# Patient Record
Sex: Male | Born: 1940 | Race: White | Hispanic: No | Marital: Single | Smoking: Never smoker
Health system: Southern US, Community
[De-identification: ages and names within clinical notes are randomized; demographics above are authoritative.]

## PROBLEM LIST (undated history)

## (undated) DIAGNOSIS — N183 Chronic kidney disease, stage 3 unspecified: Secondary | ICD-10-CM

## (undated) DIAGNOSIS — I2510 Atherosclerotic heart disease of native coronary artery without angina pectoris: Secondary | ICD-10-CM

## (undated) DIAGNOSIS — E785 Hyperlipidemia, unspecified: Secondary | ICD-10-CM

## (undated) DIAGNOSIS — I472 Ventricular tachycardia, unspecified: Secondary | ICD-10-CM

## (undated) DIAGNOSIS — Z9289 Personal history of other medical treatment: Secondary | ICD-10-CM

## (undated) DIAGNOSIS — E669 Obesity, unspecified: Secondary | ICD-10-CM

## (undated) DIAGNOSIS — D510 Vitamin B12 deficiency anemia due to intrinsic factor deficiency: Secondary | ICD-10-CM

## (undated) DIAGNOSIS — Z9889 Other specified postprocedural states: Secondary | ICD-10-CM

## (undated) DIAGNOSIS — M1990 Unspecified osteoarthritis, unspecified site: Secondary | ICD-10-CM

## (undated) DIAGNOSIS — K219 Gastro-esophageal reflux disease without esophagitis: Secondary | ICD-10-CM

## (undated) DIAGNOSIS — I493 Ventricular premature depolarization: Secondary | ICD-10-CM

## (undated) DIAGNOSIS — I10 Essential (primary) hypertension: Secondary | ICD-10-CM

## (undated) DIAGNOSIS — E7439 Other disorders of intestinal carbohydrate absorption: Secondary | ICD-10-CM

## (undated) HISTORY — DX: Personal history of other medical treatment: Z92.89

## (undated) HISTORY — DX: Vitamin B12 deficiency anemia due to intrinsic factor deficiency: D51.0

## (undated) HISTORY — DX: Ventricular tachycardia: I47.2

## (undated) HISTORY — DX: Other disorders of intestinal carbohydrate absorption: E74.39

## (undated) HISTORY — DX: Hyperlipidemia, unspecified: E78.5

## (undated) HISTORY — DX: Other specified postprocedural states: Z98.890

## (undated) HISTORY — DX: Unspecified osteoarthritis, unspecified site: M19.90

## (undated) HISTORY — DX: Ventricular premature depolarization: I49.3

## (undated) HISTORY — DX: Obesity, unspecified: E66.9

## (undated) HISTORY — DX: Ventricular tachycardia, unspecified: I47.20

## (undated) HISTORY — DX: Atherosclerotic heart disease of native coronary artery without angina pectoris: I25.10

## (undated) HISTORY — DX: Gastro-esophageal reflux disease without esophagitis: K21.9

## (undated) HISTORY — DX: Chronic kidney disease, stage 3 unspecified: N18.30

---

## 1998-07-16 ENCOUNTER — Encounter: Admission: RE | Admit: 1998-07-16 | Discharge: 1998-10-14 | Payer: Self-pay | Admitting: Family Medicine

## 2000-06-02 ENCOUNTER — Ambulatory Visit (HOSPITAL_BASED_OUTPATIENT_CLINIC_OR_DEPARTMENT_OTHER): Admission: RE | Admit: 2000-06-02 | Discharge: 2000-06-02 | Payer: Self-pay | Admitting: Orthopedic Surgery

## 2003-01-04 ENCOUNTER — Encounter: Admission: RE | Admit: 2003-01-04 | Discharge: 2003-01-04 | Payer: Self-pay | Admitting: Family Medicine

## 2003-01-19 ENCOUNTER — Ambulatory Visit (HOSPITAL_BASED_OUTPATIENT_CLINIC_OR_DEPARTMENT_OTHER): Admission: RE | Admit: 2003-01-19 | Discharge: 2003-01-19 | Payer: Self-pay | Admitting: Neurosurgery

## 2003-01-19 ENCOUNTER — Ambulatory Visit (HOSPITAL_COMMUNITY): Admission: RE | Admit: 2003-01-19 | Discharge: 2003-01-19 | Payer: Self-pay | Admitting: Neurosurgery

## 2003-06-18 ENCOUNTER — Ambulatory Visit (HOSPITAL_COMMUNITY): Admission: RE | Admit: 2003-06-18 | Discharge: 2003-06-18 | Payer: Self-pay | Admitting: Gastroenterology

## 2003-06-18 ENCOUNTER — Encounter (INDEPENDENT_AMBULATORY_CARE_PROVIDER_SITE_OTHER): Payer: Self-pay | Admitting: *Deleted

## 2003-06-18 HISTORY — PX: ESOPHAGOGASTRODUODENOSCOPY: SHX1529

## 2003-07-13 ENCOUNTER — Ambulatory Visit (HOSPITAL_COMMUNITY): Admission: RE | Admit: 2003-07-13 | Discharge: 2003-07-13 | Payer: Self-pay | Admitting: Gastroenterology

## 2003-08-21 ENCOUNTER — Ambulatory Visit (HOSPITAL_COMMUNITY): Admission: RE | Admit: 2003-08-21 | Discharge: 2003-08-21 | Payer: Self-pay | Admitting: Gastroenterology

## 2008-03-05 ENCOUNTER — Inpatient Hospital Stay (HOSPITAL_BASED_OUTPATIENT_CLINIC_OR_DEPARTMENT_OTHER): Admission: RE | Admit: 2008-03-05 | Discharge: 2008-03-05 | Payer: Self-pay | Admitting: *Deleted

## 2008-03-30 ENCOUNTER — Ambulatory Visit: Payer: Self-pay | Admitting: Internal Medicine

## 2008-05-24 DIAGNOSIS — F329 Major depressive disorder, single episode, unspecified: Secondary | ICD-10-CM | POA: Insufficient documentation

## 2008-05-24 DIAGNOSIS — R55 Syncope and collapse: Secondary | ICD-10-CM | POA: Insufficient documentation

## 2008-05-24 DIAGNOSIS — F3289 Other specified depressive episodes: Secondary | ICD-10-CM | POA: Insufficient documentation

## 2008-05-24 DIAGNOSIS — I493 Ventricular premature depolarization: Secondary | ICD-10-CM | POA: Insufficient documentation

## 2008-05-24 DIAGNOSIS — I4729 Other ventricular tachycardia: Secondary | ICD-10-CM | POA: Insufficient documentation

## 2008-05-24 DIAGNOSIS — I10 Essential (primary) hypertension: Secondary | ICD-10-CM | POA: Insufficient documentation

## 2008-05-24 DIAGNOSIS — E119 Type 2 diabetes mellitus without complications: Secondary | ICD-10-CM | POA: Insufficient documentation

## 2008-05-24 DIAGNOSIS — I472 Ventricular tachycardia: Secondary | ICD-10-CM | POA: Insufficient documentation

## 2008-05-24 DIAGNOSIS — I4949 Other premature depolarization: Secondary | ICD-10-CM | POA: Insufficient documentation

## 2008-05-28 ENCOUNTER — Encounter: Payer: Self-pay | Admitting: Internal Medicine

## 2008-05-28 ENCOUNTER — Ambulatory Visit: Payer: Self-pay | Admitting: Internal Medicine

## 2008-10-11 DIAGNOSIS — F0630 Mood disorder due to known physiological condition, unspecified: Secondary | ICD-10-CM | POA: Insufficient documentation

## 2008-11-08 DIAGNOSIS — N529 Male erectile dysfunction, unspecified: Secondary | ICD-10-CM | POA: Insufficient documentation

## 2008-11-12 DIAGNOSIS — D510 Vitamin B12 deficiency anemia due to intrinsic factor deficiency: Secondary | ICD-10-CM | POA: Insufficient documentation

## 2008-12-20 DIAGNOSIS — D508 Other iron deficiency anemias: Secondary | ICD-10-CM | POA: Insufficient documentation

## 2008-12-27 ENCOUNTER — Encounter: Admission: RE | Admit: 2008-12-27 | Discharge: 2008-12-27 | Payer: Self-pay | Admitting: Family Medicine

## 2009-02-04 DIAGNOSIS — M5412 Radiculopathy, cervical region: Secondary | ICD-10-CM | POA: Insufficient documentation

## 2009-02-04 DIAGNOSIS — R6882 Decreased libido: Secondary | ICD-10-CM | POA: Insufficient documentation

## 2009-02-04 DIAGNOSIS — M5030 Other cervical disc degeneration, unspecified cervical region: Secondary | ICD-10-CM | POA: Insufficient documentation

## 2009-07-16 DIAGNOSIS — D649 Anemia, unspecified: Secondary | ICD-10-CM | POA: Insufficient documentation

## 2009-10-22 DIAGNOSIS — J309 Allergic rhinitis, unspecified: Secondary | ICD-10-CM | POA: Insufficient documentation

## 2009-10-22 DIAGNOSIS — E119 Type 2 diabetes mellitus without complications: Secondary | ICD-10-CM | POA: Insufficient documentation

## 2010-03-18 NOTE — Assessment & Plan Note (Signed)
Summary: Karl Peters  Medications Added COREG 12.5 MG TABS (CARVEDILOL) Take 1 tablet twice a day       21  22  23  24  25  Allergies Added: NKDA  Visit Type:  Follow-up Visit Referring Provider:  Lady Deutscher, MD  CC:  PVCS.  History of Present Illness: [name] presents today for follow-up.  He has done very well since last being seen in our office.  He denies palpitations, dizziness, chest pain, SOB, nausea, presyncope, syncope, or any other symptoms of PVCs.  He feels that his PVC burden has significantly improved with coreg.  He is unaware of any further VT.  He is tolerating coreg without symptoms.  He is otherwise without complaint today.  Current Medications (verified): 1)  Avandamet 03-998 Mg Tabs (Rosiglitazone-Metformin) .Marland Kitchen.. 1 By Mouth Once Daily 2)  Crestor 20 Mg Tabs (Rosuvastatin Calcium) .... Take One Tablet By Mouth Daily. 3)  Bupropion Hcl 300 Mg Xr24h-Tab (Bupropion Hcl) .Marland Kitchen.. 1 By Mouth Once Daily 4)  Lisinopril 40 Mg Tabs (Lisinopril) .... Take One Tablet By Mouth Daily 5)  Furosemide 20 Mg Tabs (Furosemide) .... Take One Tablet By Mouth Two Times A Day 6)  Aspirin Ec 325 Mg Tbec (Aspirin) .... Take One Tablet By Mouth Daily 7)  Coreg 12.5 Mg Tabs (Carvedilol) .... Take 1 Tablet Twice A Day  Allergies (verified): No Known Drug Allergies  Past History:  Past Medical History:    1. 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 Hypertension.    3. Preserved ejection fraction.    4. Diabetes.    5. Vasovagal syncope.    6. Depression.   Family History:    Notable for coronary artery disease and congestive  heart failure.  He denies any family history of sudden cardiac death or  arrhythmias.   Social History:    The patient lives in [place] and works for [employer].  He denies tobacco, alcohol, or drug use.   Review of Systems       All systems are reviewed and negative except as listed in the HPI.   Vital  Signs:  Patient profile:   70 year old male Height:      68 inches Weight:      286 pounds BMI:     43.64 Pulse rate:   62 / minute Pulse rhythm:   regular BP sitting:   130 / 82  (left arm) Cuff size:   regular  Vitals Entered By: Flonnie Overman (May 28, 2008 9:52 AM)  Physical Exam  General:  Well developed, well nourished, in no acute distress. Head:  normocephalic and atraumatic Eyes:  PERRLA/EOM intact; conjunctiva and lids normal. Nose:  no deformity, discharge, inflammation, or lesions Mouth:  Teeth, gums and palate normal. Oral mucosa normal. Neck:  Neck supple, no JVD. No masses, thyromegaly or abnormal cervical nodes. Chest Wall:  no deformities or breast masses noted Lungs:  Clear bilaterally to auscultation and percussion. Heart:  Non-displaced PMI, chest non-tender; regular rate and rhythm, S1, S2 without murmurs, rubs or gallops. Carotid upstroke normal, no bruit. Normal abdominal aortic size, no bruits. Femorals normal pulses, no bruits. Pedals normal pulses. No edema, no varicosities. Abdomen:  Bowel sounds positive; abdomen soft and non-tender without masses, organomegaly, or hernias noted. No hepatosplenomegaly. Msk:  Back normal, normal gait. Muscle strength and tone normal. Pulses:  pulses normal in all 4 extremities Extremities:  No clubbing or cyanosis. Neurologic:  Alert and oriented x  3. Skin:  Intact without lesions or rashes. Cervical Nodes:  no significant adenopathy Psych:  Normal affect.   EKG  Procedure date:  05/28/2008  Findings:      Sinus rhythm at 62 bpm,  LAD, otherwise normal  Impression & Recommendations:  Problem # 1:  PREMATURE VENTRICULAR CONTRACTIONS (ICD-427.69) Mr Hevener presents today for follow-up of PVCS and nonsustained VT.  He has had no further symptomatic ventricular arrhythmias since last being seen in our office.  He is tolerating Coreg without difficulties.  No medication changes were made today.  No further EP workup or  therapies appear to be necessary at this time.  Problem # 2:  HYPERTENSION, UNSPECIFIED (ICD-401.9) The patients blood pressure is near goal.  No medication changes were made today.  Patient Instructions: 1)  The patient will resume follow-up with Dr Karl Peters.  He will contact my office if further difficulties with ventricular arrhythmias arise.

## 2010-06-02 LAB — POCT I-STAT GLUCOSE
Glucose, Bld: 90 mg/dL (ref 70–99)
Operator id: 141321

## 2010-07-01 NOTE — Letter (Signed)
March 30, 2008    Rosine Abe, M.D.  [address]  [place], [place] [address]   RE:  BRITIAN, SEABERG  MRN:  [id number]  /  DOB:  06/25/1940   Dear Hartley Barefoot:   It was my pleasure to see your patient Kristin Rossiter in electrophysiology  consultation today.  As you recall, he is a very pleasant 70 year old  gentleman with a history of diabetes, hypertension, and sleep apnea.  He  recently has developed symptomatic palpitations and has been diagnosed  with ventricular tachycardia.  He reports that for more than 8 years he  has had symptomatic palpitations, which are suggestive of PVCs.  He  describes skipped heartbeats followed by forceful beat.  These episodes  previously occurred only every 6 months of so.  Over the past few  months, however, he has had increasing frequency and duration of  palpitations.  He has also developed episodes of chest fluttering with  associated chest discomfort and weakness.  He denies presyncope or  syncope with these episodes.  He has had syncope in the past.  However,  these episodes seem clearly vasovagal in origin.  For example, he has  had syncope while giving blood in the past as well as during a bowel  movement in the setting of severe abdominal pain.  He was evaluated by  you and had a event monitor placed, which previously documented  premature ventricular contractions, nonsustained ventricular tachycardia  as well as one episode of sustained ventricular tachycardia with a cycle  length of 180 milliseconds.  He was initiated on carvedilol, with some  improvement in the frequency.  He presents today for further EP  consultation.  A left heart catheterization has been performed on  March 05, 2008, which revealed no coronary artery disease with a  preserved ejection fraction of 60%.   PAST MEDICAL HISTORY:  1. 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  2. Hypertension.  3. Preserved ejection fraction.  4. Diabetes.  5. Vasovagal syncope.  6. Depression.   ALLERGIES:  No known drug allergies.   MEDICATIONS:  1. Avandamet 2 mg daily.  2. Crestor 20 mg daily.  3. Carvedilol 6.25 mg b.i.d.  4. Wellbutrin XL 300 mg daily.  5. Lisinopril 40 mg daily.  6. Lasix 20 mg b.i.d.  7. Aspirin 325 mg daily.   SOCIAL HISTORY:  The patient lives in [place] and works for [employer].  He denies tobacco, alcohol, or drug use.   FAMILY HISTORY:  Notable for coronary artery disease and congestive  heart failure.  He denies any family history of sudden cardiac death or  arrhythmias.   REVIEW OF SYSTEMS:  All systems are reviewed and negative except as  outlined in the HPI above.   PHYSICAL EXAMINATION:  VITAL SIGNS:  Blood pressure 135/86, heart rate  67, respirations 18, and weight 280 pounds.  GENERAL:  The patient is a well-appearing male, in no acute distress.  He is alert and oriented x3.  HEENT:  Normocephalic and atraumatic.  Sclerae clear.  Conjunctivae  pink.  Oropharynx clear.  NECK:  Supple.  No thyromegaly, JVD, or bruits.  LUNGS:  Clear to auscultation bilaterally  HEART:  Regular rate and rhythm.  No murmurs, rubs, or gallops.  GI:  Soft, nontender, and nondistended.  Positive bowel sounds.  EXTREMITIES:  No clubbing, cyanosis, or edema.  NEUROLOGIC:  Strength and sensation are intact.  SKIN:  No ecchymosis or  lacerations.  MUSCULOSKELETAL:  No deformity or atrophy.  PSYCH:  Euthymic mood.  Full affect.   EKG performed on February 24, 2008 reveals sinus rhythm at 75 beats per  minute with left axis deviation.   IMPRESSION:  Mr. Kort is a pleasant 70 year old gentleman with a  preserved ejection fraction and no known coronary artery disease who  presents with symptomatic premature ventricular contractions and  ventricular tachycardia.  He has been initiated on Coreg with  significant improvement in his symptoms.  Holter monitor also documented  a decrease in the frequency  of his ventricular tachycardia.  I think  that the most prudent step therefore at this time would be to increase  his Coreg further to 12.5 mg twice daily.  If he continues to have  further symptomatic episodes of ventricular tachycardia then we could  consider catheter ablation.  Risks, benefits and alternatives to EP  study and radiofrequency ablation, for his ventricular tachycardia were  discussed in detail.  The patient would prefer a medicine strategy at  this time.   PLAN:  1. Carvedilol was increased 12.5 mg twice daily.  2. The patient will follow up with me in clinic in 2 months.    Sincerely,      [name], MD  Electronically Signed    JA/MedQ  DD: 03/30/2008  DT: 03/31/2008  Job #: [id number]

## 2010-07-01 NOTE — H&P (Signed)
NAME:  Karl Peters, Karl Peters NO.:  [id number]   MEDICAL RECORD NO.:  [id number]          PATIENT TYPE:  OIB   LOCATION:  1966                         FACILITY:  [facility]   PHYSICIAN:  Elmore Guise., M.D.DATE OF BIRTH:  1940-12-21   DATE OF ADMISSION:  03/05/2008  DATE OF DISCHARGE:  03/05/2008                              HISTORY & PHYSICAL   INDICATION FOR PROCEDURE:  Ventricular tachycardia.   HISTORY OF PRESENT ILLNESS:  Karl Peters is a very pleasant 70 year old  white male with past medical history of diabetes mellitus, hypertension,  and sleep apnea, who presented for evaluation of chest pain and  palpitations.  He had an echocardiogram performed, which showed normal  LV size and function with an EF of 60%.  He had mild to moderate left  ventricular hypertrophy.  He was then referred for Holter monitoring.  His Holter monitor was performed and showed episodes of ventricular  tachycardia with the longest run being 241 beats with a rate of 189  beats per minute.  The patient was started on Coreg 3.125 mg twice daily  with significant improvement in his symptoms.  Because of his  arrhythmias, he is now referred for cardiac catheterization to evaluate  for any significant ischemic heart disease.   DESCRIPTION OF PROCEDURE:  The patient presented to the Cardiac Cath  Lab.  After appropriate informed consent, he was prepped and draped in  the sterile fashion.  Approximately 5 mL of 1% lidocaine was used for  local anesthesia.  A 4-French sheath was placed in the right femoral  artery without difficulty.  Coronary angiography, LV angiography were  then performed.  The patient tolerated the procedure well and was  transferred from the Cardiac Cath Lab in stable condition.   FINDINGS:  1. Left main:  Normal.  2. LAD:  Normal.  3. LCX:  Codominant large vessel, normal appearing  4. High OM:  Large vessel, normal appearing  5. RCA:  Small to moderate size vessel,  codominant, normal appearing.  6. LV:  EF of 60%.  No wall motion abnormalities.  LVEDP is 25 mmHg.   IMPRESSION:  1. Normal-appearing coronary arteries with codominant system.  2. Preserved left ventricular systolic function with ejection fraction      of 60%.   PLAN:  We will continue his current therapy.  The patient does report  significant improvement in his palpitations and chest discomfort since  the addition of Coreg to his regimen.  I plan to repeat Holter in 1  week.  If he has no further ventricular arrhythmias, we will then  continue his Coreg.  If he does have further ventricular arrhythmias, we  will refer him to Dr. Johney Frame (electrophysiologist) for evaluation of  possible ablation.      Elmore Guise., M.D.  Electronically Signed     TWK/MEDQ  D:  03/05/2008  T:  03/05/2008  Job:  [id number]   cc:   Elizabeth Palau, FNP

## 2010-07-04 NOTE — Op Note (Signed)
NAME:  Karl Peters, Karl Peters                             ACCOUNT NO.:  [id number]   MEDICAL RECORD NO.:  [id number]                   PATIENT TYPE:  AMB   LOCATION:  DSC                                  FACILITY:  [facility]   PHYSICIAN:  Reinaldo Meeker, M.D.              DATE OF BIRTH:  Aug 30, 1940   DATE OF PROCEDURE:  01/19/2003  DATE OF DISCHARGE:                                 OPERATIVE REPORT   PREOPERATIVE DIAGNOSIS:  Right carpal tunnel syndrome.   POSTOPERATIVE DIAGNOSIS:  Right carpal tunnel syndrome.   PROCEDURE:  Right carpal tunnel release.   SURGEON:  Reinaldo Meeker, M.D.   PROCEDURE IN DETAIL:  After induction of regional anesthesia the patient's  hand, wrist, and forearm were prepped and draped in the usual sterile  fashion.  A curvilinear incision was made starting at the list in line with  the ring finger, heading up toward the fingers, then curving slightly in a  radial direction.  Subcutaneous fat was dissected.  Self-retaining  retractors were placed for exposure.  The transverse carpal ligament was  then incised.  Starting in a proximal to distal direction it was incised to  decompress the underlying median nerve, which was well-visualized.  A very  thorough decompression was carried out until there was no evidence of  further compression upon the nerve as it went through the carpal tunnel.  Irrigation was then carried out and the wound closed with interrupted Vicryl  on the subcutaneous tissue and interrupted nylons on the skin.  A sterile  dressing was then applied and the patient was taken to the recovery room in  stable condition.                                               Reinaldo Meeker, M.D.    ROK/MEDQ  D:  01/19/2003  T:  01/19/2003  Job:  [id number]

## 2010-07-04 NOTE — Op Note (Signed)
NAME:  Karl Peters, Karl Peters NO.:  [id number]   MEDICAL RECORD NO.:  [id number]                   PATIENT TYPE:  AMB   LOCATION:  ENDO                                 FACILITY:  [facility]   PHYSICIAN:  Anselmo Rod, M.D.               DATE OF BIRTH:  01/21/1941   DATE OF PROCEDURE:  06/18/2003  DATE OF DISCHARGE:                                 OPERATIVE REPORT   PROCEDURE PERFORMED:  Esophagogastroduodenoscopy with biopsies.   ENDOSCOPIST:  Charna Elizabeth, M.D.   INSTRUMENT USED:  Olympus video panendoscope.   INDICATIONS FOR PROCEDURE:  This is a 70 year old white male with a history  of iron deficiency anemia, hemoglobin down to 9.9 g/dl.  Rule out peptic  ulcer disease, esophagitis, gastritis, etc.   PROCEDURE PREPARATION:  Informed consent was procured from the patient.  The  patient was fasted for eight hours prior to the procedure.   PREPROCEDURE PHYSICAL EXAMINATION:  VITAL SIGNS:  The patient has stable  vital signs.  NECK:  Supple.  CHEST:  Clear to auscultation.  HEART:  S1 and S2 regular.  ABDOMEN:  Soft with normal bowel sounds.   DESCRIPTION OF PROCEDURE:  The patient was placed in the left lateral  decubitus position and sedated with 60 mg of Demerol and 6 mg of Versed  intravenously.  Once the patient was adequately sedated and maintained on  low flow oxygen and continuous cardiac monitoring, the Olympus video  panendoscope was advanced through the mouthpiece, over the tongue, into the  esophagus under direct vision.  The entire esophagus appeared normal with no  evidence of ring, stricture, masses, esophagitis or Barrett's mucosa.  A  small nodule was biopsied from the mid body of the stomach x1.  There was  some bleeding from the site, and therefore, no further biopsies were done.  Small bowel biopsies were done to rule out sprue considering the patient's  history of iron deficiency anemia and no lesions were noted in the small  bowel.   No ulcers, erosions, masses, or polyps were identified.  Retroflexion in the high cardia revealed no further masses.   IMPRESSION:  1. Normal-appearing esophagus and proximal small bowel.  2. Small nodular biopsy from the stomach x1.  3. Small bowel biopsy to rule out sprue.   RECOMMENDATIONS:  1. Await pathology results.  2. Avoid nonsteroidals for now.  3. Proceed with colonoscopy at this time.  4. Further recommendations will be made in followup.                                               Anselmo Rod, M.D.    JNM/MEDQ  D:  06/18/2003  T:  06/18/2003  Job:  [id number]   cc:   Marjory Lies,  M.D.  [address]  [place]  [place] [address]  Fax: [phone]

## 2010-07-04 NOTE — Op Note (Signed)
NAME:  Karl Peters, Karl Peters NO.:  [id number]   MEDICAL RECORD NO.:  [id number]                   PATIENT TYPE:  AMB   LOCATION:  ENDO                                 FACILITY:  [facility]   PHYSICIAN:  Anselmo Rod, M.D.               DATE OF BIRTH:  March 11, 1940   DATE OF PROCEDURE:  06/18/2003  DATE OF DISCHARGE:                                 OPERATIVE REPORT   PROCEDURE:  Colonoscopy.   ENDOSCOPIST:  Charna Elizabeth, M.D.   INSTRUMENT USED:  Olympus video colonoscope.   INDICATIONS FOR PROCEDURE:  Iron deficiency anemia in a 70 year old white  male.  Rule out colonic polyps, masses, etc.   PROCEDURE PERFORMED:  Informed consent was procured from the patient.  The  patient fasted for eight hours prior to the procedure and prepped with a  bottle of magnesium citrate and a gallon of GOLYTELY the night prior to the  procedure.   PREPROCEDURE PHYSICAL EXAMINATION:  VITAL SIGNS:  The patient had stable  vital signs.  NECK:  Supple.  CHEST:  Clear to auscultation.  HEART:  S1 and S2 regular.  ABDOMEN:  Soft with normal bowel sounds.   DESCRIPTION OF PROCEDURE:  The patient was placed in the left lateral  decubitus position.  No additional sedation was used after the EGD.  Once  the patient was adequately sedated and maintained on low flow oxygen and  continuous cardiac monitoring, the Olympus video colonoscope was advanced  from the rectum to the cecum.  The appendiceal orifice and ileocecal valve  were clearly visualized and photographed.  The terminal ileum appeared  healthy without lesions.  No masses, polyps, erosions or diverticula were  seen.  There was no evidence of hemorrhoids on retroflexion.  The patient  tolerated the procedure well without any complications.   IMPRESSION:  Normal colonoscopy to the terminal ileum. No masses, polyps,  diverticula or hemorrhoids noted.   RECOMMENDATIONS:  1. Check CBC today.  2. Enteroclysis to rule out small  bowel lesions.  3. Outpatient followup in the next two weeks; earlier if need be.                                               Anselmo Rod, M.D.    JNM/MEDQ  D:  06/18/2003  T:  06/18/2003  Job:  [id number]   cc:   Marjory Lies, M.D.  [address]  [place]  [place] [address]  Fax: [phone]

## 2010-07-04 NOTE — Op Note (Signed)
[place]  Patient:    ISHMEL, ACEVEDO                          MRN: [id number]. Date: 06/02/00 Adm. 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  PROCEDURE: 1. Release A1 pulley right thumb. 2. Inject CMC joint with Kenalog and Marcaine.  SURGEON:  Artist Pais. Mina Marble, M.D.  ASSISTANT:  Junius Roads. Ireton, P.A.C.  ANESTHESIA:  Bier block.  TOURNIQUET TIME:  20 minutes.  COMPLICATIONS:  None.  DRAINS:  None.  OPERATIVE REPORT:  The patient was taken to the operating room where after the induction of adequate Bier block analgesia the right upper extremity was prepped and draped in the usual sterile fashion.  An Esmarch was used to exsanguinate the limb, tourniquet inflated to 250 mmHg.  At this point in time a 2 cm incision was made in the palmar aspect of the hand in the area of the MP flexion crease of the thumb.  The incision was taken down through the skin and subcutaneous tissues with care to carefully identify and retract the radial and ulnar neurovascular bundles.  Once this was done the A1 pulley was identified, split longitudinally with a 15 blade, thus freeing up the FPL tendon.  The FPL tendon was lysed of all adhesions and the wound was throught irrigated and closed with 4-0 nylon in a combination of simple and horizontal mattress sutures.  Sterile dressing of Xeroform, 4x4s, fluffs was applied. Prior to the wound closure and application of dressing, the [facility] joint was injected with 1 cc of Marcaine and 1 cc of Kenalog 40.  The patient tolerated the procedure well, went to recovery in stable fashion. DD:  06/02/00 TD:  06/02/00 Job: [id number]

## 2010-11-12 DIAGNOSIS — E349 Endocrine disorder, unspecified: Secondary | ICD-10-CM | POA: Insufficient documentation

## 2011-06-30 ENCOUNTER — Encounter: Payer: Self-pay | Admitting: *Deleted

## 2011-08-28 DIAGNOSIS — R110 Nausea: Secondary | ICD-10-CM | POA: Insufficient documentation

## 2011-10-26 ENCOUNTER — Encounter: Payer: Self-pay | Admitting: *Deleted

## 2012-06-09 ENCOUNTER — Encounter (HOSPITAL_COMMUNITY): Payer: Self-pay | Admitting: *Deleted

## 2012-06-09 ENCOUNTER — Emergency Department (HOSPITAL_COMMUNITY): Payer: Medicare Other

## 2012-06-09 ENCOUNTER — Observation Stay (HOSPITAL_COMMUNITY)
Admission: EM | Admit: 2012-06-09 | Discharge: 2012-06-11 | Disposition: A | Discharge status: Home / Self Care | Payer: Medicare Other | Attending: Internal Medicine | Admitting: Internal Medicine

## 2012-06-09 DIAGNOSIS — R079 Chest pain, unspecified: Secondary | ICD-10-CM

## 2012-06-09 DIAGNOSIS — I493 Ventricular premature depolarization: Secondary | ICD-10-CM

## 2012-06-09 DIAGNOSIS — I10 Essential (primary) hypertension: Secondary | ICD-10-CM

## 2012-06-09 DIAGNOSIS — I4729 Other ventricular tachycardia: Secondary | ICD-10-CM

## 2012-06-09 DIAGNOSIS — Z6841 Body Mass Index (BMI) 40.0 and over, adult: Secondary | ICD-10-CM | POA: Insufficient documentation

## 2012-06-09 DIAGNOSIS — E119 Type 2 diabetes mellitus without complications: Secondary | ICD-10-CM

## 2012-06-09 DIAGNOSIS — I472 Ventricular tachycardia, unspecified: Principal | ICD-10-CM | POA: Insufficient documentation

## 2012-06-09 DIAGNOSIS — E785 Hyperlipidemia, unspecified: Secondary | ICD-10-CM | POA: Insufficient documentation

## 2012-06-09 DIAGNOSIS — A088 Other specified intestinal infections: Secondary | ICD-10-CM | POA: Insufficient documentation

## 2012-06-09 DIAGNOSIS — E669 Obesity, unspecified: Secondary | ICD-10-CM | POA: Insufficient documentation

## 2012-06-09 DIAGNOSIS — I519 Heart disease, unspecified: Secondary | ICD-10-CM | POA: Insufficient documentation

## 2012-06-09 DIAGNOSIS — R0789 Other chest pain: Secondary | ICD-10-CM | POA: Diagnosis present

## 2012-06-09 DIAGNOSIS — I959 Hypotension, unspecified: Secondary | ICD-10-CM | POA: Diagnosis present

## 2012-06-09 HISTORY — DX: Essential (primary) hypertension: I10

## 2012-06-09 LAB — CBC WITH DIFFERENTIAL/PLATELET
Basophils Absolute: 0 10*3/uL (ref 0.0–0.1)
Basophils Relative: 0 % (ref 0–1)
Eosinophils Absolute: 0 10*3/uL (ref 0.0–0.7)
Eosinophils Relative: 0 % (ref 0–5)
HCT: 39.9 % (ref 39.0–52.0)
Hemoglobin: 12.8 g/dL — ABNORMAL LOW (ref 13.0–17.0)
Lymphocytes Relative: 10 % — ABNORMAL LOW (ref 12–46)
Lymphs Abs: 0.8 10*3/uL (ref 0.7–4.0)
MCH: 23.3 pg — ABNORMAL LOW (ref 26.0–34.0)
MCHC: 32.1 g/dL (ref 30.0–36.0)
MCV: 72.5 fL — ABNORMAL LOW (ref 78.0–100.0)
Monocytes Absolute: 0.5 10*3/uL (ref 0.1–1.0)
Monocytes Relative: 6 % (ref 3–12)
Neutro Abs: 6.9 10*3/uL (ref 1.7–7.7)
Neutrophils Relative %: 84 % — ABNORMAL HIGH (ref 43–77)
Platelets: 273 10*3/uL (ref 150–400)
RBC: 5.5 MIL/uL (ref 4.22–5.81)
RDW: 18.2 % — ABNORMAL HIGH (ref 11.5–15.5)
WBC: 8.3 10*3/uL (ref 4.0–10.5)

## 2012-06-09 LAB — POCT I-STAT, CHEM 8
BUN: 16 mg/dL (ref 6–23)
Calcium, Ion: 1.12 mmol/L — ABNORMAL LOW (ref 1.13–1.30)
Chloride: 104 mEq/L (ref 96–112)
Creatinine, Ser: 1.1 mg/dL (ref 0.50–1.35)
Glucose, Bld: 143 mg/dL — ABNORMAL HIGH (ref 70–99)
HCT: 43 % (ref 39.0–52.0)
Hemoglobin: 14.6 g/dL (ref 13.0–17.0)
Potassium: 3.9 mEq/L (ref 3.5–5.1)
Sodium: 139 mEq/L (ref 135–145)
TCO2: 23 mmol/L (ref 0–100)

## 2012-06-09 LAB — URINALYSIS, ROUTINE W REFLEX MICROSCOPIC
Bilirubin Urine: NEGATIVE
Glucose, UA: NEGATIVE mg/dL
Hgb urine dipstick: NEGATIVE
Ketones, ur: NEGATIVE mg/dL
Leukocytes, UA: NEGATIVE
Nitrite: NEGATIVE
Protein, ur: NEGATIVE mg/dL
Specific Gravity, Urine: 1.02 (ref 1.005–1.030)
Urobilinogen, UA: 0.2 mg/dL (ref 0.0–1.0)
pH: 5 (ref 5.0–8.0)

## 2012-06-09 LAB — RAPID URINE DRUG SCREEN, HOSP PERFORMED
Amphetamines: NOT DETECTED
Barbiturates: NOT DETECTED
Benzodiazepines: NOT DETECTED
Cocaine: NOT DETECTED
Opiates: NOT DETECTED
Tetrahydrocannabinol: NOT DETECTED

## 2012-06-09 LAB — PROTIME-INR
INR: 1.05 (ref 0.00–1.49)
Prothrombin Time: 13.6 seconds (ref 11.6–15.2)

## 2012-06-09 LAB — TROPONIN I
Troponin I: <0.3 ng/mL (ref <0.30)
Troponin I: <0.3 ng/mL (ref <0.30)

## 2012-06-09 LAB — GLUCOSE, CAPILLARY: Glucose-Capillary: 127 mg/dL — ABNORMAL HIGH (ref 70–99)

## 2012-06-09 LAB — APTT: aPTT: 32 seconds (ref 24–37)

## 2012-06-09 MED ORDER — ONDANSETRON HCL 4 MG PO TABS
4.0000 mg | ORAL_TABLET | Freq: Four times a day (QID) | ORAL | Status: DC | PRN
  Administered 2012-06-10: 4 mg via ORAL
  Filled 2012-06-09: qty 1

## 2012-06-09 MED ORDER — FUROSEMIDE 20 MG PO TABS
20.0000 mg | ORAL_TABLET | Freq: Every day | ORAL | Status: DC
  Administered 2012-06-10 – 2012-06-11 (×2): 20 mg via ORAL
  Filled 2012-06-09 (×3): qty 1

## 2012-06-09 MED ORDER — ENOXAPARIN SODIUM 40 MG/0.4ML ~~LOC~~ SOLN
40.0000 mg | SUBCUTANEOUS | Status: DC
  Administered 2012-06-09 – 2012-06-10 (×2): 40 mg via SUBCUTANEOUS
  Filled 2012-06-09 (×3): qty 0.4

## 2012-06-09 MED ORDER — ACETAMINOPHEN 650 MG RE SUPP: 650.0000 mg | Freq: Four times a day (QID) | RECTAL | Status: DC | PRN

## 2012-06-09 MED ORDER — SODIUM CHLORIDE 0.9 % IJ SOLN
3.0000 mL | Freq: Two times a day (BID) | INTRAMUSCULAR | Status: DC
  Administered 2012-06-09 – 2012-06-11 (×4): 3 mL via INTRAVENOUS

## 2012-06-09 MED ORDER — ASPIRIN 325 MG PO TABS
325.0000 mg | ORAL_TABLET | Freq: Every day | ORAL | Status: DC
  Administered 2012-06-09 – 2012-06-11 (×3): 325 mg via ORAL
  Filled 2012-06-09 (×3): qty 1

## 2012-06-09 MED ORDER — ACETAMINOPHEN 325 MG PO TABS: 650.0000 mg | ORAL_TABLET | Freq: Four times a day (QID) | ORAL | Status: DC | PRN

## 2012-06-09 MED ORDER — PANTOPRAZOLE SODIUM 40 MG PO TBEC
40.0000 mg | DELAYED_RELEASE_TABLET | Freq: Every day | ORAL | Status: DC
Start: 2012-06-09 — End: 2012-06-11
  Administered 2012-06-09 – 2012-06-11 (×3): 40 mg via ORAL
  Filled 2012-06-09 (×3): qty 1

## 2012-06-09 MED ORDER — IOHEXOL 350 MG/ML SOLN
100.0000 mL | Freq: Once | INTRAVENOUS | Status: AC | PRN
  Administered 2012-06-09: 100 mL via INTRAVENOUS

## 2012-06-09 MED ORDER — SODIUM CHLORIDE 0.9 % IV SOLN
Freq: Once | INTRAVENOUS | Status: AC
  Administered 2012-06-09: 12:00:00 via INTRAVENOUS

## 2012-06-09 MED ORDER — SODIUM CHLORIDE 0.9 % IV SOLN: INTRAVENOUS | Status: DC

## 2012-06-09 MED ORDER — INSULIN ASPART 100 UNIT/ML ~~LOC~~ SOLN
0.0000 [IU] | Freq: Three times a day (TID) | SUBCUTANEOUS | Status: DC
  Administered 2012-06-09 – 2012-06-11 (×3): 1 [IU] via SUBCUTANEOUS

## 2012-06-09 MED ORDER — BUPROPION HCL ER (XL) 300 MG PO TB24
300.0000 mg | ORAL_TABLET | Freq: Every day | ORAL | Status: DC
  Administered 2012-06-09 – 2012-06-11 (×3): 300 mg via ORAL
  Filled 2012-06-09 (×3): qty 1

## 2012-06-09 MED ORDER — ONDANSETRON HCL 4 MG/2ML IJ SOLN
4.0000 mg | Freq: Once | INTRAMUSCULAR | Status: AC
  Administered 2012-06-09: 4 mg via INTRAVENOUS
  Filled 2012-06-09: qty 2

## 2012-06-09 MED ORDER — ONDANSETRON HCL 4 MG/2ML IJ SOLN
4.0000 mg | Freq: Four times a day (QID) | INTRAMUSCULAR | Status: DC | PRN
  Administered 2012-06-10 (×2): 4 mg via INTRAVENOUS
  Filled 2012-06-09 (×2): qty 2

## 2012-06-09 MED ORDER — ATORVASTATIN CALCIUM 40 MG PO TABS
40.0000 mg | ORAL_TABLET | Freq: Every day | ORAL | Status: DC
  Administered 2012-06-10 – 2012-06-11 (×2): 40 mg via ORAL
  Filled 2012-06-09 (×3): qty 1

## 2012-06-09 NOTE — ED Notes (Signed)
Pt states, "I feel like I can pass out." BP 72/46, manual BP obtained 72/58, Karl Meeker, MD notified at bed, 6 min EKG strip printed & shown to MD, pt placed in stretcher with head down, pt receiving NS bolus

## 2012-06-09 NOTE — ED Provider Notes (Signed)
History     CSN: [id number]  Arrival date & time 06/09/12  1115   First MD Initiated Contact with Patient 06/09/12 1117      Chief Complaint  Patient presents with  . Chest Pain    (Consider location/radiation/quality/duration/timing/severity/associated sxs/prior treatment) HPI Comments: 72 year old male with history of hypertension, diabetes, hypercholesterolemia who takes 325 mg of aspirin a day and is not a smoker. He presents with a complaint of chest pain which started last night at 9:30 when he was going to bed, associated with nausea feeling like he had to vomit several times but did not get sweaty, has no radiation of the symptoms, no shortness of breath cough abdominal pain back pain or swelling of his legs. He states that he has had palpitations intermittently in the past and was told he would benefit from an ablation, deferred at that time. He did have palpitations last night. He went to his primary doctor's office this morning, had an EKG which she was told was abnormal and changed compared to prior EKG per the nurse practitioner Ms. Anderson at that office, he was transferred to the emergency department by ambulance. The symptoms resolved in the ambulance and at this time he is symptom-free. He feels like his chest pressure was a heaviness in his sternal area, worse with laying down, better with sitting up, not associated with exertion. He also reports a normal heart catheterization within the last 5 years.  Patient is a 72 y.o. male presenting with chest pain. The history is provided by the patient and the EMS personnel.  Chest Pain   Past Medical History  Diagnosis Date  . Ventricular tachycardia   . Normal coronary arteries   . Dyslipidemia   . Diabetes mellitus   . Obesity   . Hypertension     No past surgical history on file.  Family History  Problem Relation Age of Onset  . Heart attack Father     Died at age 45 from heart attack  . Heart failure Mother    Died from heart failure    History  Substance Use Topics  . Smoking status: Never Smoker   . Smokeless tobacco: Not on file  . Alcohol Use: No      Review of Systems  Cardiovascular: Positive for chest pain.  All other systems reviewed and are negative.    Allergies  Review of patient's allergies indicates no known allergies.  Home Medications   Current Outpatient Rx  Name  Route  Sig  Dispense  Refill  . amLODipine (NORVASC) 5 MG tablet   Oral   Take 5 mg by mouth daily.         Marland Kitchen aspirin 325 MG tablet   Oral   Take 325 mg by mouth daily.         Marland Kitchen buPROPion (WELLBUTRIN XL) 300 MG 24 hr tablet   Oral   Take 300 mg by mouth daily.         . furosemide (LASIX) 20 MG tablet   Oral   Take 20 mg by mouth daily.          . metFORMIN (GLUCOPHAGE) 1000 MG tablet   Oral   Take 1,000 mg by mouth daily with breakfast.         . pantoprazole (PROTONIX) 40 MG tablet   Oral   Take 40 mg by mouth daily.         . rosuvastatin (CRESTOR) 20 MG tablet   Oral  Take 20 mg by mouth daily.           BP 127/68  Pulse 83  Temp(Src) 98.9 F (37.2 C) (Oral)  Resp 20  SpO2 98%  Physical Exam  Nursing note and vitals reviewed. Constitutional: He appears well-developed and well-nourished. No distress.  HENT:  Head: Normocephalic and atraumatic.  Mouth/Throat: Oropharynx is clear and moist. No oropharyngeal exudate.  Eyes: Conjunctivae and EOM are normal. Pupils are equal, round, and reactive to light. Right eye exhibits no discharge. Left eye exhibits no discharge. No scleral icterus.  Neck: Normal range of motion. Neck supple. No JVD present. No thyromegaly present.  Cardiovascular: Normal rate, regular rhythm, normal heart sounds and intact distal pulses.  Exam reveals no gallop and no friction rub.   No murmur heard. Pulmonary/Chest: Effort normal and breath sounds normal. No respiratory distress. He has no wheezes. He has no rales.  Abdominal: Soft.  Bowel sounds are normal. He exhibits no distension and no mass. There is no tenderness.  Musculoskeletal: Normal range of motion. He exhibits no edema and no tenderness.  Lymphadenopathy:    He has no cervical adenopathy.  Neurological: He is alert. Coordination normal.  Skin: Skin is warm and dry. No rash noted. No erythema.  Psychiatric: He has a normal mood and affect. His behavior is normal.    ED Course  Procedures (including critical care time)  Labs Reviewed  CBC WITH DIFFERENTIAL - Abnormal; Notable for the following:    Hemoglobin 12.8 (*)    MCV 72.5 (*)    MCH 23.3 (*)    RDW 18.2 (*)    Neutrophils Relative 84 (*)    Lymphocytes Relative 10 (*)    All other components within normal limits  POCT I-STAT, CHEM 8 - Abnormal; Notable for the following:    Glucose, Bld 143 (*)    Calcium, Ion 1.12 (*)    All other components within normal limits  APTT  PROTIME-INR  TROPONIN I   Ct Angio Chest Pe W/cm &/or Wo Cm  06/09/2012  *RADIOLOGY REPORT*  Clinical Data: Chest pain  CT ANGIOGRAPHY CHEST  Technique:  Multidetector CT imaging of the chest using the standard protocol during bolus administration of intravenous contrast. Multiplanar reconstructed images including MIPs were obtained and reviewed to evaluate the vascular anatomy.  Contrast: OMNIPAQUE IOHEXOL 350 MG/ML SOLN  Comparison: None.  Findings: There are no filling defects in the pulmonary arterial tree to suggest acute pulmonary thromboembolism.  Opacification of the aorta is limited.  No obvious aortic dissection or intramural hematoma.  No evidence of aortic aneurysm.  Mild atherosclerotic calcifications.  This involves the aortic arch and the left anterior descending coronary artery.  No evidence of abnormal adenopathy.  No pericardial effusion.  No pleural effusion.  No pneumothorax.  3 mm subpleural nodule in the lingula on image 51.  No acute bony deformity. Left paracentral posterior T7-T8 disc osteophytes do  encroach upon the central canal.  IMPRESSION: No evidence of acute pulmonary thromboembolism.   Original Report Authenticated By: Jolaine Click, M.D.    Dg Chest Port 1 View  06/09/2012  *RADIOLOGY REPORT*  Clinical Data: Chest pain.  PORTABLE CHEST - 1 VIEW  Comparison: None.  Findings: The heart size is normal.  The lung volumes are low. Mild bibasilar atelectasis is evident.  No significant airspace consolidation is present.  The visualized soft tissues and bony thorax are unremarkable.  IMPRESSION:  1.  Low lung volumes and mild bibasilar  atelectasis. 2.  No other acute cardiopulmonary disease.   Original Report Authenticated By: Marin Roberts, M.D.      1. Hypotension   2. Chest pain       MDM  The patient appears well, his EKG shows left axis deviation with left anterior fascicular block, no signs of ischemia ST or T wave changes. Compared to 02/24/2008, no significant changes. We'll proceed with cardiac rule out with cardiac enzymes, vital signs are unremarkable other than a temperature of 100.1. He has no other infectious symptoms.  ED ECG REPORT  I personally interpreted this EKG   Date: 06/09/2012   Rate: 91  Rhythm: normal sinus rhythm  QRS Axis: left  Intervals: normal  ST/T Wave abnormalities: normal  Conduction Disutrbances:none  Narrative Interpretation:   Old EKG Reviewed: Compared with January eighth 2010, no significant changes  At 1:00 PM the patient became acutely diaphoretic and had recurrent chest discomfort. Again he does not feel like his radiates to his back or shoulder, he has no focal neurologic deficits and no dyspnea. On reexamination he is diaphoretic, clear heart and lung sounds with no signs of arrhythmia but is hypotensive to 79/50 systolic over diastolic. His EKG repeated shows no changes, no signs of ischemia and normal rhythm without any missed beats or arrhythmias. I will send him for a CT scan of the chest rule out an aortic dissection or pulmonary  embolism. IV fluids have been opened and a bolus has been given a support his blood pressure, the patient is in Trendelenburg.  ED ECG REPORT  I personally interpreted this EKG   Date: 06/09/2012 1325 PM  Rate: 82  Rhythm: normal sinus rhythm  QRS Axis: left  Intervals: normal  ST/T Wave abnormalities: normal  Conduction Disutrbances:none  Narrative Interpretation:   Old EKG Reviewed: unchanged    D/w IM teaching service resident who will see in ED for admission.  Hypotension improving with fluids.  Vida Roller, MD 06/09/12 1500

## 2012-06-09 NOTE — Consult Note (Signed)
Cardiology Consult Note No primary provider on file. No ref. provider found  Reason for consult:  History of Present Illness (and review of medical records): Karl Peters is a 72 y.o. male who presents for evaluation of chest discomfort.  He is a pleasant white male with past medical history of diabetes mellitus, hypertension, and sleep apnea, who presented for evaluation of chest pain and palpitations.  He has had similar episode before back in 2010.  At that time he had an echocardiogram performed, which showed normal LV size and function with an EF of 60%. He had mild to moderate left ventricular hypertrophy. He was then referred for Holter monitoring. His Holter monitor was performed and showed episodes of ventricular  tachycardia with the longest run being 241 beats with a rate of 189 beats per minute.  He underwent cardiac cath which revealed normal coronary arteries.  He was started on Coreg 3.125 mg twice daily with significant improvement in his symptoms and seen by Dr. Johney Frame in follow up with no plans for further therapy at that time.  He has had no admissions for cardiac related issues since that time.  Yesterday after eating, he had mid-sternal pressure which felt like indigestion and nausea.  He had return of similar symptoms tonight in the hospital after eating which lasted two hours and resolved.  He was unable to sleep due to palpitations last night.  He states symptoms related to palpitations are mild and occur maybe once a week. He saw his PCP today and was sent to ED for further evaluation.  He has not had chest pain, shortness of breath, presyncope or syncope.  He is compliant with medications.  It appears he is currently on Lasix and CCB.  Review of Systems A comprehensive review of systems was negative other than stated in HPI. Patient Active Problem List   Diagnosis Date Noted  . Hypotension 06/09/2012  . Chest pressure 06/09/2012  . DM 05/24/2008  . DEPRESSION  05/24/2008  . HYPERTENSION, UNSPECIFIED 05/24/2008  . VENTRICULAR TACHYCARDIA 05/24/2008  . PREMATURE VENTRICULAR CONTRACTIONS 05/24/2008  . VASOVAGAL SYNCOPE 05/24/2008   Past Medical History  Diagnosis Date  . Ventricular tachycardia   . Normal coronary arteries   . Dyslipidemia   . Diabetes mellitus   . Obesity   . Hypertension     No past surgical history on file.  Prescriptions prior to admission  Medication Sig Dispense Refill  . amLODipine (NORVASC) 5 MG tablet Take 5 mg by mouth daily.      Marland Kitchen aspirin 325 MG tablet Take 325 mg by mouth daily.      Marland Kitchen buPROPion (WELLBUTRIN XL) 300 MG 24 hr tablet Take 300 mg by mouth daily.      . furosemide (LASIX) 20 MG tablet Take 20 mg by mouth daily.       . metFORMIN (GLUCOPHAGE) 1000 MG tablet Take 1,000 mg by mouth daily with breakfast.      . pantoprazole (PROTONIX) 40 MG tablet Take 40 mg by mouth daily.      . rosuvastatin (CRESTOR) 20 MG tablet Take 20 mg by mouth daily.       No Known Allergies  History  Substance Use Topics  . Smoking status: Never Smoker   . Smokeless tobacco: Not on file  . Alcohol Use: No    Family History  Problem Relation Age of Onset  . Heart attack Father     Died at age 25 from heart attack  .  Heart failure Mother     Died from heart failure     Objective: Patient Vitals for the past 8 hrs:  BP Temp Temp src Pulse Resp SpO2 Height  06/09/12 2030 145/80 mmHg 100.1 F (37.8 C) - 90 20 99 % -  06/09/12 1816 158/93 mmHg 99.3 F (37.4 C) Oral 86 18 98 % 5\' 8"  (1.727 m)  06/09/12 1730 - - - 83 23 99 % -  06/09/12 1715 - - - 76 17 98 % -  06/09/12 1600 124/72 mmHg - - 79 14 97 % -  06/09/12 1545 - - - 80 16 99 % -  06/09/12 1500 - - - 83 18 99 % -  06/09/12 1423 127/68 mmHg 98.9 F (37.2 C) Oral 83 20 98 % -  06/09/12 1330 112/58 mmHg - - 78 15 94 % -   General Appearance:    Alert, cooperative, no distress, obese male  Head:    Normocephalic, without obvious abnormality,  Eyes:      Anicteric sclerae  Neck:   Supple, no carotid bruit or JVD  Lungs:     Clear to auscultation bilaterally, respirations unlabored  Heart:    Regular rate and rhythm, S1 and S2 normal, no murmurs  Abdomen:     Soft, non-tender, normoactive bowel sounds  Extremities:   Extremities normal, atraumatic, no cyanosis or edema  Pulses:   2+ and symmetric all extremities  Skin:   no rashes or lesions  Neurologic:   No focal deficits. AAO x3   Results for orders placed during the hospital encounter of 06/09/12 (from the past 48 hour(s))  CBC WITH DIFFERENTIAL     Status: Abnormal   Collection Time    06/09/12 12:05 PM      Result Value Range   WBC 8.3  4.0 - 10.5 K/uL   RBC 5.50  4.22 - 5.81 MIL/uL   Hemoglobin 12.8 (*) 13.0 - 17.0 g/dL   HCT 30.8  65.7 - 84.6 %   MCV 72.5 (*) 78.0 - 100.0 fL   MCH 23.3 (*) 26.0 - 34.0 pg   MCHC 32.1  30.0 - 36.0 g/dL   RDW 96.2 (*) 95.2 - 84.1 %   Platelets 273  150 - 400 K/uL   Neutrophils Relative 84 (*) 43 - 77 %   Neutro Abs 6.9  1.7 - 7.7 K/uL   Lymphocytes Relative 10 (*) 12 - 46 %   Lymphs Abs 0.8  0.7 - 4.0 K/uL   Monocytes Relative 6  3 - 12 %   Monocytes Absolute 0.5  0.1 - 1.0 K/uL   Eosinophils Relative 0  0 - 5 %   Eosinophils Absolute 0.0  0.0 - 0.7 K/uL   Basophils Relative 0  0 - 1 %   Basophils Absolute 0.0  0.0 - 0.1 K/uL  APTT     Status: None   Collection Time    06/09/12 12:05 PM      Result Value Range   aPTT 32  24 - 37 seconds  PROTIME-INR     Status: None   Collection Time    06/09/12 12:05 PM      Result Value Range   Prothrombin Time 13.6  11.6 - 15.2 seconds   INR 1.05  0.00 - 1.49  TROPONIN I     Status: None   Collection Time    06/09/12 12:05 PM      Result Value Range   Troponin I <  0.30  <0.30 ng/mL   Comment:            Due to the release kinetics of cTnI,     a negative result within the first hours     of the onset of symptoms does not rule out     myocardial infarction with certainty.     If myocardial  infarction is still suspected,     repeat the test at appropriate intervals.  POCT I-STAT, CHEM 8     Status: Abnormal   Collection Time    06/09/12 12:26 PM      Result Value Range   Sodium 139  135 - 145 mEq/L   Potassium 3.9  3.5 - 5.1 mEq/L   Chloride 104  96 - 112 mEq/L   BUN 16  6 - 23 mg/dL   Creatinine, Ser 1.61  0.50 - 1.35 mg/dL   Glucose, Bld 096 (*) 70 - 99 mg/dL   Calcium, Ion 0.45 (*) 1.13 - 1.30 mmol/L   TCO2 23  0 - 100 mmol/L   Hemoglobin 14.6  13.0 - 17.0 g/dL   HCT 40.9  81.1 - 91.4 %  URINALYSIS, ROUTINE W REFLEX MICROSCOPIC     Status: None   Collection Time    06/09/12  4:41 PM      Result Value Range   Color, Urine YELLOW  YELLOW   APPearance CLEAR  CLEAR   Specific Gravity, Urine 1.020  1.005 - 1.030   pH 5.0  5.0 - 8.0   Glucose, UA NEGATIVE  NEGATIVE mg/dL   Hgb urine dipstick NEGATIVE  NEGATIVE   Bilirubin Urine NEGATIVE  NEGATIVE   Ketones, ur NEGATIVE  NEGATIVE mg/dL   Protein, ur NEGATIVE  NEGATIVE mg/dL   Urobilinogen, UA 0.2  0.0 - 1.0 mg/dL   Nitrite NEGATIVE  NEGATIVE   Leukocytes, UA NEGATIVE  NEGATIVE   Comment: MICROSCOPIC NOT DONE ON URINES WITH NEGATIVE PROTEIN, BLOOD, LEUKOCYTES, NITRITE, OR GLUCOSE <1000 mg/dL.  URINE RAPID DRUG SCREEN (HOSP PERFORMED)     Status: None   Collection Time    06/09/12  4:41 PM      Result Value Range   Opiates NONE DETECTED  NONE DETECTED   Cocaine NONE DETECTED  NONE DETECTED   Benzodiazepines NONE DETECTED  NONE DETECTED   Amphetamines NONE DETECTED  NONE DETECTED   Tetrahydrocannabinol NONE DETECTED  NONE DETECTED   Barbiturates NONE DETECTED  NONE DETECTED   Comment:            DRUG SCREEN FOR MEDICAL PURPOSES     ONLY.  IF CONFIRMATION IS NEEDED     FOR ANY PURPOSE, NOTIFY LAB     WITHIN 5 DAYS.                LOWEST DETECTABLE LIMITS     FOR URINE DRUG SCREEN     Drug Class       Cutoff (ng/mL)     Amphetamine      1000     Barbiturate      200     Benzodiazepine   200     Tricyclics        300     Opiates          300     Cocaine          300     THC              50  TROPONIN I     Status: None   Collection Time    06/09/12  7:13 PM      Result Value Range   Troponin I <0.30  <0.30 ng/mL   Comment:            Due to the release kinetics of cTnI,     a negative result within the first hours     of the onset of symptoms does not rule out     myocardial infarction with certainty.     If myocardial infarction is still suspected,     repeat the test at appropriate intervals.   Ct Angio Chest Pe W/cm &/or Wo Cm  06/09/2012  *RADIOLOGY REPORT*  Clinical Data: Chest pain  CT ANGIOGRAPHY CHEST  Technique:  Multidetector CT imaging of the chest using the standard protocol during bolus administration of intravenous contrast. Multiplanar reconstructed images including MIPs were obtained and reviewed to evaluate the vascular anatomy.  Contrast: OMNIPAQUE IOHEXOL 350 MG/ML SOLN  Comparison: None.  Findings: There are no filling defects in the pulmonary arterial tree to suggest acute pulmonary thromboembolism.  Opacification of the aorta is limited.  No obvious aortic dissection or intramural hematoma.  No evidence of aortic aneurysm.  Mild atherosclerotic calcifications.  This involves the aortic arch and the left anterior descending coronary artery.  No evidence of abnormal adenopathy.  No pericardial effusion.  No pleural effusion.  No pneumothorax.  3 mm subpleural nodule in the lingula on image 51.  No acute bony deformity. Left paracentral posterior T7-T8 disc osteophytes do encroach upon the central canal.  IMPRESSION: No evidence of acute pulmonary thromboembolism.   Original Report Authenticated By: Jolaine Click, M.D.    Dg Chest Port 1 View  06/09/2012  *RADIOLOGY REPORT*  Clinical Data: Chest pain.  PORTABLE CHEST - 1 VIEW  Comparison: None.  Findings: The heart size is normal.  The lung volumes are low. Mild bibasilar atelectasis is evident.  No significant airspace  consolidation is present.  The visualized soft tissues and bony thorax are unremarkable.  IMPRESSION:  1.  Low lung volumes and mild bibasilar atelectasis. 2.  No other acute cardiopulmonary disease.   Original Report Authenticated By: Marin Roberts, M.D.    Cardiac Cath 2010: FINDINGS:  1. Left main: Normal.  2. LAD: Normal.  3. LCX: Codominant large vessel, normal appearing  4. High OM: Large vessel, normal appearing  5. RCA: Small to moderate size vessel, codominant, normal appearing.  6. LV: EF of 60%. No wall motion abnormalities. LVEDP is 25 mmHg.  ECG:  Sinus rhythm LAD, no acute ischemic changes. Telemetry: short runs of NSVT  Impression: Nonsustained VT Chest pain, atypical  Recommendations: 1. R/o MI with serial enzymes, ekg prn chest pain/arrythmia 2. Agree with TTE in am reassess LV function, wall motion abnormalities 3. Restart Coreg 3.125mg  BID, hold norvasc. 4. Given DM and HTN, consider adding ACEi in future if needed for more BP control 5. Pending clinical course and initial studies, will make plan for further evaluation and possible EP invovement.

## 2012-06-09 NOTE — H&P (Signed)
Hospital Admission Note Date: 06/09/2012  Patient name: Karl Peters Medical record number: [id number] Date of birth: [birth year] Age: 72 y.o. Gender: male PCP:  Dr. Cyndia Bent  Service:  Internal Medicine Teaching Service  Attending Physician:  Dr. Kem Kays   First Contact:  Dr. Earlene Plater   Pager:  [phone] Second Contact:  Dr. Manson Passey  Pager: [phone]     After 5PM, weekends, and holidays: First Contact:              Pager: [phone] Second Contact:         Pager: [phone]    Chief Complaint:  Nausea, chest pain, palpitations    History of Present Illness:  This is a 72 year old man with HTN, HLD, DM, and a history of an arrhythmia presenting to the ED with nausea, chest pain, and palpitations.  Last night around 18:00 he was eating dinner and began feeling nauseated.  This continued until he laid down around 20:30 for bed.  He then began experiencing palpitations and a chest heaviness.  The palpitations is described as racing heart alternating with skipped beats.  The palpitations resolved just before arriving to his PCP's office this morning. The chest pain was a substernal chest heaviness, nonexertional in nature, 2/10 in severity, no radiation, and persistent through the night last night until this morning when he presented to his PCP.  The nausea resolved just before arriving at his PCP's office.  On review of systems, he reports years of an intermittent gastrointestinal complaint of tenesmus and abdominal pain.  He has has this worked up in the past with normal colonoscopy.    Review of Systems:   Constitutional: Negative for fever, chills and diaphoresis.  HENT: Negative for congestion.   Eyes: Negative for blurred vision and double vision.  Respiratory: Negative for cough, sputum production and shortness of breath.   Cardiovascular: Positive for chest pain and palpitations.  Gastrointestinal: Positive for nausea. Negative for heartburn, vomiting, diarrhea, constipation, blood in stool  and melena.  Genitourinary: Negative for dysuria, frequency and hematuria.  Neurological: Positive for weakness. Negative for dizziness, tingling and headaches.     Medical History: Past Medical History  Diagnosis Date  . Ventricular tachycardia   . Normal coronary arteries   . Dyslipidemia   . Diabetes mellitus   . Obesity   . Hypertension     Surgical History: No past surgical history on file.   Home Medications: 1. Amlodipine 5 mg daily 2. Aspirin 325 mg daily 3. Bupropion 300 mg daily 4. Furosemide 20 mg daily 5. Metformin 1000 mg daily 6. Rosuvastatin 20 mg daily Patient denies taking a PPI or an H2 blocker regularly    Allergies: Allergies as of 06/09/2012  . (No Known Allergies)    Family History: Family History  Problem Relation Age of Onset  . Heart attack Father     Died at age 47 from heart attack  . Heart failure Mother     Died from heart failure    Social History: History   Social History  . Marital Status: Single    Spouse Name: N/A    Number of Children: N/A  . Years of Education: N/A   Occupational History  . Not on file.   Social History Main Topics  . Smoking status: Never Smoker   . Smokeless tobacco: Not on file  . Alcohol Use: No  . Drug Use: Not on file  . Sexually Active: Not on file   Other Topics Concern  .  Not on file   Social History Narrative  . No narrative on file     Physical exam: Filed Vitals:   06/09/12 1230 06/09/12 1330 06/09/12 1423 06/09/12 1500  BP: 120/63 112/58 127/68   Pulse: 89 78 83 83  Temp:   98.9 F (37.2 C)   TempSrc:   Oral   Resp: 14 15 20 18   SpO2: 97% 94% 98% 99%   VITALS:  BP 127/68, HR 83, RR 20, SpO2 98% on room air, temperature 98.7F GENERAL: overweight; no acute distress HEAD: atraumatic, normocephalic EYES: pupils equal, round and reactive; sclera anicteric; normal conjunctiva EARS: canals patent and TMs normal bilaterally NOSE/THROAT: oropharynx clear, moist mucous  membranes, pink gums, normal dentition NECK: supple, no carotid bruits, thyroid normal in size and without palpable nodules LYMPH: no cervical or supraclavicular lymphadenopathy LUNGS: clear to auscultation bilaterally, normal work of breathing HEART: normal rate and regular rhythm; normal S1 and S2 without S3 or S4; no murmurs, rubs, or clicks PULSES: radial 2+ and symmetric ABDOMEN: soft, non-tender, normal bowel sounds, no masses or organomegaly SKIN: warm, dry, intact, normal turgor, no rashes EXTREMITIES: no peripheral edema, clubbing, or cyanosis PSYCH: patient is alert and oriented, mood and affect are normal and congruent, thought content is normal without delusions, thought process is linear, speech is normal and non-pressured, behavior is normal, judgement and insight are normal     Lab results: Basic Metabolic Panel:  24/40/10 1226  NA 139  K 3.9  CL 104  GLUCOSE 143*  BUN 16  CREATININE 1.10    CBC:  06/09/12 1205 06/09/12 1226  WBC 8.3  --   NEUTROABS 6.9  --   HGB 12.8* 14.6  HCT 39.9 43.0  MCV 72.5*  --   PLT 273  --     Cardiac Enzymes:  06/09/12 1205  TROPONINI <0.30    Coagulation:  06/09/12 1205  LABPROT 13.6  INR 1.05  PTT  32     Imaging results: Ct Angio Chest Pe W/cm &/or Wo Cm 06/09/2012   FINDINGS: There are no filling defects in the pulmonary arterial tree to suggest acute pulmonary thromboembolism.  Opacification of the aorta is limited.  No obvious aortic dissection or intramural hematoma.  No evidence of aortic aneurysm.  Mild atherosclerotic calcifications.  This involves the aortic arch and the left anterior descending coronary artery.  No evidence of abnormal adenopathy.  No pericardial effusion.  No pleural effusion.  No pneumothorax.  3 mm subpleural nodule in the lingula on image 51.  No acute bony deformity. Left paracentral posterior T7-T8 disc osteophytes do encroach upon the central canal.   IMPRESSION: No evidence of acute  pulmonary thromboembolism.  Dg Chest Port 1 View 06/09/2012  FINDINGS: The heart size is normal.  The lung volumes are low. Mild bibasilar atelectasis is evident.  No significant airspace consolidation is present.  The visualized soft tissues and bony thorax are unremarkable.   IMPRESSION:  1.  Low lung volumes and mild bibasilar atelectasis. 2.  No other acute cardiopulmonary disease.     Other results:  EKG Results:  06/09/2012  11:23 Rate:  91 PR:  164 QRS:  100 QTc:  463 Axis:  LAD EKG: normal sinus rhythm, left axis deviation, left anterior fascicular block automated read.  EKG Results:  06/09/2012  13:25 Rate:  82 PR:  176 QRS:  96 QTc:  462 Axis:  LAD EKG: normal sinus rhythm, left axis deviation, left anterior fascicular block automated read.  Assessment and Plan:  1.   Chest pain: Most likely gastrointestinal clinical history, time course, and negative troponins so far. Arrhythmia is another possibility, such as a supraventricular tachyarrhythmia, may also be to blame but he has been normal sinus rhythm on telemetry since arriving at the ED. The episode of hypotension in the ED is concerning, but the patient remained in normal sinus rhythm throughout this episode. CTA negative for PE. - Serial troponins - Morning EKG  - Telemetry - 2-D echocardiogram - TSH  2.   Hypotension:  Patient had one episode of hypotension with blood pressures into the 70s over 50s. This was responsive to fluid bolus and he quickly recovered. He remained on telemetry throughout this episode and was normal sinus rhythm. A repeat EKG was also obtained that demonstrated no change from previous tracings and normal sinus rhythm. Normal telemetry rules out arrhythmia. Orthostatic hypotension is unlikely given the scenario. Autonomic insufficiency is possible. We will continue to evaluate for an rule out ischemia as above. We will obtain an echocardiogram to rule out outflow obstruction. - 2-D  echocardiogram  - Telemetry - Cardiology consultation  - Hold amlodipine  3.   Diabetes mellitus, type 2:  At home, he was taking metformin 1000 mg daily. We have held this and switched him to sensitive scale correctional insulin aspart.   4.   Hypertension, essential:  At home, he was taking amlodipine 5 mg daily and furosemide 20 mg daily. We have stopped the amlodipine but we will continue his furosemide 20 mg. He appears clinically euvolemic.  - Continue furosemide 20 mg daily  5.   Cardiovascular disease risk:  We do not have a lipid panel to stratify his individuals risk. At home, he was taking aspirin 325 mg daily and rosuvastatin 20 mg daily. No evidence of coronary artery disease. Not on a beta blocker or lisinopril.  - Continue aspirin 325 mg daily - While hospitalized, replaced rosuvastatin with atorvastatin 40 mg daily   6.   Chronic diastolic dysfunction:  Echocardiogram from 02/29/2008 demonstrates normal systolic function but impaired relaxation. We are repeating an echocardiogram. We are continuing his daily furosemide.   7.   Prophylaxis:  Enoxaparin  8.   Disposition:  She will followup with PCP Dr. Cyndia Bent. Expected length of stay > 2 nights.     Signed by:  Dorthula Rue. Earlene Plater, MD PGY-I, Internal Medicine  06/09/2012, 7:01 PM

## 2012-06-09 NOTE — Progress Notes (Signed)
Cardiologist made aware of pt's hr jumping from 90s, low 100s, to 130s. Pt is asymptomatic & VS are stable. Will continue to monitor the pt. Sanda Linger

## 2012-06-09 NOTE — ED Notes (Signed)
Pt reports to the ED from his PCPs office for eval of substernal non-radiating CP. Pt reports it started last night and continued into this morning. After 324 of ASA and 1 nitro his CP has resolved. Pt denies any active CP at this time. Associated symptoms include nausea. Denies any SOB, dizziness, or diaphoresis. Pt denies any aggravating or relieving factors. Pt also reports generalized weakness. Pt is A&O x4.

## 2012-06-09 NOTE — Progress Notes (Signed)
Pt called me into the room saying he was feeling palpitations. Pt was holding his wrist. He stated there it is again there were two sets of two ventricular beats in a row then NSR. When looking back at the monitor pt did have little runs 3-5 beats of v-tach when he brought this sensation to my attention. Dr. Earlene Plater paged and notified. Will continue to monitor.

## 2012-06-09 NOTE — Progress Notes (Signed)
Called [facility] to discuss patient. They will come see.  In speaking with the patient he wasn't taking Coreg 12.5 bid and been taken off by Elizabeth Palau NP or PAC at his PCP's office. He was taking Norvasc 5 mg , Asa 325 mg, Crestor 20 mg.    Shirlee Latch MD [phone]

## 2012-06-10 DIAGNOSIS — I959 Hypotension, unspecified: Secondary | ICD-10-CM

## 2012-06-10 DIAGNOSIS — I4949 Other premature depolarization: Secondary | ICD-10-CM

## 2012-06-10 DIAGNOSIS — I517 Cardiomegaly: Secondary | ICD-10-CM

## 2012-06-10 DIAGNOSIS — R0789 Other chest pain: Secondary | ICD-10-CM

## 2012-06-10 LAB — TROPONIN I: Troponin I: <0.3 ng/mL (ref <0.30)

## 2012-06-10 LAB — COMPREHENSIVE METABOLIC PANEL
ALT: 10 U/L (ref 0–53)
AST: 15 U/L (ref 0–37)
Albumin: 3.1 g/dL — ABNORMAL LOW (ref 3.5–5.2)
Alkaline Phosphatase: 99 U/L (ref 39–117)
BUN: 16 mg/dL (ref 6–23)
CO2: 25 mEq/L (ref 19–32)
Calcium: 8.8 mg/dL (ref 8.4–10.5)
Chloride: 102 mEq/L (ref 96–112)
Creatinine, Ser: 1.04 mg/dL (ref 0.50–1.35)
GFR calc Af Amer: 82 mL/min — ABNORMAL LOW (ref >90)
GFR calc non Af Amer: 71 mL/min — ABNORMAL LOW (ref >90)
Glucose, Bld: 123 mg/dL — ABNORMAL HIGH (ref 70–99)
Potassium: 4.1 mEq/L (ref 3.5–5.1)
Sodium: 137 mEq/L (ref 135–145)
Total Bilirubin: 0.3 mg/dL (ref 0.3–1.2)
Total Protein: 6.9 g/dL (ref 6.0–8.3)

## 2012-06-10 LAB — GLUCOSE, CAPILLARY
Glucose-Capillary: 142 mg/dL — ABNORMAL HIGH (ref 70–99)
Glucose-Capillary: 112 mg/dL — ABNORMAL HIGH (ref 70–99)
Glucose-Capillary: 104 mg/dL — ABNORMAL HIGH (ref 70–99)
Glucose-Capillary: 122 mg/dL — ABNORMAL HIGH (ref 70–99)
Glucose-Capillary: 122 mg/dL — ABNORMAL HIGH (ref 70–99)

## 2012-06-10 LAB — TSH: TSH: 1.07 u[IU]/mL (ref 0.350–4.500)

## 2012-06-10 LAB — CLOSTRIDIUM DIFFICILE BY PCR: Toxigenic C. Difficile by PCR: NEGATIVE

## 2012-06-10 MED ORDER — LOPERAMIDE HCL 2 MG PO CAPS
2.0000 mg | ORAL_CAPSULE | Freq: Four times a day (QID) | ORAL | Status: DC | PRN
  Administered 2012-06-10 – 2012-06-11 (×2): 2 mg via ORAL
  Filled 2012-06-10 (×2): qty 1

## 2012-06-10 MED ORDER — LOPERAMIDE HCL 2 MG PO CAPS: 4.0000 mg | ORAL_CAPSULE | Freq: Once | ORAL | Status: DC

## 2012-06-10 MED ORDER — PERFLUTREN LIPID MICROSPHERE
1.0000 mL | INTRAVENOUS | Status: AC | PRN
  Administered 2012-06-10: 1.3 mL via INTRAVENOUS
  Filled 2012-06-10: qty 10

## 2012-06-10 MED ORDER — CARVEDILOL 3.125 MG PO TABS
3.1250 mg | ORAL_TABLET | Freq: Two times a day (BID) | ORAL | Status: DC
  Administered 2012-06-10 – 2012-06-11 (×4): 3.125 mg via ORAL
  Filled 2012-06-10 (×6): qty 1

## 2012-06-10 NOTE — Progress Notes (Signed)
PT Discharge Note  Patient Details Name: Karl Peters MRN: [id number] DOB: February 07, 1941   Cancelled Treatment:    Reason Eval Not Completed: Pt reports he has no issues with his balance, mobility, or strength. Wife present and agrees he's been getting up and going to the bathroom on his own and has no deficits.   SASSER,LYNN 06/10/2012, 3:26 PM  06/10/2012 Veda Canning, PT Pager: [phone]

## 2012-06-10 NOTE — Progress Notes (Signed)
Cardiologist aware of pt having 3-5 beats of v-tach. Pt asymptomatic. Awaiting orders. Will continue to monitor the pt. Sanda Linger

## 2012-06-10 NOTE — Progress Notes (Signed)
  Echocardiogram 2D Echocardiogram has been performed.  Georgian Co 06/10/2012, 12:19 PM

## 2012-06-10 NOTE — H&P (Signed)
INTERNAL MEDICINE TEACHING SERVICE Attending Admission Note  Date: 06/10/2012  Patient name: Karl Peters  Medical record number: [id number]  Date of birth: Jul 31, 1940    I have seen and evaluated Jeannetta Ellis and discussed their care with the Residency Team.  71 yr. Old WM with history of "arrythmia", Type 2 DM, HTN, no reported CAD, HL, obesity Body mass index is 42.33 kg/(m^2)., presented with CP and palpitations. The patient states this has happened before, specifically when he would get urinary urgency and lower abdominal pain and have diaphoresis and syncope and palpiations.  He has had a TTE performed with showed a normal EF, but some LVH.  He has been evaluated by Holter and found to have episodes of Vtach.  He underwent LHC with normal coronary arteries and was started on Coreg.   He states yesterday he felt like he had indigestion, but describes a mid-chest, substernal pressure associated with nausea.  He presented to his PCP and was sent to the ED.   He was noted to have an episode of hypotension that responded to an IVF bolus.  There were no changes in rhythm on telemetry.   He states overnight he has felt and slept well. Denies further CP or SOB.    Physical Exam: Blood pressure 127/74, pulse 83, temperature 99.6 F (37.6 C), temperature source Oral, resp. rate 20, height 5\' 8"  (1.727 m), weight 278 lb 4.8 oz (126.236 kg), SpO2 96.00%.  General: Vital signs reviewed and noted. Well-developed, well-nourished, in no acute distress; alert, appropriate and cooperative throughout examination.  Head: Normocephalic, atraumatic.  Eyes: PERRL, EOMI, No signs of anemia or jaundince.  Nose: Mucous membranes moist, not inflammed, nonerythematous.  Throat: Oropharynx nonerythematous, no exudate appreciated.   Neck: No deformities, masses, or tenderness noted.Supple, No carotid Bruits, no JVD.  Lungs:  Normal respiratory effort. Clear to auscultation BL without crackles or wheezes.  Heart:  RRR. S1 and S2 normal without gallop, murmur, or rubs.  Abdomen:  BS normoactive. Soft, Nondistended, non-tender.  No masses or organomegaly.  Extremities: No pretibial edema.  Neurologic: A&O X3, CN II - XII are grossly intact.  Skin: No visible rashes, scars.    Lab results: Results for orders placed during the hospital encounter of 06/09/12 (from the past 24 hour(s))  CBC WITH DIFFERENTIAL     Status: Abnormal   Collection Time    06/09/12 12:05 PM      Result Value Range   WBC 8.3  4.0 - 10.5 K/uL   RBC 5.50  4.22 - 5.81 MIL/uL   Hemoglobin 12.8 (*) 13.0 - 17.0 g/dL   HCT 40.9  81.1 - 91.4 %   MCV 72.5 (*) 78.0 - 100.0 fL   MCH 23.3 (*) 26.0 - 34.0 pg   MCHC 32.1  30.0 - 36.0 g/dL   RDW 78.2 (*) 95.6 - 21.3 %   Platelets 273  150 - 400 K/uL   Neutrophils Relative 84 (*) 43 - 77 %   Neutro Abs 6.9  1.7 - 7.7 K/uL   Lymphocytes Relative 10 (*) 12 - 46 %   Lymphs Abs 0.8  0.7 - 4.0 K/uL   Monocytes Relative 6  3 - 12 %   Monocytes Absolute 0.5  0.1 - 1.0 K/uL   Eosinophils Relative 0  0 - 5 %   Eosinophils Absolute 0.0  0.0 - 0.7 K/uL   Basophils Relative 0  0 - 1 %   Basophils Absolute 0.0  0.0 -  0.1 K/uL  APTT     Status: None   Collection Time    06/09/12 12:05 PM      Result Value Range   aPTT 32  24 - 37 seconds  PROTIME-INR     Status: None   Collection Time    06/09/12 12:05 PM      Result Value Range   Prothrombin Time 13.6  11.6 - 15.2 seconds   INR 1.05  0.00 - 1.49  TROPONIN I     Status: None   Collection Time    06/09/12 12:05 PM      Result Value Range   Troponin I <0.30  <0.30 ng/mL  POCT I-STAT, CHEM 8     Status: Abnormal   Collection Time    06/09/12 12:26 PM      Result Value Range   Sodium 139  135 - 145 mEq/L   Potassium 3.9  3.5 - 5.1 mEq/L   Chloride 104  96 - 112 mEq/L   BUN 16  6 - 23 mg/dL   Creatinine, Ser 4.09  0.50 - 1.35 mg/dL   Glucose, Bld 811 (*) 70 - 99 mg/dL   Calcium, Ion 9.14 (*) 1.13 - 1.30 mmol/L   TCO2 23  0 - 100  mmol/L   Hemoglobin 14.6  13.0 - 17.0 g/dL   HCT 78.2  95.6 - 21.3 %  URINALYSIS, ROUTINE W REFLEX MICROSCOPIC     Status: None   Collection Time    06/09/12  4:41 PM      Result Value Range   Color, Urine YELLOW  YELLOW   APPearance CLEAR  CLEAR   Specific Gravity, Urine 1.020  1.005 - 1.030   pH 5.0  5.0 - 8.0   Glucose, UA NEGATIVE  NEGATIVE mg/dL   Hgb urine dipstick NEGATIVE  NEGATIVE   Bilirubin Urine NEGATIVE  NEGATIVE   Ketones, ur NEGATIVE  NEGATIVE mg/dL   Protein, ur NEGATIVE  NEGATIVE mg/dL   Urobilinogen, UA 0.2  0.0 - 1.0 mg/dL   Nitrite NEGATIVE  NEGATIVE   Leukocytes, UA NEGATIVE  NEGATIVE  URINE RAPID DRUG SCREEN (HOSP PERFORMED)     Status: None   Collection Time    06/09/12  4:41 PM      Result Value Range   Opiates NONE DETECTED  NONE DETECTED   Cocaine NONE DETECTED  NONE DETECTED   Benzodiazepines NONE DETECTED  NONE DETECTED   Amphetamines NONE DETECTED  NONE DETECTED   Tetrahydrocannabinol NONE DETECTED  NONE DETECTED   Barbiturates NONE DETECTED  NONE DETECTED  TROPONIN I     Status: None   Collection Time    06/09/12  7:13 PM      Result Value Range   Troponin I <0.30  <0.30 ng/mL  GLUCOSE, CAPILLARY     Status: Abnormal   Collection Time    06/09/12  9:06 PM      Result Value Range   Glucose-Capillary 127 (*) 70 - 99 mg/dL  TROPONIN I     Status: None   Collection Time    06/10/12 12:02 AM      Result Value Range   Troponin I <0.30  <0.30 ng/mL  TSH     Status: None   Collection Time    06/10/12  5:30 AM      Result Value Range   TSH 1.070  0.350 - 4.500 uIU/mL  COMPREHENSIVE METABOLIC PANEL     Status: Abnormal   Collection  Time    06/10/12  5:30 AM      Result Value Range   Sodium 137  135 - 145 mEq/L   Potassium 4.1  3.5 - 5.1 mEq/L   Chloride 102  96 - 112 mEq/L   CO2 25  19 - 32 mEq/L   Glucose, Bld 123 (*) 70 - 99 mg/dL   BUN 16  6 - 23 mg/dL   Creatinine, Ser 4.09  0.50 - 1.35 mg/dL   Calcium 8.8  8.4 - 81.1 mg/dL   Total  Protein 6.9  6.0 - 8.3 g/dL   Albumin 3.1 (*) 3.5 - 5.2 g/dL   AST 15  0 - 37 U/L   ALT 10  0 - 53 U/L   Alkaline Phosphatase 99  39 - 117 U/L   Total Bilirubin 0.3  0.3 - 1.2 mg/dL   GFR calc non Af Amer 71 (*) >90 mL/min   GFR calc Af Amer 82 (*) >90 mL/min  GLUCOSE, CAPILLARY     Status: Abnormal   Collection Time    06/10/12  7:48 AM      Result Value Range   Glucose-Capillary 142 (*) 70 - 99 mg/dL   Comment 1 Notify RN      Imaging results:  Ct Angio Chest Pe W/cm &/or Wo Cm  06/09/2012  *RADIOLOGY REPORT*  Clinical Data: Chest pain  CT ANGIOGRAPHY CHEST  Technique:  Multidetector CT imaging of the chest using the standard protocol during bolus administration of intravenous contrast. Multiplanar reconstructed images including MIPs were obtained and reviewed to evaluate the vascular anatomy.  Contrast: OMNIPAQUE IOHEXOL 350 MG/ML SOLN  Comparison: None.  Findings: There are no filling defects in the pulmonary arterial tree to suggest acute pulmonary thromboembolism.  Opacification of the aorta is limited.  No obvious aortic dissection or intramural hematoma.  No evidence of aortic aneurysm.  Mild atherosclerotic calcifications.  This involves the aortic arch and the left anterior descending coronary artery.  No evidence of abnormal adenopathy.  No pericardial effusion.  No pleural effusion.  No pneumothorax.  3 mm subpleural nodule in the lingula on image 51.  No acute bony deformity. Left paracentral posterior T7-T8 disc osteophytes do encroach upon the central canal.  IMPRESSION: No evidence of acute pulmonary thromboembolism.   Original Report Authenticated By: Jolaine Click, M.D.    Dg Chest Port 1 View  06/09/2012  *RADIOLOGY REPORT*  Clinical Data: Chest pain.  PORTABLE CHEST - 1 VIEW  Comparison: None.  Findings: The heart size is normal.  The lung volumes are low. Mild bibasilar atelectasis is evident.  No significant airspace consolidation is present.  The visualized soft  tissues and bony thorax are unremarkable.  IMPRESSION:  1.  Low lung volumes and mild bibasilar atelectasis. 2.  No other acute cardiopulmonary disease.   Original Report Authenticated By: Marin Roberts, M.D.      Assessment and Plan: I agree with the formulated Assessment and Plan with the following changes: 71 yr. Old WM with history of "arrythmia", Type 2 DM, HTN, no reported CAD, HL, obesity Body mass index is 42.33 kg/(m^2)., presented with CP and palpitations. -Nonsustained Vtach: CE's negative x 3.  Cardiology has been consulted. Restarted Coreg.  TTE. May need further evaluation by EP. Agree with stopping amlodipine. Continue to monitor on telemetry. -rest per resident note.    The treatment plan was discussed in detail with the patient.  Alternatives to treatment, side effects, risks and benefits, and complications  were discussed with the patient. Informed consent was obtained. The patient agrees to proceed with the current treatment plan.  Jonah Blue, DO 4/25/201411:27 AM

## 2012-06-10 NOTE — Progress Notes (Signed)
Subjective: No CP  No dizziness No SOB  Continues to have diarrhea. Objective: Filed Vitals:   06/10/12 1610 06/10/12 0622 06/10/12 0627 06/10/12 0824  BP: 134/84 123/82 108/65 127/74  Pulse: 89 94 100 83  Temp:      TempSrc:      Resp:      Height:      Weight:   278 lb 4.8 oz (126.236 kg)   SpO2: 94% 95% 96%    Weight change:   Intake/Output Summary (Last 24 hours) at 06/10/12 1231 Last data filed at 06/09/12 2254  Gross per 24 hour  Intake    366 ml  Output      0 ml  Net    366 ml    General: Alert, awake, oriented x3, in no acute distress Neck:  JVP is normal Heart: Regular rate and rhythm, without murmurs, rubs, gallops.  Lungs: Clear to auscultation.  No rales or wheezes. Exemities:  No edema.   Neuro: Grossly intact, nonfocal.  Tele:  SR with PVCs, couplets. Lab Results: Results for orders placed during the hospital encounter of 06/09/12 (from the past 24 hour(s))  URINALYSIS, ROUTINE W REFLEX MICROSCOPIC     Status: None   Collection Time    06/09/12  4:41 PM      Result Value Range   Color, Urine YELLOW  YELLOW   APPearance CLEAR  CLEAR   Specific Gravity, Urine 1.020  1.005 - 1.030   pH 5.0  5.0 - 8.0   Glucose, UA NEGATIVE  NEGATIVE mg/dL   Hgb urine dipstick NEGATIVE  NEGATIVE   Bilirubin Urine NEGATIVE  NEGATIVE   Ketones, ur NEGATIVE  NEGATIVE mg/dL   Protein, ur NEGATIVE  NEGATIVE mg/dL   Urobilinogen, UA 0.2  0.0 - 1.0 mg/dL   Nitrite NEGATIVE  NEGATIVE   Leukocytes, UA NEGATIVE  NEGATIVE  URINE RAPID DRUG SCREEN (HOSP PERFORMED)     Status: None   Collection Time    06/09/12  4:41 PM      Result Value Range   Opiates NONE DETECTED  NONE DETECTED   Cocaine NONE DETECTED  NONE DETECTED   Benzodiazepines NONE DETECTED  NONE DETECTED   Amphetamines NONE DETECTED  NONE DETECTED   Tetrahydrocannabinol NONE DETECTED  NONE DETECTED   Barbiturates NONE DETECTED  NONE DETECTED  TROPONIN I     Status: None   Collection Time    06/09/12  7:13 PM     Result Value Range   Troponin I <0.30  <0.30 ng/mL  GLUCOSE, CAPILLARY     Status: Abnormal   Collection Time    06/09/12  9:06 PM      Result Value Range   Glucose-Capillary 127 (*) 70 - 99 mg/dL  TROPONIN I     Status: None   Collection Time    06/10/12 12:02 AM      Result Value Range   Troponin I <0.30  <0.30 ng/mL  TSH     Status: None   Collection Time    06/10/12  5:30 AM      Result Value Range   TSH 1.070  0.350 - 4.500 uIU/mL  COMPREHENSIVE METABOLIC PANEL     Status: Abnormal   Collection Time    06/10/12  5:30 AM      Result Value Range   Sodium 137  135 - 145 mEq/L   Potassium 4.1  3.5 - 5.1 mEq/L   Chloride 102  96 - 112 mEq/L  CO2 25  19 - 32 mEq/L   Glucose, Bld 123 (*) 70 - 99 mg/dL   BUN 16  6 - 23 mg/dL   Creatinine, Ser 1.61  0.50 - 1.35 mg/dL   Calcium 8.8  8.4 - 09.6 mg/dL   Total Protein 6.9  6.0 - 8.3 g/dL   Albumin 3.1 (*) 3.5 - 5.2 g/dL   AST 15  0 - 37 U/L   ALT 10  0 - 53 U/L   Alkaline Phosphatase 99  39 - 117 U/L   Total Bilirubin 0.3  0.3 - 1.2 mg/dL   GFR calc non Af Amer 71 (*) >90 mL/min   GFR calc Af Amer 82 (*) >90 mL/min  GLUCOSE, CAPILLARY     Status: Abnormal   Collection Time    06/10/12  7:48 AM      Result Value Range   Glucose-Capillary 142 (*) 70 - 99 mg/dL   Comment 1 Notify RN    GLUCOSE, CAPILLARY     Status: Abnormal   Collection Time    06/10/12 11:28 AM      Result Value Range   Glucose-Capillary 112 (*) 70 - 99 mg/dL   Comment 1 Notify RN      Studies/Results: Echo:  - Left ventricle: The cavity size was normal. Wall thickness was normal. Systolic function was normal. The estimated ejection fraction was in the range of 50% to 55%. Regional wall motion abnormalities cannot be excluded. Doppler parameters are consistent with abnormal left ventricular relaxation (grade 1 diastolic dysfunction). - Left atrium: The atrium was mildly dilated. Impressions:  - Technically difficult; definity used; LV function  appears to be preserved; focal wall motion abnormality cannot be excluded; suggest MUGA or cardiac MRI if clinically indicated.    Medications:  Reviewed  Impression:   1.  CP  I am not convinced cardiac in origin  Prolonged with normal enzymes.   Wonder if GI given diarrhea I would not plan further cardiac work up for now  2.  PVCs  I would keep on b blocker.  3.  Diarrhea.  Per primary team.  LOS: 1 day   Dietrich Pates 06/10/2012, 12:31 PM

## 2012-06-10 NOTE — Progress Notes (Signed)
Subjective:    Interval Events:  No more chest discomfort or palpitations.  No dyspnea.  Has had diarrhea with green, watery stools.  Also has abdominal pain and nausea.     Objective:    Vital Signs:   Filed Vitals:   06/10/12 0620 06/10/12 0621 06/10/12 0622 06/10/12 0627  BP: 134/81 134/84 123/82 108/65  Pulse: 82 89 94 100  Temp: 99.6 F (37.6 C)     TempSrc: Oral     Resp:      Height:      Weight:    278 lb 4.8 oz (126.236 kg)  SpO2: 95% 94% 95% 96%    Weights: [employer]   06/10/12 0627  Weight: 278 lb 4.8 oz (126.236 kg)    Intake/Output:   Gross per 24 hour  Intake    366 ml  Output      0 ml  Net    366 ml     Last BM Date: 06/09/12   Physical Exam: GENERAL: alert and oriented; resting comfortably in bed and in no distress LUNGS: clear to auscultation bilaterally, normal work of breathing HEART: normal rate; regular rhythm; normal S1 and S2, no S3 or S4 appreciated; no murmurs, rubs, or clicks ABDOMEN: soft, generalized tenderness with palpation in all quadrants, no masses or organomegaly, hyperactive bowel sounds    Labs: Basic Metabolic Panel: Lab 06/09/12 1226 06/10/12 0530  NA 139 137  K 3.9 4.1  CL 104 102  CO2  --  25  GLUCOSE 143* 123*  BUN 16 16  CREATININE 1.10 1.04  CALCIUM  --  8.8    Liver Function Tests: Lab 06/10/12 0530  AST 15  ALT 10  ALKPHOS 99  BILITOT 0.3  PROT 6.9  ALBUMIN 3.1*    CBC Component Value Date/Time   WBC 8.3 06/09/2012 1205   RBC 5.50 06/09/2012 1205   HGB 14.6 06/09/2012 1226   HCT 43.0 06/09/2012 1226   PLT 273 06/09/2012 1205   MCV 72.5* 06/09/2012 1205   MCH 23.3* 06/09/2012 1205   MCHC 32.1 06/09/2012 1205   RDW 18.2* 06/09/2012 1205   LYMPHSABS 0.8 06/09/2012 1205   MONOABS 0.5 06/09/2012 1205   EOSABS 0.0 06/09/2012 1205   BASOSABS 0.0 06/09/2012 1205     Cardiac Enzymes: Lab 06/09/12 1205 06/09/12 1913 06/10/12 0002  TROPONINI <0.30 <0.30 <0.30    CBG: Lab 06/09/12 2106  06/10/12 0748  GLUCAP 127* 142*    Other results: EKG Results:  06/10/2012 Rate:  88 PR:  164 QRS:  92 QTc:  440 Axis: Left EKG: unchanged from previous tracings, normal sinus rhythm, PVC noted, left anterior fascicular block.    Medications:    Infusions:     Scheduled Medications: . aspirin  325 mg Oral Daily  . atorvastatin  40 mg Oral q1800  . buPROPion  300 mg Oral Daily  . carvedilol  3.125 mg Oral BID WC  . enoxaparin (LOVENOX) injection  40 mg Subcutaneous Q24H  . furosemide  20 mg Oral Daily  . insulin aspart  0-9 Units Subcutaneous TID WC  . pantoprazole  40 mg Oral Daily  . sodium chloride  3 mL Intravenous Q12H     PRN Medications: acetaminophen, acetaminophen, ondansetron (ZOFRAN) IV, ondansetron    Assessment/ Plan:    1.   Chest pain & ventricular tachyarrhythmia:  Most likely secondary to ventricular tachyarrhythmia since the symptoms reoccurred last night with documented runs of ventricular tachycardia on the  monitor.  Coronary ischemia and pulmonary embolism have been effectively ruled out.  Cardiology has recommended restarting carvedilol and stopping amlodipine, which we have done.  Cardiology will continue to follow - Telemetry  - 2-D echocardiogram  - TSH pending - Cardiology to follow  2.   Hypotension:  Patient had one episode of hypotension with blood pressures into the 70s over 50s. This was responsive to fluid bolus and he quickly recovered. He remained on telemetry throughout this episode and was normal sinus rhythm. A repeat EKG was also obtained that demonstrated no change from previous tracings and normal sinus rhythm. Normal telemetry rules out arrhythmia. Orthostatic hypotension is unlikely given the scenario. Autonomic insufficiency is possible. We will obtain an echocardiogram to rule out outflow obstruction.  - 2-D echocardiogram  - Telemetry  - Cardiology consultation   3.   Diarrhea and abdominal pain:  Most likely a bout of  gastroenteritis.  Last abx dose was 3 weeks ago; he took a 10 day course of oral abx for bronchitis. - C diff PCR  4.   Diabetes mellitus, type 2:  At home, he was taking metformin 1000 mg daily. We have held this and switched him to sensitive scale correctional insulin aspart.  5.   Hypertension, essential:  At home, he was taking amlodipine 5 mg daily and furosemide 20 mg daily. We have stopped the amlodipine but we will continue his furosemide 20 mg. He appears clinically euvolemic.  Carvedilol has been added.  Cardiology suggests an ACEi should additional blood pressure control be required. - Continue furosemide 20 mg daily  - Stop amlodipine - Start carvedilol 3.125 mg BID  6.   Cardiovascular disease risk:  We do not have a lipid panel to stratify his individuals risk. At home, he was taking aspirin 325 mg daily and rosuvastatin 20 mg daily. No evidence of coronary artery disease. - Continue aspirin 325 mg daily  - While hospitalized, replaced rosuvastatin with atorvastatin 40 mg daily   7  . Chronic diastolic dysfunction:  Echocardiogram from 02/29/2008 demonstrates normal systolic function but impaired relaxation. We are repeating an echocardiogram. We are continuing his daily furosemide.   8.   Prophylaxis:  Enoxaparin   9.   Disposition:  She will followup with PCP Dr. Cyndia Bent. Expected length of stay > 2 nights.    Length of Stay: 1 days   Signed by:  Dorthula Rue. Earlene Plater, MD PGY-I, Internal Medicine Pager [phone]  06/10/2012, 8:15 AM

## 2012-06-11 DIAGNOSIS — R079 Chest pain, unspecified: Secondary | ICD-10-CM

## 2012-06-11 DIAGNOSIS — I4729 Other ventricular tachycardia: Secondary | ICD-10-CM

## 2012-06-11 DIAGNOSIS — I472 Ventricular tachycardia: Secondary | ICD-10-CM

## 2012-06-11 LAB — GLUCOSE, CAPILLARY
Glucose-Capillary: 132 mg/dL — ABNORMAL HIGH (ref 70–99)
Glucose-Capillary: 112 mg/dL — ABNORMAL HIGH (ref 70–99)
Glucose-Capillary: 103 mg/dL — ABNORMAL HIGH (ref 70–99)

## 2012-06-11 LAB — BASIC METABOLIC PANEL
BUN: 20 mg/dL (ref 6–23)
CO2: 23 mEq/L (ref 19–32)
Calcium: 9.1 mg/dL (ref 8.4–10.5)
Chloride: 102 mEq/L (ref 96–112)
Creatinine, Ser: 1.16 mg/dL (ref 0.50–1.35)
GFR calc Af Amer: 72 mL/min — ABNORMAL LOW (ref >90)
GFR calc non Af Amer: 62 mL/min — ABNORMAL LOW (ref >90)
Glucose, Bld: 130 mg/dL — ABNORMAL HIGH (ref 70–99)
Potassium: 4 mEq/L (ref 3.5–5.1)
Sodium: 135 mEq/L (ref 135–145)

## 2012-06-11 LAB — CBC WITH DIFFERENTIAL/PLATELET
Basophils Absolute: 0 10*3/uL (ref 0.0–0.1)
Basophils Relative: 0 % (ref 0–1)
Eosinophils Absolute: 0 10*3/uL (ref 0.0–0.7)
Eosinophils Relative: 1 % (ref 0–5)
HCT: 38.2 % — ABNORMAL LOW (ref 39.0–52.0)
Hemoglobin: 12.4 g/dL — ABNORMAL LOW (ref 13.0–17.0)
Lymphocytes Relative: 18 % (ref 12–46)
Lymphs Abs: 1.1 10*3/uL (ref 0.7–4.0)
MCH: 23.6 pg — ABNORMAL LOW (ref 26.0–34.0)
MCHC: 32.5 g/dL (ref 30.0–36.0)
MCV: 72.6 fL — ABNORMAL LOW (ref 78.0–100.0)
Monocytes Absolute: 0.9 10*3/uL (ref 0.1–1.0)
Monocytes Relative: 14 % — ABNORMAL HIGH (ref 3–12)
Neutro Abs: 4.4 10*3/uL (ref 1.7–7.7)
Neutrophils Relative %: 68 % (ref 43–77)
Platelets: 242 10*3/uL (ref 150–400)
RBC: 5.26 MIL/uL (ref 4.22–5.81)
RDW: 18.3 % — ABNORMAL HIGH (ref 11.5–15.5)
WBC: 6.5 10*3/uL (ref 4.0–10.5)

## 2012-06-11 MED ORDER — SODIUM CHLORIDE 0.9 % IV SOLN
INTRAVENOUS | Status: DC
  Administered 2012-06-11: 200 mL via INTRAVENOUS

## 2012-06-11 MED ORDER — CARVEDILOL 3.125 MG PO TABS
3.1250 mg | ORAL_TABLET | Freq: Once | ORAL | Status: AC
  Administered 2012-06-11: 3.125 mg via ORAL
  Filled 2012-06-11: qty 1

## 2012-06-11 MED ORDER — ONDANSETRON HCL 4 MG PO TABS: 4.0000 mg | ORAL_TABLET | Freq: Three times a day (TID) | ORAL | Status: DC | PRN

## 2012-06-11 MED ORDER — LOPERAMIDE HCL 2 MG PO CAPS: 2.0000 mg | ORAL_CAPSULE | Freq: Four times a day (QID) | ORAL | Status: DC | PRN

## 2012-06-11 MED ORDER — CARVEDILOL 6.25 MG PO TABS: 6.2500 mg | ORAL_TABLET | Freq: Two times a day (BID) | ORAL | Status: DC

## 2012-06-11 MED ORDER — CARVEDILOL 6.25 MG PO TABS
6.2500 mg | ORAL_TABLET | Freq: Two times a day (BID) | ORAL | Status: DC
  Administered 2012-06-11: 6.25 mg via ORAL
  Filled 2012-06-11 (×2): qty 1

## 2012-06-11 NOTE — Progress Notes (Signed)
OT Cancellation Note  Patient Details Name: Karl Peters MRN: [id number] DOB: 1940-12-25   Cancelled Treatment:    Reason Eval/Treat Not Completed:  Pt is independent in mobility and ADL. No OT needs.  Evern Bio 06/11/2012, 2:37 PM

## 2012-06-11 NOTE — Progress Notes (Signed)
Subjective:    Interval Events:  The patient notes >10 episodes of watery bowel movements last night, though C diff was negative, and patient has only had 1 BM this morning.  The patient notes poor PO intake yesterday, but feels more like eating today.  Tele review notes continued occasional runs of NSVT (9 runs at 2:20 am), for which we have restarted his BB, and cards plans no further intervention per yesterday's note.    Objective:    Vital Signs:   Filed Vitals:   06/10/12 2100 06/11/12 0638 06/11/12 0801 06/11/12 1027  BP: 113/62 105/64 123/81 128/77  Pulse: 83 79 76 70  Temp: 98.8 F (37.1 C) 98.3 F (36.8 C)    TempSrc: Oral Oral    Resp: 20 20    Height:      Weight:      SpO2: 96% 94%      Weights: [employer]   06/10/12 0627  Weight: 278 lb 4.8 oz (126.236 kg)    Intake/Output:   Gross per 24 hour  Intake    366 ml  Output      0 ml  Net    366 ml     Last BM Date: 06/11/12   Physical Exam: General: alert, cooperative, and in no apparent distress HEENT: pupils equal round and reactive to light, vision grossly intact, oropharynx clear and non-erythematous  Neck: supple, no lymphadenopathy Lungs: clear to ascultation bilaterally, normal work of respiration, no wheezes, rales, ronchi Heart: regular rate and rhythm, no murmurs, gallops, or rubs Abdomen: soft, only moderate discomfort to epigastric palpation Extremities: no cyanosis, clubbing, or edema Neurologic: alert & oriented X3, cranial nerves II-XII intact, strength grossly intact, sensation intact to light touch   Labs: Basic Metabolic Panel: Lab 06/09/12 1226 06/10/12 0530  NA 139 137  K 3.9 4.1  CL 104 102  CO2  --  25  GLUCOSE 143* 123*  BUN 16 16  CREATININE 1.10 1.04  CALCIUM  --  8.8    Liver Function Tests: Lab 06/10/12 0530  AST 15  ALT 10  ALKPHOS 99  BILITOT 0.3  PROT 6.9  ALBUMIN 3.1*    CBC Component Value Date/Time   WBC 8.3 06/09/2012 1205   RBC 5.50  06/09/2012 1205   HGB 14.6 06/09/2012 1226   HCT 43.0 06/09/2012 1226   PLT 273 06/09/2012 1205   MCV 72.5* 06/09/2012 1205   MCH 23.3* 06/09/2012 1205   MCHC 32.1 06/09/2012 1205   RDW 18.2* 06/09/2012 1205   LYMPHSABS 0.8 06/09/2012 1205   MONOABS 0.5 06/09/2012 1205   EOSABS 0.0 06/09/2012 1205   BASOSABS 0.0 06/09/2012 1205     Cardiac Enzymes: Lab 06/09/12 1205 06/09/12 1913 06/10/12 0002  TROPONINI <0.30 <0.30 <0.30    CBG: Lab 06/09/12 2106 06/10/12 0748  GLUCAP 127* 142*    Other results: EKG Results:  06/11/2012 Rate:  88 PR:  164 QRS:  92 QTc:  440 Axis: Left EKG: unchanged from previous tracings, normal sinus rhythm, PVC noted, left anterior fascicular block.    Medications:    Infusions: . sodium chloride       Scheduled Medications: . aspirin  325 mg Oral Daily  . atorvastatin  40 mg Oral q1800  . buPROPion  300 mg Oral Daily  . carvedilol  6.25 mg Oral BID WC  . enoxaparin (LOVENOX) injection  40 mg Subcutaneous Q24H  . furosemide  20 mg Oral Daily  . insulin aspart  0-9 Units Subcutaneous TID WC  . loperamide  4 mg Oral Once  . pantoprazole  40 mg Oral Daily  . sodium chloride  3 mL Intravenous Q12H     PRN Medications: acetaminophen, acetaminophen, loperamide, ondansetron (ZOFRAN) IV, ondansetron    Assessment/ Plan:    1.   Chest pain & ventricular tachyarrhythmia:  Telemetry monitoring confirms continued runs of NSVT overnight.  Coronary ischemia and pulmonary embolism have been effectively ruled out.  Cardiology has recommended restarting carvedilol and stopping amlodipine, which we have done.  Cardiology will continue to follow.  Echo shows EF 50-55% with g1 diastolic dysfunction, but no evidence of outflow obstruction.  TSH normal. - Cardiology following.  Would patient be a candidate for ICD? - increase coreg to 6.25 mg BID for better AV nodal regulation, in the setting of continued NSVT  2.   Hypotension:  Patient had one episode of  hypotension with blood pressures into the 70s over 50s. This was responsive to fluid bolus and he quickly recovered. He remained on telemetry throughout this episode and was normal sinus rhythm. A repeat EKG was also obtained that demonstrated no change from previous tracings and normal sinus rhythm. Normal telemetry rules out arrhythmia. This episode was a 1-time event, and has not recurred.  The etiology is unclear, but may have represented mild volume depletion vs autonomic dysfunction -symptoms have resolved  3.   Diarrhea and abdominal pain:  Most likely represents gastroenteritis.  Last abx dose was 3 weeks ago; he took a 10 day course of oral abx for bronchitis.  C diff negative.  Appears to be resolving. - imodium prn - encouraged patient to eat a solid diet  4.   Diabetes mellitus, type 2:  At home, he was taking metformin 1000 mg daily. We have held this and switched him to sensitive scale correctional insulin aspart.  5.   Hypertension, essential:  At home, he was taking amlodipine 5 mg daily and furosemide 20 mg daily. We have stopped the amlodipine but we will continue his furosemide 20 mg. He appears clinically euvolemic.  Carvedilol has been added.  Cardiology suggests an ACEi should additional blood pressure control be required. - Continue furosemide 20 mg daily  - Stop amlodipine - continue coreg, increased dose to 6.25 mg  6.   Cardiovascular disease risk:  We do not have a lipid panel to stratify his individuals risk. At home, he was taking aspirin 325 mg daily and rosuvastatin 20 mg daily. No evidence of coronary artery disease. - Continue aspirin 325 mg daily  - While hospitalized, replaced rosuvastatin with atorvastatin 40 mg daily   7  . Chronic diastolic dysfunction:  Echo shows grade 1 diastolic dysfunction We are continuing his daily furosemide.   8.   Prophylaxis:  Enoxaparin   9.   Disposition:  She will followup with PCP Dr. Cyndia Bent. If patient tolerates a regular  diet and no further interventions are planned by cardiology, patient may be able to be discharged home later today.    Length of Stay: 2 days   Signed by: Janalyn Harder, MD 06/11/2012, 10:35 AM

## 2012-06-11 NOTE — Discharge Summary (Signed)
Internal Medicine Teaching [facility] Discharge Note  Name: Karl Peters MRN: [id number] DOB: 24-Mar-1940 72 y.o.  Date of Admission: 06/09/2012 11:15 AM Date of Discharge: 06/11/2012 Attending Physician: Karl Blue, DO  Discharge Diagnosis: 1. Non-sustained Ventricular Tachycardia - started coreg 2. Viral Gastroenteritis 3. Hypotension 4. Diabetes Mellitus, type 2 5. Chronic Diastolic Dysfunction  Discharge Medications:   Medication List    STOP taking these medications       amLODipine 5 MG tablet  Commonly known as:  NORVASC      TAKE these medications       aspirin 325 MG tablet  Take 325 mg by mouth daily.     buPROPion 300 MG 24 hr tablet  Commonly known as:  WELLBUTRIN XL  Take 300 mg by mouth daily.     carvedilol 6.25 MG tablet  Commonly known as:  COREG  Take 1 tablet (6.25 mg total) by mouth 2 (two) times daily with a meal.     furosemide 20 MG tablet  Commonly known as:  LASIX  Take 20 mg by mouth daily.     loperamide 2 MG capsule  Commonly known as:  IMODIUM  Take 1 capsule (2 mg total) by mouth every 6 (six) hours as needed for diarrhea or loose stools.     metFORMIN 1000 MG tablet  Commonly known as:  GLUCOPHAGE  Take 1,000 mg by mouth daily with breakfast.     ondansetron 4 MG tablet  Commonly known as:  ZOFRAN  Take 1 tablet (4 mg total) by mouth every 8 (eight) hours as needed for nausea.     pantoprazole 40 MG tablet  Commonly known as:  PROTONIX  Take 40 mg by mouth daily.     rosuvastatin 20 MG tablet  Commonly known as:  CRESTOR  Take 20 mg by mouth daily.        Disposition and follow-up:   [name] was discharged from [facility] in stable and improved condition, with improvement in chest pain.  At hospital follow-up, please address the following: 1. Consider addition of ACE-inhibitor if needed for BP control  Follow-up Appointments: Follow-up Information   Follow up with Peters,Karl  C, MD. Schedule an appointment as soon as possible for a visit in 2 weeks.   Contact information:   [address] [place] [address] [phone]       Follow up with Karl Pates, MD. Schedule an appointment as soon as possible for a visit in 2 weeks.   Contact information:   [address] [place] [address] [phone]           Discharge Orders   Future Orders Complete By Expires     Call MD for:  difficulty breathing, headache or visual disturbances  As directed     Call MD for:  persistant nausea and vomiting  As directed     Call MD for:  temperature >100.4  As directed     Diet - low sodium heart healthy  As directed     Discharge instructions  As directed     Comments:      You were hospitalized for chest discomfort, caused by Ventricular Tachycardia.  To treat this, we have started you on Carvedilol (aka Coreg), 6.25 mg twice per day, which is a medication that can control your heart rate.  Carvedilol is also a blood pressure medication, so we are discontinuing Amlodipine, your prior blood  pressure medication, so that we can better adjust Carvedilol without decreasing your blood pressure too much.  Follow-up with your Primary Physician to further adjust these medications.  You also experienced an episode of viral gastroenteritis, which can be spread person-to-person.  Be sure to continue to eat and drink plenty of food and water, and wash your hands thoroughly before eating.    Increase activity slowly  As directed        Consultations: Cardiology [facility])  Procedures Performed:  Ct Angio Chest Pe W/cm &/or Wo Cm  06/09/2012  *RADIOLOGY REPORT*  Clinical Data: Chest pain  CT ANGIOGRAPHY CHEST  Technique:  Multidetector CT imaging of the chest using the standard protocol during bolus administration of intravenous contrast. Multiplanar reconstructed images including MIPs were obtained and reviewed to evaluate the vascular anatomy.  Contrast:  OMNIPAQUE IOHEXOL 350 MG/ML SOLN  Comparison: None.  Findings: There are no filling defects in the pulmonary arterial tree to suggest acute pulmonary thromboembolism.  Opacification of the aorta is limited.  No obvious aortic dissection or intramural hematoma.  No evidence of aortic aneurysm.  Mild atherosclerotic calcifications.  This involves the aortic arch and the left anterior descending coronary artery.  No evidence of abnormal adenopathy.  No pericardial effusion.  No pleural effusion.  No pneumothorax.  3 mm subpleural nodule in the lingula on image 51.  No acute bony deformity. Left paracentral posterior T7-T8 disc osteophytes do encroach upon the central canal.  IMPRESSION: No evidence of acute pulmonary thromboembolism.   Original Report Authenticated By: Jolaine Click, M.D.    Dg Chest Port 1 View  06/09/2012  *RADIOLOGY REPORT*  Clinical Data: Chest pain.  PORTABLE CHEST - 1 VIEW  Comparison: None.  Findings: The heart size is normal.  The lung volumes are low. Mild bibasilar atelectasis is evident.  No significant airspace consolidation is present.  The visualized soft tissues and bony thorax are unremarkable.  IMPRESSION:  1.  Low lung volumes and mild bibasilar atelectasis. 2.  No other acute cardiopulmonary disease.   Original Report Authenticated By: Marin Roberts, M.D.     2D Echo:  Study Conclusions - Left ventricle: The cavity size was normal. Wall thickness was normal. Systolic function was normal. The estimated ejection fraction was in the range of 50% to 55%. Regional wall motion abnormalities cannot be excluded. Doppler parameters are consistent with abnormal left ventricular relaxation (grade 1 diastolic dysfunction). - Left atrium: The atrium was mildly dilated. Impressions: - Technically difficult; definity used; LV function appears to be preserved; focal wall motion abnormality cannot be excluded; suggest MUGA or cardiac MRI if  clinically indicated.  Admission HPI:  This is a 72 year old man with HTN, HLD, DM, and a history of an arrhythmia presenting to the ED with nausea, chest pain, and palpitations. Last night around 18:00 he was eating dinner and began feeling nauseated. This continued until he laid down around 20:30 for bed. He then began experiencing palpitations and a chest heaviness. The palpitations is described as racing heart alternating with skipped beats. The palpitations resolved just before arriving to his PCP's office this morning. The chest pain was a substernal chest heaviness, nonexertional in nature, 2/10 in severity, no radiation, and persistent through the night last night until this morning when he presented to his PCP. The nausea resolved just before arriving at his PCP's office.  On review of systems, he reports years of an intermittent gastrointestinal complaint of tenesmus and abdominal pain. He has has  this worked up in the past with normal colonoscopy.   Admission Physical Exam VITALS: BP 127/68, HR 83, RR 20, SpO2 98% on room air, temperature 98.38F  GENERAL: overweight; no acute distress  HEAD: atraumatic, normocephalic  EYES: pupils equal, round and reactive; sclera anicteric; normal conjunctiva  EARS: canals patent and TMs normal bilaterally  NOSE/THROAT: oropharynx clear, moist mucous membranes, pink gums, normal dentition  NECK: supple, no carotid bruits, thyroid normal in size and without palpable nodules  LYMPH: no cervical or supraclavicular lymphadenopathy  LUNGS: clear to auscultation bilaterally, normal work of breathing  HEART: normal rate and regular rhythm; normal S1 and S2 without S3 or S4; no murmurs, rubs, or clicks  PULSES: radial 2+ and symmetric  ABDOMEN: soft, non-tender, normal bowel sounds, no masses or organomegaly  SKIN: warm, dry, intact, normal turgor, no rashes  EXTREMITIES: no peripheral edema, clubbing, or cyanosis  PSYCH: patient is alert and oriented, mood  and affect are normal and congruent, thought content is normal without delusions, thought process is linear, speech is normal and non-pressured, behavior is normal, judgement and insight are normal   Admission Labs Basic Metabolic Panel:   06/09/12 1226   NA  139   K  3.9   CL  104   GLUCOSE  143*   BUN  16   CREATININE  1.10   CBC:   06/09/12 1205  06/09/12 1226   WBC  8.3  --   NEUTROABS  6.9  --   HGB  12.8*  14.6   HCT  39.9  43.0   MCV  72.5*  --   PLT  273  --   Cardiac Enzymes:   06/09/12 1205   TROPONINI  <0.30    Hospital Course by problem list: 1. Non-sustained Ventricular Tachycardia - The patient presented with chest discomfort, and was found to have episodes of non-sustained VT.  Of note, the patient has a history of similar symptoms several years ago, which improved after starting Coreg.  The patient was evaluated by [facility], and was restarted on Coreg, with improvement of symptoms.  The patient was deemed not a candidate for ICD placement.  2. Viral Gastroenteritis - The patient developed symptoms of nausea and diarrhea on day 1 of his hospitalization.  C diff was checked, and found to be negative.  The patient's symptoms spontaneously improved by day 3 of hospitalization, suggesting viral gastroenteritis as the cause of his symptoms.  He was treated with IV fluids, zofran, and later imodium, with improvement in symptoms, able to tolerate a regular diet by the day of discharge.  3. Hypotension - On admission, the patient experienced a brief (approx 2-5 minute) episode of hypotension to a BP of 72/58 while lying in bed, with symptoms of lightheadedness.  This episode resolved after a fluid bolus, and did not recur during the course of the patient's hospitalization.  The patient's orthostatic vital signs were checked, and did not show evidence of orthostatic hypotension.  The etiology of this short, self-limited event is still unclear, but may have represented  mild volume depletion on admission.  4. Diabetes Mellitus, type 2 - The patient has a history of Diabetes.  He was maintained on sliding scale insulin during his hospitalization, and was discharged on his home metformin.  Time spent on discharge: 45 minutes  Discharge Vitals:  BP 118/72  Pulse 64  Temp(Src) 98.2 F (36.8 C) (Oral)  Resp 18  Ht 5\' 8"  (1.727 m)  Wt 278  lb 4.8 oz (126.236 kg)  BMI 42.33 kg/m2  SpO2 96%  Discharge Labs:  Results for orders placed during the hospital encounter of 06/09/12 (from the past 24 hour(s))  CLOSTRIDIUM DIFFICILE BY PCR     Status: None   Collection Time    06/10/12  3:20 PM      Result Value Range   C difficile by pcr NEGATIVE  NEGATIVE  GLUCOSE, CAPILLARY     Status: Abnormal   Collection Time    06/10/12  4:25 PM      Result Value Range   Glucose-Capillary 104 (*) 70 - 99 mg/dL   Comment 1 Notify RN    GLUCOSE, CAPILLARY     Status: Abnormal   Collection Time    06/10/12  9:02 PM      Result Value Range   Glucose-Capillary 122 (*) 70 - 99 mg/dL   Comment 1 Notify RN    GLUCOSE, CAPILLARY     Status: Abnormal   Collection Time    06/10/12  9:02 PM      Result Value Range   Glucose-Capillary 122 (*) 70 - 99 mg/dL   Comment 1 Notify RN    BASIC METABOLIC PANEL     Status: Abnormal   Collection Time    06/11/12  6:20 AM      Result Value Range   Sodium 135  135 - 145 mEq/L   Potassium 4.0  3.5 - 5.1 mEq/L   Chloride 102  96 - 112 mEq/L   CO2 23  19 - 32 mEq/L   Glucose, Bld 130 (*) 70 - 99 mg/dL   BUN 20  6 - 23 mg/dL   Creatinine, Ser 7.84  0.50 - 1.35 mg/dL   Calcium 9.1  8.4 - 69.6 mg/dL   GFR calc non Af Amer 62 (*) >90 mL/min   GFR calc Af Amer 72 (*) >90 mL/min  CBC WITH DIFFERENTIAL     Status: Abnormal   Collection Time    06/11/12  6:20 AM      Result Value Range   WBC 6.5  4.0 - 10.5 K/uL   RBC 5.26  4.22 - 5.81 MIL/uL   Hemoglobin 12.4 (*) 13.0 - 17.0 g/dL   HCT 29.5 (*) 28.4 - 13.2 %   MCV 72.6 (*) 78.0 -  100.0 fL   MCH 23.6 (*) 26.0 - 34.0 pg   MCHC 32.5  30.0 - 36.0 g/dL   RDW 44.0 (*) 10.2 - 72.5 %   Platelets 242  150 - 400 K/uL   Neutrophils Relative 68  43 - 77 %   Neutro Abs 4.4  1.7 - 7.7 K/uL   Lymphocytes Relative 18  12 - 46 %   Lymphs Abs 1.1  0.7 - 4.0 K/uL   Monocytes Relative 14 (*) 3 - 12 %   Monocytes Absolute 0.9  0.1 - 1.0 K/uL   Eosinophils Relative 1  0 - 5 %   Eosinophils Absolute 0.0  0.0 - 0.7 K/uL   Basophils Relative 0  0 - 1 %   Basophils Absolute 0.0  0.0 - 0.1 K/uL  GLUCOSE, CAPILLARY     Status: Abnormal   Collection Time    06/11/12  7:29 AM      Result Value Range   Glucose-Capillary 132 (*) 70 - 99 mg/dL  GLUCOSE, CAPILLARY     Status: Abnormal   Collection Time    06/11/12 12:19 PM  Result Value Range   Glucose-Capillary 112 (*) 70 - 99 mg/dL    Signed: Janalyn Harder 06/11/2012, 2:48 PM

## 2012-06-11 NOTE — Progress Notes (Signed)
SUBJECTIVE: The patient is doing well today.  At this time, he denies chest pain, shortness of breath, or any new concerns.  Marland Kitchen aspirin  325 mg Oral Daily  . atorvastatin  40 mg Oral q1800  . buPROPion  300 mg Oral Daily  . carvedilol  6.25 mg Oral BID WC  . enoxaparin (LOVENOX) injection  40 mg Subcutaneous Q24H  . furosemide  20 mg Oral Daily  . insulin aspart  0-9 Units Subcutaneous TID WC  . loperamide  4 mg Oral Once  . pantoprazole  40 mg Oral Daily  . sodium chloride  3 mL Intravenous Q12H   . sodium chloride      OBJECTIVE: Physical Exam: Filed Vitals:   06/10/12 2100 06/11/12 0638 06/11/12 0801 06/11/12 1027  BP: 113/62 105/64 123/81 128/77  Pulse: 83 79 76 70  Temp: 98.8 F (37.1 C) 98.3 F (36.8 C)    TempSrc: Oral Oral    Resp: 20 20    Height:      Weight:      SpO2: 96% 94%      Intake/Output Summary (Last 24 hours) at 06/11/12 1144 Last data filed at 06/11/12 1029  Gross per 24 hour  Intake    363 ml  Output      0 ml  Net    363 ml    Telemetry reveals sinus rhythm with NSVT  GEN- The patient is well appearing, alert and oriented x 3 today.   Head- normocephalic, atraumatic Eyes-  Sclera clear, conjunctiva pink Ears- hearing intact Oropharynx- clear Neck- supple, no JVP Lymph- no cervical lymphadenopathy Lungs- Clear to ausculation bilaterally, normal work of breathing Heart- Regular rate and rhythm, no murmurs, rubs or gallops, PMI not laterally displaced GI- soft, NT, ND, + BS Extremities- no clubbing, cyanosis, or edema Skin- no rash or lesion Psych- euthymic mood, full affect Neuro- strength and sensation are intact  LABS: Basic Metabolic Panel:  Recent Labs  16/10/96 0530 06/11/12 0620  NA 137 135  K 4.1 4.0  CL 102 102  CO2 25 23  GLUCOSE 123* 130*  BUN 16 20  CREATININE 1.04 1.16  CALCIUM 8.8 9.1   Liver Function Tests:  Recent Labs  06/10/12 0530  AST 15  ALT 10  ALKPHOS 99  BILITOT 0.3  PROT 6.9  ALBUMIN  3.1*   No results found for this basename: LIPASE, AMYLASE,  in the last 72 hours CBC:  Recent Labs  06/09/12 1205 06/09/12 1226 06/11/12 0620  WBC 8.3  --  6.5  NEUTROABS 6.9  --  4.4  HGB 12.8* 14.6 12.4*  HCT 39.9 43.0 38.2*  MCV 72.5*  --  72.6*  PLT 273  --  242   Cardiac Enzymes:  Recent Labs  06/09/12 1205 06/09/12 1913 06/10/12 0002  TROPONINI <0.30 <0.30 <0.30   BNP: No components found with this basename: POCBNP,  D-Dimer: No results found for this basename: DDIMER,  in the last 72 hours Hemoglobin A1C: No results found for this basename: HGBA1C,  in the last 72 hours Fasting Lipid Panel: No results found for this basename: CHOL, HDL, LDLCALC, TRIG, CHOLHDL, LDLDIRECT,  in the last 72 hours Thyroid Function Tests:  Recent Labs  06/10/12 0530  TSH 1.070   Anemia Panel: No results found for this basename: VITAMINB12, FOLATE, FERRITIN, TIBC, IRON, RETICCTPCT,  in the last 72 hours  RADIOLOGY: Ct Angio Chest Pe W/cm &/or Wo Cm  06/09/2012  *RADIOLOGY REPORT*  Clinical Data: Chest pain  CT ANGIOGRAPHY CHEST  Technique:  Multidetector CT imaging of the chest using the standard protocol during bolus administration of intravenous contrast. Multiplanar reconstructed images including MIPs were obtained and reviewed to evaluate the vascular anatomy.  Contrast: OMNIPAQUE IOHEXOL 350 MG/ML SOLN  Comparison: None.  Findings: There are no filling defects in the pulmonary arterial tree to suggest acute pulmonary thromboembolism.  Opacification of the aorta is limited.  No obvious aortic dissection or intramural hematoma.  No evidence of aortic aneurysm.  Mild atherosclerotic calcifications.  This involves the aortic arch and the left anterior descending coronary artery.  No evidence of abnormal adenopathy.  No pericardial effusion.  No pleural effusion.  No pneumothorax.  3 mm subpleural nodule in the lingula on image 51.  No acute bony deformity. Left paracentral  posterior T7-T8 disc osteophytes do encroach upon the central canal.  IMPRESSION: No evidence of acute pulmonary thromboembolism.   Original Report Authenticated By: Jolaine Click, M.D.    Dg Chest Port 1 View  06/09/2012  *RADIOLOGY REPORT*  Clinical Data: Chest pain.  PORTABLE CHEST - 1 VIEW  Comparison: None.  Findings: The heart size is normal.  The lung volumes are low. Mild bibasilar atelectasis is evident.  No significant airspace consolidation is present.  The visualized soft tissues and bony thorax are unremarkable.  IMPRESSION:  1.  Low lung volumes and mild bibasilar atelectasis. 2.  No other acute cardiopulmonary disease.   Original Report Authenticated By: Marin Roberts, M.D.     ASSESSMENT AND PLAN:  Principal Problem:   Hypotension Active Problems:   DM   Chest pressure  1. Atypical chest pain No further workup planned  2.  NSVT Asymptomatic Resume coreg.  Could increase to 6.25mg  BID at discharge. No indication for ICD  OK to discharge from an cardiology standpoint.  Will see as needed while here.    [name], MD 06/11/2012 11:44 AM

## 2012-06-11 NOTE — Progress Notes (Signed)
Utilization review completed.  P.J. Minnich,RN,BSN Case Manager [phone]  

## 2012-06-11 NOTE — Progress Notes (Signed)
Pt was resting in bed and had a 9 beat run of V tach. V/S stable. Pt denied any SOB or CP. Md on call made aware. Will cont to monitor pt.

## 2012-06-13 NOTE — Discharge Summary (Signed)
INTERNAL MEDICINE TEACHING SERVICE Attending Note  Date: 06/13/2012  Patient name: Karl Peters  Medical record number: [id number]  Date of birth: [birth year]   This patient has been discussed with the house staff. Please see their note for complete details. I concur with their findings and plan.   The treatment plan was discussed in detail with the patient.  Alternatives to treatment, side effects, risks and benefits, and complications were discussed with the patient. Informed consent was obtained. The patient agrees to proceed with the current treatment plan.  Jonah Blue, DO  06/13/2012, 9:58 AM

## 2012-06-14 DIAGNOSIS — E11319 Type 2 diabetes mellitus with unspecified diabetic retinopathy without macular edema: Secondary | ICD-10-CM | POA: Insufficient documentation

## 2012-07-08 ENCOUNTER — Encounter: Payer: Self-pay | Admitting: Physician Assistant

## 2012-07-08 ENCOUNTER — Ambulatory Visit (INDEPENDENT_AMBULATORY_CARE_PROVIDER_SITE_OTHER): Payer: Medicare Other | Admitting: Physician Assistant

## 2012-07-08 VITALS — BP 160/98 | HR 70 | Ht 68.0 in | Wt 275.0 lb

## 2012-07-08 DIAGNOSIS — I4729 Other ventricular tachycardia: Secondary | ICD-10-CM

## 2012-07-08 DIAGNOSIS — E785 Hyperlipidemia, unspecified: Secondary | ICD-10-CM

## 2012-07-08 DIAGNOSIS — I10 Essential (primary) hypertension: Secondary | ICD-10-CM

## 2012-07-08 DIAGNOSIS — R079 Chest pain, unspecified: Secondary | ICD-10-CM

## 2012-07-08 DIAGNOSIS — I472 Ventricular tachycardia: Secondary | ICD-10-CM

## 2012-07-08 MED ORDER — CARVEDILOL 12.5 MG PO TABS: 12.5000 mg | ORAL_TABLET | Freq: Two times a day (BID) | ORAL | Status: DC

## 2012-07-08 NOTE — Patient Instructions (Addendum)
Increase Coreg to 12.5 mg twice a day (you can take 2 of the 6.25 mg tabs to = 12.5 mg). A new prescription was sent to your pharmacy. Follow up with Dr. [name] in 3 months.

## 2012-07-08 NOTE — Progress Notes (Signed)
[address] [place], [place]  [address] Phone: [phone] Fax:  [phone]  Date:  07/08/2012   ID:  Karl Peters, DOB October 02, 1940, MRN [id number]  PCP:  Eartha Inch, MD  Cardiologist:  Dr. [name]     History of Present Illness: Karl Peters is a 72 y.o. male who returns for follow up after a recent admission to the hospital for chest pain and NSVT.  He has a hx of symptomatic PVCs and VTach by holter monitor.  He has a history of normal coronary arteries by cardiac catheterization in 1998 and 02/2008. He has a history of DM2, HTN, sleep apnea.  Echo in 02/2008 demonstrated normal LV function.  Last seen by Dr. Johney Frame 05/2008.   Patient was admitted 4/24-4/26 after developing mid sternal chest pressure suspicious for indigestion with associated nausea. Nonsustained ventricular tachycardia was noted on telemetry. He had previously been on carvedilol. This was restarted. Patient was also treated for viral gastroenteritis. Chest CT demonstrated no evidence of pulmonary embolism. Myocardial infarction was ruled out. Echocardiogram 06/10/12: Normal LV wall thickness, EF 50-55%, grade 1 diastolic dysfunction, mild LAE.  Chest pain was felt to be atypical and no further workup was planned. Dr. Johney Frame saw the patient and felt there was no indication for ICD and recommended continued beta blocker therapy.  He is doing well.  Has been weak from gastroenteritis but feels better.  The patient denies chest pain, shortness of breath, syncope, orthopnea, PND or significant pedal edema.  He is NYHA class II.  Labs (4/14):  K 4. Cr 1.16, ALT 10, Hgb 12.4, TSH 1.070  Wt Readings from Last 3 Encounters:  07/08/12 275 lb (124.739 kg)  06/10/12 278 lb 4.8 oz (126.236 kg)  05/28/08 286 lb (129.729 kg)     Past Medical History  Diagnosis Date  . Ventricular tachycardia     and symptomatic PVCs - Rx with beta blocker  . Hx of cardiac catheterization     a. normal cors by cath  in 1998 and 2010  . Dyslipidemia   . Diabetes mellitus   . Obesity   . Hypertension   . Hx of echocardiogram     Echocardiogram 06/10/12: Normal LV wall thickness, EF 50-55%, grade 1 diastolic dysfunction, mild LAE    Current Outpatient Prescriptions  Medication Sig Dispense Refill  . aspirin 325 MG tablet Take 325 mg by mouth daily.      Marland Kitchen buPROPion (WELLBUTRIN XL) 300 MG 24 hr tablet Take 300 mg by mouth daily.      . carvedilol (COREG) 6.25 MG tablet Take 1 tablet (6.25 mg total) by mouth 2 (two) times daily with a meal.  60 tablet  1  . furosemide (LASIX) 20 MG tablet Take 20 mg by mouth daily.       Marland Kitchen loperamide (IMODIUM) 2 MG capsule Take 1 capsule (2 mg total) by mouth every 6 (six) hours as needed for diarrhea or loose stools.  30 capsule  0  . metFORMIN (GLUCOPHAGE) 1000 MG tablet Take 1,000 mg by mouth daily with breakfast.      . ondansetron (ZOFRAN) 4 MG tablet Take 1 tablet (4 mg total) by mouth every 8 (eight) hours as needed for nausea.  30 tablet  0  . pantoprazole (PROTONIX) 40 MG tablet Take 40 mg by mouth daily.      . rosuvastatin (CRESTOR) 20 MG tablet Take 20 mg by mouth daily.  No current facility-administered medications for this visit.    Allergies:   No Known Allergies  Social History:  The patient  reports that he has never smoked. He does not have any smokeless tobacco history on file. He reports that he does not drink alcohol.   ROS:  Please see the history of present illness.     All other systems reviewed and negative.   PHYSICAL EXAM: VS:  BP 160/98  Pulse 70  Ht 5\' 8"  (1.727 m)  Wt 275 lb (124.739 kg)  BMI 41.82 kg/m2 Well nourished, well developed, in no acute distress HEENT: normal Neck: no JVD Vascular: no carotid bruits Cardiac:  normal S1, S2; RRR; no murmur Lungs:  clear to auscultation bilaterally, no wheezing, rhonchi or rales Abd: soft, nontender, no hepatomegaly Ext: no edema Skin: warm and dry Neuro:  CNs 2-12 intact, no  focal abnormalities noted  EKG:  NSR, HR 72, LAD, no acute changes     ASSESSMENT AND PLAN:  1. Chest Pain:  Resolved.  Atypical for ischemia.  No further work up. 2. NSVT:  He has a hx of symptomatic PVCs.  No further symptoms since restarting beta blocker.  LVF normal by recent echo.  Continue with Coreg. 3. Hypertension:  Uncontrolled.  Increase Coreg to 12.5 mg bid.   4. Hyperlipidemia:  Continue statin. 5. Diabetes Mellitus:  Managed by PCP.  6. Viral Gastroenteritis:  Resolved. 7. Disposition:  F/u with Dr. [name] in 3 mos.   Signed, Tereso Newcomer, PA-C  07/08/2012 10:58 AM

## 2012-09-26 ENCOUNTER — Ambulatory Visit: Payer: Medicare Other | Admitting: Internal Medicine

## 2012-11-07 ENCOUNTER — Ambulatory Visit (INDEPENDENT_AMBULATORY_CARE_PROVIDER_SITE_OTHER): Payer: Medicare Other | Admitting: Internal Medicine

## 2012-11-07 ENCOUNTER — Encounter: Payer: Self-pay | Admitting: Internal Medicine

## 2012-11-07 VITALS — BP 144/86 | HR 67 | Ht 68.0 in | Wt 278.0 lb

## 2012-11-07 DIAGNOSIS — I10 Essential (primary) hypertension: Secondary | ICD-10-CM

## 2012-11-07 DIAGNOSIS — I4729 Other ventricular tachycardia: Secondary | ICD-10-CM

## 2012-11-07 DIAGNOSIS — I472 Ventricular tachycardia: Secondary | ICD-10-CM

## 2012-11-07 DIAGNOSIS — I4949 Other premature depolarization: Secondary | ICD-10-CM

## 2012-11-07 NOTE — Patient Instructions (Signed)
Your physician wants you to follow-up in: 6 months with Scott Weaver, PA and 12 months with Dr Allred You will receive a reminder letter in the mail two months in advance. If you don't receive a letter, please call our office to schedule the follow-up appointment.  

## 2012-11-07 NOTE — Progress Notes (Signed)
PCP: Eartha Inch, MD  Karl Peters is a 71 y.o. male who presents today for routine electrophysiology followup.  Since last being seen in our clinic, the patient reports doing very well.  He has mild fatigue with coreg.  His NSVT/PVCs have resolved.  Today, he denies symptoms of palpitations, chest pain, shortness of breath,  lower extremity edema, dizziness, presyncope, or syncope.  The patient is otherwise without complaint today.   Past Medical History  Diagnosis Date  . Ventricular tachycardia     and symptomatic PVCs - Rx with beta blocker  . Hx of cardiac catheterization     a. normal cors by cath in 1998 and 2010  . Dyslipidemia   . Diabetes mellitus   . Obesity   . Hypertension   . Hx of echocardiogram     Echocardiogram 06/10/12: Normal LV wall thickness, EF 50-55%, grade 1 diastolic dysfunction, mild LAE   No past surgical history on file.  Current Outpatient Prescriptions  Medication Sig Dispense Refill  . aspirin 325 MG tablet Take 325 mg by mouth daily.      Marland Kitchen buPROPion (WELLBUTRIN XL) 300 MG 24 hr tablet Take 300 mg by mouth daily.      . carvedilol (COREG) 12.5 MG tablet Take 1 tablet (12.5 mg total) by mouth 2 (two) times daily with a meal.  60 tablet  5  . furosemide (LASIX) 20 MG tablet Take 20 mg by mouth daily.       . metFORMIN (GLUCOPHAGE) 1000 MG tablet Take 1,000 mg by mouth daily with breakfast.      . pantoprazole (PROTONIX) 40 MG tablet Take 40 mg by mouth daily.      . rosuvastatin (CRESTOR) 20 MG tablet Take 20 mg by mouth daily.       No current facility-administered medications for this visit.    Physical Exam: Filed Vitals:   11/07/12 1204  BP: 144/86  Pulse: 67  Height: 5\' 8"  (1.727 m)  Weight: 278 lb (126.1 kg)    GEN- The patient is well appearing, alert and oriented x 3 today.   Head- normocephalic, atraumatic Eyes-  Sclera clear, conjunctiva pink Ears- hearing intact Oropharynx- clear Lungs- Clear to ausculation bilaterally,  normal work of breathing Heart- Regular rate and rhythm, no murmurs, rubs or gallops, PMI not laterally displaced GI- soft, NT, ND, + BS Extremities- no clubbing, cyanosis, or edema  ekg today reveals sinus rhythmPR 198, LAHB  Assessment and Plan:  1. PVCs/NSVT Well controllefd  2. HTN Stable No change required today  Return to see Karl Peters in 6 months I will see in a yer

## 2013-01-03 DIAGNOSIS — R351 Nocturia: Secondary | ICD-10-CM | POA: Insufficient documentation

## 2013-06-21 ENCOUNTER — Other Ambulatory Visit: Payer: Self-pay | Admitting: *Deleted

## 2013-06-21 DIAGNOSIS — I10 Essential (primary) hypertension: Secondary | ICD-10-CM

## 2013-06-21 DIAGNOSIS — I4729 Other ventricular tachycardia: Secondary | ICD-10-CM

## 2013-06-21 DIAGNOSIS — I472 Ventricular tachycardia: Secondary | ICD-10-CM

## 2013-06-21 MED ORDER — CARVEDILOL 12.5 MG PO TABS: 12.5000 mg | ORAL_TABLET | Freq: Two times a day (BID) | ORAL | Status: DC

## 2014-09-06 ENCOUNTER — Encounter: Payer: Self-pay | Admitting: Internal Medicine

## 2014-09-07 ENCOUNTER — Encounter: Payer: Self-pay | Admitting: Internal Medicine

## 2014-10-01 ENCOUNTER — Encounter (HOSPITAL_COMMUNITY): Admission: RE | Payer: Self-pay | Source: Ambulatory Visit

## 2014-10-01 ENCOUNTER — Ambulatory Visit (HOSPITAL_COMMUNITY): Admission: RE | Admit: 2014-10-01 | Payer: Medicare Other | Source: Ambulatory Visit | Admitting: Gastroenterology

## 2014-10-01 SURGERY — IMAGING PROCEDURE, GI TRACT, INTRALUMINAL, VIA CAPSULE
Anesthesia: LOCAL

## 2015-01-24 DIAGNOSIS — D519 Vitamin B12 deficiency anemia, unspecified: Secondary | ICD-10-CM | POA: Insufficient documentation

## 2015-01-24 DIAGNOSIS — E538 Deficiency of other specified B group vitamins: Secondary | ICD-10-CM | POA: Insufficient documentation

## 2015-11-04 ENCOUNTER — Ambulatory Visit
Admission: RE | Admit: 2015-11-04 | Discharge: 2015-11-04 | Disposition: A | Discharge status: Home / Self Care | Payer: Medicare Other | Source: Ambulatory Visit | Attending: Nurse Practitioner | Admitting: Nurse Practitioner

## 2015-11-04 ENCOUNTER — Other Ambulatory Visit: Payer: Self-pay | Admitting: Nurse Practitioner

## 2015-11-04 DIAGNOSIS — R0600 Dyspnea, unspecified: Secondary | ICD-10-CM

## 2015-11-04 DIAGNOSIS — R0609 Other forms of dyspnea: Secondary | ICD-10-CM

## 2015-12-09 DIAGNOSIS — E088 Diabetes mellitus due to underlying condition with unspecified complications: Secondary | ICD-10-CM | POA: Insufficient documentation

## 2015-12-27 HISTORY — PX: LEFT HEART CATH: CATH118248

## 2017-10-06 ENCOUNTER — Encounter: Payer: Self-pay | Admitting: *Deleted

## 2018-02-04 ENCOUNTER — Ambulatory Visit (INDEPENDENT_AMBULATORY_CARE_PROVIDER_SITE_OTHER): Payer: Medicare Other

## 2018-02-04 ENCOUNTER — Encounter (INDEPENDENT_AMBULATORY_CARE_PROVIDER_SITE_OTHER): Payer: Self-pay | Admitting: Family Medicine

## 2018-02-04 ENCOUNTER — Ambulatory Visit (INDEPENDENT_AMBULATORY_CARE_PROVIDER_SITE_OTHER): Payer: Medicare Other | Admitting: Family Medicine

## 2018-02-04 VITALS — Ht 68.0 in | Wt 287.0 lb

## 2018-02-04 DIAGNOSIS — M25552 Pain in left hip: Secondary | ICD-10-CM

## 2018-02-04 DIAGNOSIS — M25562 Pain in left knee: Secondary | ICD-10-CM | POA: Diagnosis not present

## 2018-02-04 MED ORDER — ETODOLAC 400 MG PO TABS: 400.0000 mg | ORAL_TABLET | Freq: Two times a day (BID) | ORAL | 3 refills | Status: DC | PRN

## 2018-02-04 NOTE — Patient Instructions (Signed)
   Glucosamine Sulfate:  1,000 mg twice daily    
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
## 2018-05-16 ENCOUNTER — Other Ambulatory Visit: Payer: Self-pay

## 2018-05-16 ENCOUNTER — Ambulatory Visit (INDEPENDENT_AMBULATORY_CARE_PROVIDER_SITE_OTHER): Payer: Medicare Other | Admitting: Family Medicine

## 2018-05-16 ENCOUNTER — Encounter (INDEPENDENT_AMBULATORY_CARE_PROVIDER_SITE_OTHER): Payer: Self-pay | Admitting: Family Medicine

## 2018-05-16 DIAGNOSIS — G8929 Other chronic pain: Secondary | ICD-10-CM

## 2018-05-16 DIAGNOSIS — M25562 Pain in left knee: Secondary | ICD-10-CM | POA: Diagnosis not present

## 2018-05-16 MED ORDER — METHYLPREDNISOLONE ACETATE 40 MG/ML IJ SUSP: 40.0000 mg | Freq: Once | INTRAMUSCULAR | Status: DC

## 2018-05-16 NOTE — Progress Notes (Signed)
Office Visit Note   Patient: Karl Peters           Date of Birth: [birth year]           MRN: [id number] Visit Date: 05/16/2018 Requested by: Eartha Inch, MD [address] [place], [place] [address] PCP: Eartha Inch, MD  Subjective: No chief complaint on file.   HPI: He is here with persistent left knee pain.  Pain when walking long distance, sometimes it wakes him up at night.  He has to sleep with his knee partially bent.  No locking or giving way.  He wonders whether a cortisone injection might help.              ROS: Denies fevers or chills.  All other systems were reviewed and are negative.  Objective: Vital Signs: There were no vitals taken for this visit.  Physical Exam:  General:  Alert and oriented, in no acute distress. Pulm:  Breathing unlabored. Psy:  Normal mood, congruent affect. Skin: No rash on his skin. Left knee: 1+ effusion, no warmth or erythema.  Full active extension and flexion of 120 degrees.  Tender still on the medial joint line, no palpable click with McMurray's.  Imaging: None today.  Assessment & Plan: 1.  Persistent left knee pain with DJD and possible medial meniscus tear -Discussed with patient and elected to inject with cortisone.  Follow-up as needed.     Procedures: Left knee steroid injection: After sterile prep with Betadine, injected 3 cc 1% lidocaine without epinephrine and 40 mg methylprednisolone from superolateral approach, a flash of clear yellow synovial fluid was obtained prior to injection.    PMFS History: Patient Active Problem List   Diagnosis Date Noted  . Hypotension 06/09/2012  . Chest pressure 06/09/2012  . DM 05/24/2008  . DEPRESSION 05/24/2008  . HYPERTENSION, UNSPECIFIED 05/24/2008  . VENTRICULAR TACHYCARDIA 05/24/2008  . PREMATURE VENTRICULAR CONTRACTIONS 05/24/2008  . VASOVAGAL SYNCOPE 05/24/2008   Past Medical History:  Diagnosis Date  . Diabetes mellitus   . Dyslipidemia   . Hx of  cardiac catheterization    a. normal cors by cath in 1998 and 2010  . Hx of echocardiogram    Echocardiogram 06/10/12: Normal LV wall thickness, EF 50-55%, grade 1 diastolic dysfunction, mild LAE  . Hypertension   . Obesity   . Ventricular tachycardia (HCC)    and symptomatic PVCs - Rx with beta blocker    Family History  Problem Relation Age of Onset  . Heart attack Father 74       Died at age 39 from heart attack  . Heart failure Mother 33       Died from heart failure  . Lymphoma Mother   . Diabetes Mother   . Diabetes Mellitus II Maternal Grandmother   . Other Maternal Grandfather        head injury  . Prostate cancer Paternal Grandfather   . Depression Daughter   . Rheum arthritis Daughter   . Anxiety disorder Daughter     Past Surgical History:  Procedure Laterality Date  . ESOPHAGOGASTRODUODENOSCOPY  06/18/2003   negative  . LEFT HEART CATH  12/27/2015   Procedure: Left Heart Catheterization W Intervention; Surgeon: Arizona Constable, MD; Location: [facility] CATH; Service: Cardiology     Social History   Occupational History  . Not on file  Tobacco Use  . Smoking status: Never Smoker  . Smokeless tobacco: Never Used  Substance and Sexual Activity  .  Alcohol use: No  . Drug use: Not on file  . Sexual activity: Not on file

## 2019-11-20 ENCOUNTER — Other Ambulatory Visit: Payer: Self-pay

## 2019-11-20 ENCOUNTER — Ambulatory Visit: Payer: Self-pay

## 2019-11-20 ENCOUNTER — Ambulatory Visit (INDEPENDENT_AMBULATORY_CARE_PROVIDER_SITE_OTHER): Payer: Medicare Other | Admitting: Family Medicine

## 2019-11-20 ENCOUNTER — Encounter: Payer: Self-pay | Admitting: Family Medicine

## 2019-11-20 DIAGNOSIS — M5442 Lumbago with sciatica, left side: Secondary | ICD-10-CM | POA: Diagnosis not present

## 2019-11-20 DIAGNOSIS — M5441 Lumbago with sciatica, right side: Secondary | ICD-10-CM

## 2019-11-20 MED ORDER — METHYLPREDNISOLONE 4 MG PO TBPK: ORAL_TABLET | ORAL | 0 refills | Status: DC

## 2019-11-20 MED ORDER — BACLOFEN 10 MG PO TABS: 5.0000 mg | ORAL_TABLET | Freq: Three times a day (TID) | ORAL | 3 refills | Status: DC | PRN
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
## 2020-05-28 ENCOUNTER — Encounter: Payer: Self-pay | Admitting: Family Medicine

## 2020-05-28 ENCOUNTER — Ambulatory Visit (INDEPENDENT_AMBULATORY_CARE_PROVIDER_SITE_OTHER): Payer: Medicare Other | Admitting: Family Medicine

## 2020-05-28 ENCOUNTER — Other Ambulatory Visit: Payer: Self-pay

## 2020-05-28 ENCOUNTER — Ambulatory Visit: Payer: Self-pay

## 2020-05-28 DIAGNOSIS — M79642 Pain in left hand: Secondary | ICD-10-CM | POA: Diagnosis not present

## 2020-05-28 DIAGNOSIS — M25561 Pain in right knee: Secondary | ICD-10-CM

## 2020-05-28 NOTE — Progress Notes (Signed)
Office Visit Note   Patient: Karl Peters           Date of Birth: [birth year]           MRN: [id number] Visit Date: 05/28/2020 Requested by: Eartha Inch, MD [address] [place],  [place] [address] PCP: Eartha Inch, MD  Subjective: Chief Complaint  Patient presents with  . Right Knee - Pain    HPI: He is here with right knee pain.  About 3 or 4 weeks ago he slipped and fell, did not have a lot of pain right then but did land on his knee.  Then about a week ago he stepped over a dog bowl and felt sudden severe pain in his right leg from the posterior hip to the knee and down toward the foot.  Since then the pain seems to have settled in the knee, with some swelling and intermittent popping on the medial aspect.  It is difficult to fully extend his knee, and the pain keeps him awake at night.  He also asked me to look at his left thumb.  He has intermittent pain with certain motions.  History of carpal tunnel syndrome which apparently resolved without treatment.               ROS:   All other systems were reviewed and are negative.  Objective: Vital Signs: There were no vitals taken for this visit.  Physical Exam:  General:  Alert and oriented, in no acute distress. Pulm:  Breathing unlabored. Psy:  Normal mood, congruent affect. Skin: No erythema Right knee: 1-2+ effusion with no warmth.  Lachman's feels solid.  No laxity of varus or valgus stress.  There is tenderness on the medial joint line, no palpable click with McMurray's.  Full active extension and flexion of 120 degrees.  No pain with internal hip rotation. Left thumb: He has wasting of the thenar muscle of his thumb but negative Tinel's at the carpal tunnel, negative Phalen's test.  He has some tenderness at the thumb [facility] joint with a positive grind test.    Imaging: XR Knee 1-2 Views Right  Result Date: 05/28/2020 X-rays of the right knee reveal no sign of fracture or loose body.  He has mild to  moderate degenerative change.   Assessment & Plan: 1.  Right knee effusion, question medial meniscus injury -Discussed with him and elected to inject with cortisone today.  Follow-up as needed for this.  2.  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  Injection if pain worsens.     Procedures: Right knee injection: After sterile prep with Betadine, injected 3 cc 0.25% bupivacaine and 40 mg Depo-Medrol from superolateral approach, a flash of clear yellow synovial fluid was obtained prior to injection.       PMFS History: Patient Active Problem List   Diagnosis Date Noted  . Hypotension 06/09/2012  . Chest pressure 06/09/2012  . DM 05/24/2008  . DEPRESSION 05/24/2008  . HYPERTENSION, UNSPECIFIED 05/24/2008  . VENTRICULAR TACHYCARDIA 05/24/2008  . PREMATURE VENTRICULAR CONTRACTIONS 05/24/2008  . VASOVAGAL SYNCOPE 05/24/2008   Past Medical History:  Diagnosis Date  . Diabetes mellitus   . Dyslipidemia   . Hx of cardiac catheterization    a. normal cors by cath in 1998 and 2010  . Hx of echocardiogram    Echocardiogram 06/10/12: Normal LV wall thickness, EF 50-55%, grade 1 diastolic dysfunction, mild LAE  . Hypertension   . Obesity   .  Ventricular tachycardia (HCC)    and symptomatic PVCs - Rx with beta blocker    Family History  Problem Relation Age of Onset  . Heart attack Father 57       Died at age 81 from heart attack  . Heart failure Mother 53       Died from heart failure  . Lymphoma Mother   . Diabetes Mother   . Diabetes Mellitus II Maternal Grandmother   . Other Maternal Grandfather        head injury  . Prostate cancer Paternal Grandfather   . Depression Daughter   . Rheum arthritis Daughter   . Anxiety disorder Daughter     Past Surgical History:  Procedure Laterality Date  . ESOPHAGOGASTRODUODENOSCOPY  06/18/2003   negative  . LEFT HEART CATH  12/27/2015   Procedure: Left Heart Catheterization W Intervention; Surgeon: Arizona Constable, MD; Location: [facility] CATH; Service: Cardiology     Social History   Occupational History  . Not on file  Tobacco Use  . Smoking status: Never Smoker  . Smokeless tobacco: Never Used  Substance and Sexual Activity  . Alcohol use: No  . Drug use: Not on file  . Sexual activity: Not on file

## 2020-05-28 NOTE — Progress Notes (Signed)
Fell 4 weeks ago  Noticed pain two weeks ago after stepping over dog bowl  Laying down flat on bed he has pain below the knee   Pain not as bad today  Tinder on the medial side of right knee  Feels like lateral side of knee is numb

## 2020-06-03 ENCOUNTER — Other Ambulatory Visit: Payer: Self-pay | Admitting: Family Medicine

## 2020-06-03 MED ORDER — TRAMADOL HCL 50 MG PO TABS: 50.0000 mg | ORAL_TABLET | Freq: Four times a day (QID) | ORAL | 0 refills | Status: DC | PRN

## 2020-10-07 ENCOUNTER — Emergency Department (HOSPITAL_COMMUNITY): Payer: Medicare Other

## 2020-10-07 ENCOUNTER — Observation Stay (HOSPITAL_COMMUNITY): Payer: Medicare Other

## 2020-10-07 ENCOUNTER — Encounter (HOSPITAL_COMMUNITY): Payer: Self-pay

## 2020-10-07 ENCOUNTER — Inpatient Hospital Stay (HOSPITAL_COMMUNITY)
Admission: EM | Admit: 2020-10-07 | Discharge: 2020-10-14 | DRG: 683 | Disposition: A | Discharge status: Home / Self Care | Payer: Medicare Other | Attending: Family Medicine | Admitting: Family Medicine

## 2020-10-07 DIAGNOSIS — I493 Ventricular premature depolarization: Secondary | ICD-10-CM | POA: Diagnosis present

## 2020-10-07 DIAGNOSIS — I499 Cardiac arrhythmia, unspecified: Secondary | ICD-10-CM | POA: Diagnosis present

## 2020-10-07 DIAGNOSIS — I959 Hypotension, unspecified: Secondary | ICD-10-CM | POA: Diagnosis present

## 2020-10-07 DIAGNOSIS — Z20822 Contact with and (suspected) exposure to covid-19: Secondary | ICD-10-CM | POA: Diagnosis present

## 2020-10-07 DIAGNOSIS — E669 Obesity, unspecified: Secondary | ICD-10-CM | POA: Diagnosis present

## 2020-10-07 DIAGNOSIS — I472 Ventricular tachycardia: Secondary | ICD-10-CM | POA: Diagnosis not present

## 2020-10-07 DIAGNOSIS — R0602 Shortness of breath: Secondary | ICD-10-CM

## 2020-10-07 DIAGNOSIS — I498 Other specified cardiac arrhythmias: Secondary | ICD-10-CM | POA: Diagnosis not present

## 2020-10-07 DIAGNOSIS — Z79899 Other long term (current) drug therapy: Secondary | ICD-10-CM

## 2020-10-07 DIAGNOSIS — I252 Old myocardial infarction: Secondary | ICD-10-CM

## 2020-10-07 DIAGNOSIS — N179 Acute kidney failure, unspecified: Secondary | ICD-10-CM | POA: Diagnosis not present

## 2020-10-07 DIAGNOSIS — E785 Hyperlipidemia, unspecified: Secondary | ICD-10-CM | POA: Diagnosis present

## 2020-10-07 DIAGNOSIS — R1011 Right upper quadrant pain: Secondary | ICD-10-CM

## 2020-10-07 DIAGNOSIS — Z7984 Long term (current) use of oral hypoglycemic drugs: Secondary | ICD-10-CM

## 2020-10-07 DIAGNOSIS — R7881 Bacteremia: Secondary | ICD-10-CM | POA: Diagnosis not present

## 2020-10-07 DIAGNOSIS — Y848 Other medical procedures as the cause of abnormal reaction of the patient, or of later complication, without mention of misadventure at the time of the procedure: Secondary | ICD-10-CM | POA: Diagnosis not present

## 2020-10-07 DIAGNOSIS — A419 Sepsis, unspecified organism: Secondary | ICD-10-CM

## 2020-10-07 DIAGNOSIS — I82612 Acute embolism and thrombosis of superficial veins of left upper extremity: Secondary | ICD-10-CM | POA: Diagnosis not present

## 2020-10-07 DIAGNOSIS — E119 Type 2 diabetes mellitus without complications: Secondary | ICD-10-CM

## 2020-10-07 DIAGNOSIS — R509 Fever, unspecified: Secondary | ICD-10-CM

## 2020-10-07 DIAGNOSIS — I808 Phlebitis and thrombophlebitis of other sites: Secondary | ICD-10-CM | POA: Diagnosis not present

## 2020-10-07 DIAGNOSIS — I4729 Other ventricular tachycardia: Secondary | ICD-10-CM

## 2020-10-07 DIAGNOSIS — I10 Essential (primary) hypertension: Secondary | ICD-10-CM | POA: Diagnosis present

## 2020-10-07 DIAGNOSIS — I110 Hypertensive heart disease with heart failure: Secondary | ICD-10-CM | POA: Diagnosis present

## 2020-10-07 DIAGNOSIS — R55 Syncope and collapse: Secondary | ICD-10-CM

## 2020-10-07 DIAGNOSIS — Z8249 Family history of ischemic heart disease and other diseases of the circulatory system: Secondary | ICD-10-CM

## 2020-10-07 DIAGNOSIS — B950 Streptococcus, group A, as the cause of diseases classified elsewhere: Secondary | ICD-10-CM | POA: Diagnosis not present

## 2020-10-07 DIAGNOSIS — I809 Phlebitis and thrombophlebitis of unspecified site: Secondary | ICD-10-CM

## 2020-10-07 DIAGNOSIS — I2510 Atherosclerotic heart disease of native coronary artery without angina pectoris: Secondary | ICD-10-CM | POA: Diagnosis present

## 2020-10-07 DIAGNOSIS — R0789 Other chest pain: Secondary | ICD-10-CM | POA: Diagnosis present

## 2020-10-07 DIAGNOSIS — Z6839 Body mass index (BMI) 39.0-39.9, adult: Secondary | ICD-10-CM

## 2020-10-07 DIAGNOSIS — T8172XA Complication of vein following a procedure, not elsewhere classified, initial encounter: Secondary | ICD-10-CM | POA: Diagnosis not present

## 2020-10-07 DIAGNOSIS — I5032 Chronic diastolic (congestive) heart failure: Secondary | ICD-10-CM | POA: Diagnosis present

## 2020-10-07 DIAGNOSIS — Z888 Allergy status to other drugs, medicaments and biological substances status: Secondary | ICD-10-CM

## 2020-10-07 DIAGNOSIS — I428 Other cardiomyopathies: Secondary | ICD-10-CM | POA: Diagnosis present

## 2020-10-07 LAB — CBC WITH DIFFERENTIAL/PLATELET
Abs Immature Granulocytes: 0.06 10*3/uL (ref 0.00–0.07)
Basophils Absolute: 0.1 10*3/uL (ref 0.0–0.1)
Basophils Relative: 1 %
Eosinophils Absolute: 0.1 10*3/uL (ref 0.0–0.5)
Eosinophils Relative: 1 %
HCT: 42.5 % (ref 39.0–52.0)
Hemoglobin: 13.8 g/dL (ref 13.0–17.0)
Immature Granulocytes: 1 %
Lymphocytes Relative: 22 %
Lymphs Abs: 2 10*3/uL (ref 0.7–4.0)
MCH: 29.7 pg (ref 26.0–34.0)
MCHC: 32.5 g/dL (ref 30.0–36.0)
MCV: 91.4 fL (ref 80.0–100.0)
Monocytes Absolute: 0.7 10*3/uL (ref 0.1–1.0)
Monocytes Relative: 7 %
Neutro Abs: 6.3 10*3/uL (ref 1.7–7.7)
Neutrophils Relative %: 68 %
Platelets: 211 10*3/uL (ref 150–400)
RBC: 4.65 MIL/uL (ref 4.22–5.81)
RDW: 13.5 % (ref 11.5–15.5)
WBC: 9.1 10*3/uL (ref 4.0–10.5)
nRBC: 0 % (ref 0.0–0.2)

## 2020-10-07 LAB — COMPREHENSIVE METABOLIC PANEL
ALT: 10 U/L (ref 0–44)
AST: 16 U/L (ref 15–41)
Albumin: 3.6 g/dL (ref 3.5–5.0)
Alkaline Phosphatase: 87 U/L (ref 38–126)
Anion gap: 7 (ref 5–15)
BUN: 30 mg/dL — ABNORMAL HIGH (ref 8–23)
CO2: 22 mmol/L (ref 22–32)
Calcium: 8.9 mg/dL (ref 8.9–10.3)
Chloride: 108 mmol/L (ref 98–111)
Creatinine, Ser: 1.73 mg/dL — ABNORMAL HIGH (ref 0.61–1.24)
GFR, Estimated: 40 mL/min — ABNORMAL LOW (ref >60)
Glucose, Bld: 115 mg/dL — ABNORMAL HIGH (ref 70–99)
Potassium: 4.6 mmol/L (ref 3.5–5.1)
Sodium: 137 mmol/L (ref 135–145)
Total Bilirubin: 0.7 mg/dL (ref 0.3–1.2)
Total Protein: 6.9 g/dL (ref 6.5–8.1)

## 2020-10-07 LAB — LIPASE, BLOOD: Lipase: 33 U/L (ref 11–51)

## 2020-10-07 LAB — RESP PANEL BY RT-PCR (FLU A&B, COVID) ARPGX2
Influenza A by PCR: NEGATIVE
Influenza B by PCR: NEGATIVE
SARS Coronavirus 2 by RT PCR: NEGATIVE

## 2020-10-07 LAB — TROPONIN I (HIGH SENSITIVITY)
Troponin I (High Sensitivity): 9 ng/L (ref <18)
Troponin I (High Sensitivity): 12 ng/L (ref <18)

## 2020-10-07 LAB — CBG MONITORING, ED
Glucose-Capillary: 106 mg/dL — ABNORMAL HIGH (ref 70–99)
Glucose-Capillary: 68 mg/dL — ABNORMAL LOW (ref 70–99)

## 2020-10-07 LAB — MAGNESIUM: Magnesium: 2 mg/dL (ref 1.7–2.4)

## 2020-10-07 LAB — BRAIN NATRIURETIC PEPTIDE: B Natriuretic Peptide: 184.5 pg/mL — ABNORMAL HIGH (ref 0.0–100.0)

## 2020-10-07 MED ORDER — ONDANSETRON HCL 4 MG/2ML IJ SOLN
4.0000 mg | Freq: Four times a day (QID) | INTRAMUSCULAR | Status: DC | PRN
  Administered 2020-10-08: 4 mg via INTRAVENOUS
  Filled 2020-10-07: qty 2

## 2020-10-07 MED ORDER — INSULIN ASPART 100 UNIT/ML IJ SOLN: 0.0000 [IU] | Freq: Every day | INTRAMUSCULAR | Status: DC

## 2020-10-07 MED ORDER — INSULIN ASPART 100 UNIT/ML IJ SOLN
0.0000 [IU] | Freq: Three times a day (TID) | INTRAMUSCULAR | Status: DC
  Administered 2020-10-08 (×2): 2 [IU] via SUBCUTANEOUS
  Administered 2020-10-09: 3 [IU] via SUBCUTANEOUS
  Administered 2020-10-10 – 2020-10-11 (×2): 2 [IU] via SUBCUTANEOUS
  Administered 2020-10-12 – 2020-10-13 (×2): 3 [IU] via SUBCUTANEOUS
  Administered 2020-10-13 (×2): 2 [IU] via SUBCUTANEOUS
  Administered 2020-10-14 (×2): 3 [IU] via SUBCUTANEOUS

## 2020-10-07 MED ORDER — PANTOPRAZOLE SODIUM 40 MG PO TBEC
40.0000 mg | DELAYED_RELEASE_TABLET | Freq: Every morning | ORAL | Status: DC
  Administered 2020-10-08 – 2020-10-14 (×7): 40 mg via ORAL
  Filled 2020-10-07 (×7): qty 1

## 2020-10-07 MED ORDER — ROSUVASTATIN CALCIUM 20 MG PO TABS: 20.0000 mg | ORAL_TABLET | Freq: Every morning | ORAL | Status: DC

## 2020-10-07 MED ORDER — VITAMIN B-12 1000 MCG PO TABS
1000.0000 ug | ORAL_TABLET | Freq: Every morning | ORAL | Status: DC
  Administered 2020-10-08 – 2020-10-14 (×7): 1000 ug via ORAL
  Filled 2020-10-07 (×6): qty 1

## 2020-10-07 MED ORDER — ACETAMINOPHEN 325 MG PO TABS: 650.0000 mg | ORAL_TABLET | Freq: Four times a day (QID) | ORAL | Status: DC | PRN

## 2020-10-07 MED ORDER — MONTELUKAST SODIUM 10 MG PO TABS
10.0000 mg | ORAL_TABLET | Freq: Every morning | ORAL | Status: DC
  Administered 2020-10-08 – 2020-10-14 (×7): 10 mg via ORAL
  Filled 2020-10-07 (×7): qty 1

## 2020-10-07 MED ORDER — ROSUVASTATIN CALCIUM 20 MG PO TABS
40.0000 mg | ORAL_TABLET | Freq: Every morning | ORAL | Status: DC
  Administered 2020-10-08 – 2020-10-14 (×7): 40 mg via ORAL
  Filled 2020-10-07 (×7): qty 2

## 2020-10-07 MED ORDER — HEPARIN SODIUM (PORCINE) 5000 UNIT/ML IJ SOLN
5000.0000 [IU] | Freq: Three times a day (TID) | INTRAMUSCULAR | Status: DC
  Administered 2020-10-07 – 2020-10-09 (×7): 5000 [IU] via SUBCUTANEOUS
  Filled 2020-10-07 (×7): qty 1

## 2020-10-07 MED ORDER — ONDANSETRON HCL 4 MG PO TABS: 4.0000 mg | ORAL_TABLET | Freq: Four times a day (QID) | ORAL | Status: DC | PRN

## 2020-10-07 MED ORDER — ACETAMINOPHEN 650 MG RE SUPP: 650.0000 mg | Freq: Four times a day (QID) | RECTAL | Status: DC | PRN

## 2020-10-07 MED ORDER — CARVEDILOL 12.5 MG PO TABS
12.5000 mg | ORAL_TABLET | Freq: Two times a day (BID) | ORAL | Status: DC
  Administered 2020-10-08 (×2): 12.5 mg via ORAL
  Filled 2020-10-07 (×2): qty 1

## 2020-10-07 MED ORDER — LACTATED RINGERS IV BOLUS
1000.0000 mL | Freq: Once | INTRAVENOUS | Status: AC
  Administered 2020-10-07: 1000 mL via INTRAVENOUS

## 2020-10-07 MED ORDER — BUPROPION HCL ER (XL) 150 MG PO TB24
300.0000 mg | ORAL_TABLET | Freq: Every morning | ORAL | Status: DC
  Administered 2020-10-08 – 2020-10-14 (×7): 300 mg via ORAL
  Filled 2020-10-07: qty 2
  Filled 2020-10-07: qty 1
  Filled 2020-10-07 (×5): qty 2

## 2020-10-07 NOTE — ED Notes (Signed)
Patient transported to X-ray 

## 2020-10-07 NOTE — H&P (Addendum)
History and Physical    Karl Peters ZOX:[id number] DOB: 1940-04-10 DOA: 10/07/2020  PCP: Elizabeth Palau, FNP  Patient coming from: Home  I have personally briefly reviewed patient's old medical records in [facility]  Chief Complaint: Weakness, nausea  HPI: Karl Peters is a 80 y.o. male with medical history significant of DM2, HTN, HLD, prior NSVT and symptomatic PVCs treated with BB.  Pt presents to ED for generalized weakness.  Pt started on Jardiance ~2 weeks ago, made him feel bad so only took it for one day and hasnt taken it since.  2 weeks of nausea.  1 week of generalized weakness.  Symptoms constant, worsening, now severe:  This morning he was working in his garden when he became very tired and had to sit down and he became pale.  He became short of breath but denies any chest pain.   He states that he feels at his baseline when he is lying down but not when he ambulates he feels weak.  He denies any numbness or paresthesia.  No fevers, chills, headache, CP.  Does have ~1h history of RUQ abd pain that he hadnt noticed before today.  Gallbladder still in place, daughter has had gallbladder out.   ED Course: AKI with creat 1.7 up from 1.1 earlier this month.  Trops 12 and 9  BP running on soft side initially: 99/53.  HR: HR by EKG is 110, HR by O2 pleth is 32.  Pt has what looks like a very abnormal bigemy(ish) rhythm: with One Sinus beat, one pvc, one sinus beat, two PVC pair (rhythm repeats).  1L LR bolus, and cards consulted.   Review of Systems: As per HPI, otherwise all review of systems negative.  Past Medical History:  Diagnosis Date   Diabetes mellitus    Dyslipidemia    Hx of cardiac catheterization    a. normal cors by cath in 1998 and 2010   Hx of echocardiogram    Echocardiogram 06/10/12: Normal LV wall thickness, EF 50-55%, grade 1 diastolic dysfunction, mild LAE   Hypertension    Obesity    Ventricular tachycardia (HCC)    and  symptomatic PVCs - Rx with beta blocker    Past Surgical History:  Procedure Laterality Date   ESOPHAGOGASTRODUODENOSCOPY  06/18/2003   negative   LEFT HEART CATH  12/27/2015   Procedure: Left Heart Catheterization W Intervention; Surgeon: Arizona Constable, MD; Location: [facility] C[facility]; Service: Cardiology       reports that he has never smoked. He has never used smokeless tobacco. He reports that he does not drink alcohol. No history on file for drug use.  Allergies  Allergen Reactions   Jardiance [Empagliflozin] Nausea Only    Family History  Problem Relation Age of Onset   Heart attack Father 26       Died at age 42 from heart attack   Heart failure Mother 39       Died from heart failure   Lymphoma Mother    Diabetes Mother    Diabetes Mellitus II Maternal Grandmother    Other Maternal Grandfather        head injury   Prostate cancer Paternal Grandfather    Depression Daughter    Rheum arthritis Daughter    Anxiety disorder Daughter      Prior to Admission medications   Medication Sig Start Date End Date Taking? Authorizing Provider  amLODipine (NORVASC) 10 MG tablet Take 10 mg by mouth every  morning. 07/20/17  Yes [provider]  buPROPion (WELLBUTRIN XL) 300 MG 24 hr tablet Take 300 mg by mouth every morning.   Yes [provider]  carvedilol (COREG) 12.5 MG tablet Take 1 tablet (12.5 mg total) by mouth 2 (two) times daily with a meal. Patient taking differently: Take 12.5 mg by mouth every morning. 06/21/13  Yes Allred, Fayrene Fearing, MD  glimepiride (AMARYL) 4 MG tablet Take 4 mg by mouth every morning. 11/22/17  Yes [provider]  metFORMIN (GLUCOPHAGE) 500 MG tablet Take 1,000 mg by mouth every morning.   Yes [provider]  montelukast (SINGULAIR) 10 MG tablet Take 10 mg by mouth every morning. 05/16/17  Yes [provider]  pantoprazole (PROTONIX) 40 MG tablet Take 40 mg by mouth every morning.   Yes [provider]  rosuvastatin (CRESTOR) 20 MG tablet Take 20 mg by mouth every morning.   Yes [provider]  valsartan (DIOVAN) 320 MG tablet Take 320 mg by mouth every morning.   Yes [provider]  vitamin B-12 (CYANOCOBALAMIN) 1000 MCG tablet Take 1,000 mcg by mouth every morning.   Yes [provider]    Physical Exam: Vitals:   10/07/20 1745 10/07/20 1800 10/07/20 1900 10/07/20 1930  BP: 103/64 116/73 (!) 122/54 132/66  Pulse: 79 65 86 (!) 32  Resp: 16 14 15 13   Temp:      TempSrc:      SpO2: 97% 99% 91% 99%    Constitutional: NAD, calm, comfortable Eyes: PERRL, lids and conjunctivae normal ENMT: Mucous membranes are moist. Posterior pharynx clear of any exudate or lesions.Normal dentition.  Neck: normal, supple, no masses, no thyromegaly Respiratory: clear to auscultation bilaterally, no wheezing, no crackles. Normal respiratory effort. No accessory muscle use.  Cardiovascular: Regularly irregular Abdomen: no tenderness, no masses palpated. No hepatosplenomegaly. Bowel sounds positive.  Musculoskeletal: no clubbing / cyanosis. No joint deformity upper and lower extremities. Good ROM, no contractures. Normal muscle tone.  Skin: no rashes, lesions, ulcers. No induration Neurologic: CN 2-12 grossly intact. Sensation intact, DTR normal. Strength 5/5 in all 4.  Psychiatric: Normal judgment and insight. Alert and oriented x 3. Normal mood.    Labs on Admission: I have personally reviewed following labs and imaging studies  CBC: Recent Labs  Lab 10/07/20 1641  WBC 9.1  NEUTROABS 6.3  HGB 13.8  HCT 42.5  MCV 91.4  PLT 211   Basic Metabolic Panel: Recent Labs  Lab 10/07/20 1641  NA 137  K 4.6  CL 108  CO2 22  GLUCOSE 115*  BUN 30*  CREATININE 1.73*  CALCIUM 8.9  MG 2.0   GFR: CrCl cannot be calculated (Unknown ideal weight.). Liver Function Tests: Recent Labs  Lab 10/07/20 1641  AST 16  ALT 10  ALKPHOS 87  BILITOT 0.7  PROT 6.9   ALBUMIN 3.6   Recent Labs  Lab 10/07/20 1641  LIPASE 33   No results for input(s): AMMONIA in the last 168 hours. Coagulation Profile: No results for input(s): INR, PROTIME in the last 168 hours. Cardiac Enzymes: No results for input(s): CKTOTAL, CKMB, CKMBINDEX, TROPONINI in the last 168 hours. BNP (last 3 results) No results for input(s): PROBNP in the last 8760 hours. HbA1C: No results for input(s): HGBA1C in the last 72 hours. CBG: Recent Labs  Lab 10/07/20 1633  GLUCAP 106*   Lipid Profile: No results for input(s): CHOL, HDL, LDLCALC, TRIG, CHOLHDL, LDLDIRECT in the last 72 hours. Thyroid Function  Tests: No results for input(s): TSH, T4TOTAL, FREET4, T3FREE, THYROIDAB in the last 72 hours. Anemia Panel: No results for input(s): VITAMINB12, FOLATE, FERRITIN, TIBC, IRON, RETICCTPCT in the last 72 hours. Urine analysis:    Component Value Date/Time   COLORURINE YELLOW 06/09/2012 1641   APPEARANCEUR CLEAR 06/09/2012 1641   LABSPEC 1.020 06/09/2012 1641   PHURINE 5.0 06/09/2012 1641   GLUCOSEU NEGATIVE 06/09/2012 1641   HGBUR NEGATIVE 06/09/2012 1641   BILIRUBINUR NEGATIVE 06/09/2012 1641   KETONESUR NEGATIVE 06/09/2012 1641   PROTEINUR NEGATIVE 06/09/2012 1641   UROBILINOGEN 0.2 06/09/2012 1641   NITRITE NEGATIVE 06/09/2012 1641   LEUKOCYTESUR NEGATIVE 06/09/2012 1641    Radiological Exams on Admission: DG Chest 1 View  Result Date: 10/07/2020 CLINICAL DATA:  Near syncope, weakness EXAM: CHEST  1 VIEW COMPARISON:  12/26/2015 FINDINGS: Stable cardiomediastinal contours. No focal airspace consolidation, pleural effusion, or pneumothorax. Degenerative changes of the shoulders. IMPRESSION: No active disease. Electronically Signed   By: Duanne Guess D.O.   On: 10/07/2020 15:45    EKG: Independently reviewed.  Assessment/Plan Principal Problem:   AKI (acute kidney injury) (HCC) Active Problems:   DM2 (diabetes mellitus, type 2) (HCC)   Essential  hypertension   Hypotension   Arrhythmia    AKI - Likely pre-renal 1L IVF in ED Strict intake and output Daily BMP Hold ACEi (and other BP meds) ? If perfusion issue due to abnormal EKG? See below Arrythmia - Cards consulted Increase Coreg to BID dosing (was taking it only once daily at home) 2d echo Check TSH Tele monitor RUQ abd pain - LFTs nl RUQ [place] to r/o cholecystitis If neg then advance diet. HTN - Holding all BP meds other than coreg in setting of AKI, soft BPs initially. Holding valsartan and Norvasc DM2 - Hold home PO hypoglycemics Mod scale SSI AC/HS HLD - Increase crestor to 40 per cards  DVT prophylaxis: Heparin [place] Code Status: Full Family Communication: Wife at bedside Disposition Plan: Home after admit Consults called: Cardiology Admission status: Place in 91     Karl Peters M. DO [facility]  How to contact the [facility] Attending or Consulting provider 7A - 7P or covering provider during after hours 7P -7A, for this patient?  Check the care team in [facility] and look for a) attending/consulting [facility] provider listed and b) the [facility] team listed Log into [email or web address]  Amion Physician Scheduling and messaging for groups and whole hospitals  On call and physician scheduling software for group practices, residents, hospitalists and other medical providers for call, clinic, rotation and shift schedules. OnCall Enterprise is a hospital-wide system for scheduling doctors and paging doctors on call. EasyPlot is for scientific plotting and data analysis.  [email or web address]  and use [place]'s universal password to access. If you do not have the password, please contact the hospital operator.  Locate the [facility] provider you are looking for under [facility] and page to a number that you can be directly reached. If you still have difficulty reaching the provider, please page the [facility] (Director on Call) for the Hospitalists listed on amion for  assistance.  10/07/2020, 8:05 PM

## 2020-10-07 NOTE — ED Provider Notes (Signed)
[facility] EMERGENCY DEPARTMENT Provider Note   CSN: [id number] Arrival date & time: 10/07/20  1443     History Chief Complaint  Patient presents with   Weakness   Nausea    Karl Peters is a 80 y.o. male.  Patient is a 80 year old male with a past medical history of type 2 diabetes, HLD that is presenting for weakness.  Patient states that he has had generalized weakness for the last week and has been experiencing nausea for 2 weeks.  This morning he was working in his garden when he became very tired and had to sit down and he became pale.  He became short of breath but denies any chest pain.  He arrived on 2 L nasal cannula.  This is a new oxygen requirement.  He denies any chest pain at this time.  He states that he feels at his baseline when he is lying down but not when he ambulates he feels weak.  He denies any numbness or paresthesia.   Patient states that he recently was started on Jardiance for diabetes but it made him feel bad so he has not been taking it.  Patient initially presented to an urgent care.  He was hypotensive and bradycardic so he was transferred to our ED.  Patient denies any recent fevers, chills, headache, neck stiffness, chest pain, vomiting, diarrhea, numbness or weakness.   Weakness Associated symptoms: nausea   Associated symptoms: no abdominal pain, no arthralgias, no chest pain, no cough, no diarrhea, no dizziness, no dysuria, no fever, no frequency, no headaches, no myalgias, no seizures, no shortness of breath, no urgency and no vomiting       Past Medical History:  Diagnosis Date   Diabetes mellitus    Dyslipidemia    Hx of cardiac catheterization    a. 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   VASOVAGAL SYNCOPE 05/24/2008    Past Surgical History:  Procedure Laterality Date   ESOPHAGOGASTRODUODENOSCOPY  06/18/2003   negative   LEFT HEART CATH  12/27/2015   Procedure: Left Heart Catheterization W Intervention; Surgeon: Arizona Constable, MD; Location: [facility] CATH; Service: Cardiology         Family History  Problem Relation Age of Onset   Heart attack Father 50       Died at age 75 from heart attack   Heart failure Mother 64       Died from heart failure   Lymphoma Mother    Diabetes Mother    Diabetes Mellitus II Maternal Grandmother    Other Maternal Grandfather        head injury   Prostate cancer Paternal Grandfather    Depression Daughter    Rheum arthritis Daughter    Anxiety disorder Daughter     Social History   Tobacco Use   Smoking status: Never   Smokeless tobacco: Never  Substance Use Topics   Alcohol use: No  Home Medications Prior to Admission medications   Medication Sig Start Date End Date Taking? Authorizing Provider  amLODipine (NORVASC) 10 MG tablet Take by mouth. 07/20/17   [provider]  aspirin 325 MG tablet Take 325 mg by mouth daily.    [provider]  baclofen (LIORESAL) 10 MG tablet Take 0.5-1 tablets (5-10 mg total) by mouth 3 (three) times daily as needed for muscle spasms. 11/20/19   Hilts, Casimiro Needle, MD  buPROPion (WELLBUTRIN XL) 300 MG 24 hr tablet Take 300 mg by mouth daily.    [provider]  carvedilol (COREG) 12.5 MG tablet Take 1 tablet (12.5 mg total) by mouth 2 (two) times daily with a meal. 06/21/13   Allred, Fayrene Fearing, MD  etodolac (LODINE) 400 MG tablet Take 1 tablet (400 mg total) by mouth 2 (two) times daily as  needed. 02/04/18   Hilts, Casimiro Needle, MD  furosemide (LASIX) 20 MG tablet Take 20 mg by mouth daily.     [provider]  glimepiride (AMARYL) 4 MG tablet Take by mouth. 11/22/17 11/22/18  [provider]  metFORMIN (GLUCOPHAGE) 1000 MG tablet Take 1,000 mg by mouth daily with breakfast.    [provider]  methylPREDNISolone (MEDROL DOSEPAK) 4 MG TBPK tablet As directed for 6 days. 11/20/19   Hilts, Casimiro Needle, MD  montelukast (SINGULAIR) 10 MG tablet TAKE 1 TABLET BY MOUTH EVERY DAY 05/16/17   [provider]  pantoprazole (PROTONIX) 40 MG tablet Take 40 mg by mouth daily.    [provider]  rosuvastatin (CRESTOR) 20 MG tablet Take 20 mg by mouth daily.    [provider]  traMADol (ULTRAM) 50 MG tablet Take 1 tablet (50 mg total) by mouth every 6 (six) hours as needed. 06/03/20   Hilts, Casimiro Needle, MD    Allergies    Patient has no known allergies.  Review of Systems   Review of Systems  Constitutional:  Negative for chills, diaphoresis, fatigue and fever.  HENT:  Negative for congestion, dental problem, ear pain, facial swelling, hearing loss, nosebleeds, postnasal drip, rhinorrhea, sore throat and trouble swallowing.   Eyes:  Negative for photophobia, pain and visual disturbance.  Respiratory:  Negative for apnea, cough, choking, chest tightness, shortness of breath, wheezing and stridor.   Cardiovascular:  Negative for chest pain, palpitations and leg swelling.  Gastrointestinal:  Positive for nausea. Negative for abdominal distention, abdominal pain, constipation, diarrhea and vomiting.  Endocrine: Negative for polydipsia and polyuria.  Genitourinary:  Negative for difficulty urinating, dysuria, flank pain, frequency, hematuria and urgency.  Musculoskeletal:  Negative for arthralgias, back pain, gait problem, myalgias, neck pain and neck stiffness.  Skin:  Negative for color change, rash and wound.  Allergic/Immunologic: Negative for  environmental allergies and food allergies.  Neurological:  Positive for weakness and light-headedness. Negative for dizziness, tremors, seizures, syncope, facial asymmetry, speech difficulty, numbness and headaches.  Psychiatric/Behavioral:  Negative for behavioral problems and confusion.   All other systems reviewed and are negative.  Physical Exam Updated Vital Signs There were no vitals taken for this visit.  Physical Exam Vitals and nursing note reviewed.  Constitutional:      General: He is not in acute distress.    Appearance: Normal appearance. He is normal weight.  HENT:     Head: Normocephalic and atraumatic.     Right Ear: External ear normal.     Left Ear: External ear normal.     Nose: Nose normal. No congestion.     Mouth/Throat:  Mouth: Mucous membranes are moist.     Pharynx: Oropharynx is clear. No oropharyngeal exudate or posterior oropharyngeal erythema.  Eyes:     General: No visual field deficit.    Extraocular Movements: Extraocular movements intact.     Conjunctiva/sclera: Conjunctivae normal.     Pupils: Pupils are equal, round, and reactive to light.  Cardiovascular:     Rate and Rhythm: Normal rate and regular rhythm.     Pulses: Normal pulses.     Heart sounds: Normal heart sounds. No murmur heard.   No friction rub. No gallop.  Pulmonary:     Effort: Pulmonary effort is normal. No respiratory distress.     Breath sounds: Normal breath sounds. No stridor. No wheezing, rhonchi or rales.  Chest:     Chest wall: No tenderness.  Abdominal:     General: Abdomen is flat. Bowel sounds are normal. There is no distension.     Palpations: Abdomen is soft.     Tenderness: There is no abdominal tenderness. There is no right CVA tenderness, left CVA tenderness, guarding or rebound.  Musculoskeletal:        General: No swelling or tenderness. Normal range of motion.     Cervical back: Normal range of motion and neck supple. No rigidity, tenderness or bony  tenderness.     Thoracic back: Normal. No tenderness or bony tenderness.     Lumbar back: Normal. No tenderness or bony tenderness.     Right lower leg: No edema.     Left lower leg: No edema.  Skin:    General: Skin is warm and dry.  Neurological:     General: No focal deficit present.     Mental Status: He is alert and oriented to person, place, and time. Mental status is at baseline.     Cranial Nerves: Cranial nerves are intact. No cranial nerve deficit, dysarthria or facial asymmetry.     Sensory: Sensation is intact. No sensory deficit.     Motor: Motor function is intact. No weakness.     Coordination: Coordination is intact. Finger-Nose-Finger Test normal.     Gait: Gait is intact. Gait normal.  Psychiatric:        Mood and Affect: Mood normal.        Behavior: Behavior normal.        Thought Content: Thought content normal.        Judgment: Judgment normal.    ED Results / Procedures / Treatments   Labs (all labs ordered are listed, but only abnormal results are displayed) Labs Reviewed  CBG MONITORING, ED    EKG None  Radiology No results found.  Procedures Procedures   Medications Ordered in ED Medications - No data to display  ED Course  I have reviewed the triage vital signs and the nursing notes.  Pertinent labs & imaging results that were available during my care of the patient were reviewed by me and considered in my medical decision making (see chart for details).    MDM Rules/Calculators/A&P                         TIMMOTHY BARANOWSKI is a 80 y.o. male with a past medical history of type 2 diabetes, HLD that is presenting for weakness.  On arrival patient is 90% on 2 L of oxygen.  This is a new oxygen requirement.  He was initially seen in urgent care when he was transferred to our  ED due to hypotension.  He received 100 cc of IV fluids in route.  Patient is hemodynamically stable and in no acute distress.  Patient endorses 2 weeks of nausea and weakness.   He had new onset fatigue while working in his garden today.  Patient currently denies any chest pain or shortness of breath.  He has improvement of symptoms when he is lying down. Patient has a history of ventricular tachycardia.  Patient's last echo showed an EF of 50 to 55%.  Patient has no signs of fluid overload.  No JVD, crackles, lower extremity edema.  Patient's chest x-ray showed no acute cardiopulmonary abnormality. He is no longer on oxygen.   His CBC, lipase, magnesium was unremarkable. His CMP is notable for an AKI. He was given IV fluids. His BNP was slightly elevated. Troponin x2 were negative.   Cardiology was consulted due to ventricular couplets and PVCs.   Patient was admitted to hospitalist for further evaluation.  Patient states compliance and understanding of the plan. I explained labs and imaging to the patient. No further questions at this time from the patient.  The plan for this patient was discussed with Dr. Jeraldine Loots, who voiced agreement and who oversaw evaluation and treatment of this patient.   Final Clinical Impression(s) / ED Diagnoses Final diagnoses:  Syncope, unspecified syncope type    Rx / DC Orders ED Discharge Orders     None        Lottie Dawson, MD 10/08/20 1004    Gerhard Munch, MD 10/10/20 0005

## 2020-10-07 NOTE — ED Triage Notes (Signed)
Pt comes from [facility] in [place] via EMS. Pt presented to urgent care via POV (wife) with nausea and generalized weakness 1 week. EMS called due to pt feeling weak, dizzy, and nauseated. EMS was told pt was hypotensive (100/42)and bradycardic (33). Pt's bp with EMS 108 palpated and HR 70-80's. Pt a/ox4. BGL 165 BP 108 palp O2 97% RA- (98% on 2L ) 18g in LAC 100 ml NS given Hx ventricular tachycardia, MI with stents. Has not followed up with cardiology recently. Pt started new diabetes medication about 1 week ago and started feeling bad with mediation so stopped taking it. Pt reports he has lost 10lb in 1 week.

## 2020-10-07 NOTE — Consult Note (Signed)
Cardiology Consultation:   Patient ID: Karl Peters MRN: [id number]; DOB: 1940-05-11  Admit date: 10/07/2020 Date of Consult: 10/07/2020  PCP:  Elizabeth Palau, FNP   [facility] Providers Cardiologist:  None        Patient Profile:   Karl Peters is a 80 y.o. male with a hx of diabetes, hypertension, hyperlipidemia who is being seen 10/07/2020 for the evaluation of PVCs at the request of Karl Peters.  History of Present Illness:   Karl Peters presented to the hospital with weakness and nausea for 2 weeks.  He reported having recently started on Jardiance and not feeling well so he discontinued it.  He he initially presented to an urgent careWords blood pressure per EMS was reportedly 100/42 and he was bradycardic to 33.  In the ED EKG revealed Sinus rhythm with ventricular couplets and frequent PVCs.  He had a baseline right bundle branch block.  Cardiac enzymes were within normal limits x2.  BNP was mildly elevated to 184.  Cardiology was consulted for PVCs.  He had an AKI with increasing creatinine from 1.14 earlier this month to 1.7.  Electrolytes were unremarkable.  He has a longstanding history of NSVT and PVCs.  He had cardiac catheterizations in 1998 and 2010 for this issue and normal coronary arteries at that time.  His last echo in 2014 revealed LVEF 50 to 55% with grade 1 diastolic dysfunction and was otherwise unremarkable.   He last saw Karl Peters 10/2012 and was asymptomatic on carvedilol. PVCs were well-controlled at that time.  He had an abnormal nuclear stress test at [facility] in 2017 that was concerning for basal inferior and inferoapical ischemia.  He underwent left heart catheterization and had very mild, non-obstructive CAD.  He has felt well until last week when he took Jardiance and developed nausea without emesis.  At baseline he cuts his lawn with a push mower and has no exertional chest pain.  He has stable exertional dyspnea that is unchanged from prior.  He denies lower  extremity edema, orthopnea, or PND.  He notes that he has been taking his carvedilol only once per day.  His oral intake has been poor but he denies emesis.  Past Medical History:  Diagnosis Date   Diabetes mellitus    Dyslipidemia    Hx of cardiac catheterization    a. normal cors by cath in 1998 and 2010   Hx of echocardiogram    Echocardiogram 06/10/12: Normal LV wall thickness, EF 50-55%, grade 1 diastolic dysfunction, mild LAE   Hypertension    Obesity    Ventricular tachycardia (HCC)    and symptomatic PVCs - Rx with beta blocker    Past Surgical History:  Procedure Laterality Date   ESOPHAGOGASTRODUODENOSCOPY  06/18/2003   negative   LEFT HEART CATH  12/27/2015   Procedure: Left Heart Catheterization W Intervention; Surgeon: Arizona Constable, MD; Location: [facility] CATH; Service: Cardiology       Home Medications:  Prior to Admission medications   Medication Sig Start Date End Date Taking? Authorizing Provider  amLODipine (NORVASC) 10 MG tablet Take 10 mg by mouth every morning. 07/20/17  Yes [provider]  buPROPion (WELLBUTRIN XL) 300 MG 24 hr tablet Take 300 mg by mouth every morning.   Yes [provider]  carvedilol (COREG) 12.5 MG tablet Take 1 tablet (12.5 mg total) by mouth 2 (two) times daily with a meal. Patient taking differently: Take 12.5 mg by mouth every morning. 06/21/13  Yes Allred, Fayrene Fearing, MD  glimepiride (AMARYL) 4 MG tablet Take 4 mg by mouth every morning. 11/22/17  Yes [provider]  metFORMIN (GLUCOPHAGE) 500 MG tablet Take 1,000 mg by mouth every morning.   Yes [provider]  montelukast (SINGULAIR) 10 MG tablet Take 10 mg by mouth every morning. 05/16/17  Yes [provider]  pantoprazole (PROTONIX) 40 MG tablet Take 40 mg by mouth every morning.   Yes [provider]  rosuvastatin (CRESTOR) 20 MG tablet Take 20 mg by mouth every morning.   Yes [provider]  valsartan (DIOVAN) 320  MG tablet Take 320 mg by mouth every morning.   Yes [provider]  vitamin B-12 (CYANOCOBALAMIN) 1000 MCG tablet Take 1,000 mcg by mouth every morning.   Yes [provider]    Inpatient Medications: Scheduled Meds:  [START ON 10/08/2020] buPROPion  300 mg Oral q morning   heparin  5,000 Units Subcutaneous Q8H   [START ON 10/08/2020] insulin aspart  0-15 Units Subcutaneous TID WC   insulin aspart  0-5 Units Subcutaneous QHS   [START ON 10/08/2020] montelukast  10 mg Oral q morning   [START ON 10/08/2020] pantoprazole  40 mg Oral q morning   [START ON 10/08/2020] rosuvastatin  20 mg Oral q morning   [START ON 10/08/2020] vitamin B-12  1,000 mcg Oral q morning   Continuous Infusions:  PRN Meds: acetaminophen **OR** acetaminophen, ondansetron **OR** ondansetron (ZOFRAN) IV  Allergies:    Allergies  Allergen Reactions   Jardiance [Empagliflozin] Nausea Only    Social History:   Social History   Socioeconomic History   Marital status: Single    Spouse name: Not on file   Number of children: Not on file   Years of education: Not on file   Highest education level: Not on file  Occupational History   Not on file  Tobacco Use   Smoking status: Never   Smokeless tobacco: Never  Substance and Sexual Activity   Alcohol use: No   Drug use: Not on file   Sexual activity: Not on file  Other Topics Concern   Not on file  Social History Narrative   Not on file   Social Determinants of Health   Financial Resource Strain: Not on file  Food Insecurity: Not on file  Transportation Needs: Not on file  Physical Activity: Not on file  Stress: Not on file  Social Connections: Not on file  Intimate Partner Violence: Not on file    Family History:    Family History  Problem Relation Age of Onset   Heart attack Father 19       Died at age 89 from heart attack   Heart failure Mother 34       Died from heart failure   Lymphoma Mother    Diabetes Mother    Diabetes  Mellitus II Maternal Grandmother    Other Maternal Grandfather        head injury   Prostate cancer Paternal Grandfather    Depression Daughter    Rheum arthritis Daughter    Anxiety disorder Daughter      ROS:  Please see the history of present illness.   All other ROS reviewed and negative.     Physical Exam/Data:   VS:  BP 132/66   Pulse (!) 32   Temp 97.6 F (36.4 C) (Oral)   Resp 13   SpO2 99%  , BMI There is no height or weight  on file to calculate BMI. GENERAL:  Well appearing HEENT: Pupils equal round and reactive, fundi not visualized, oral mucosa unremarkable NECK:  No jugular venous distention, waveform within normal limits, carotid upstroke brisk and symmetric, no bruits LUNGS:  Clear to auscultation bilaterally HEART:  RRR.  PMI not displaced or sustained,S1 and S2 within normal limits, no S3, no S4, no clicks, no rubs, no murmurs ABD:  Flat, positive bowel sounds normal in frequency in pitch, no bruits, no rebound, no guarding, no midline pulsatile mass, no hepatomegaly, no splenomegaly EXT:  2 plus pulses throughout, no edema, no cyanosis no clubbing SKIN:  No rashes no nodules NEURO:  Cranial nerves II through XII grossly intact, motor grossly intact throughout PSYCH:  Cognitively intact, oriented to person place and time   EKG:  The EKG was personally reviewed and demonstrates:  Sinus rhythm.  Rate 78 bpm.  First-degree AV block.  Right bundle branch block.  Frequent PVCs and ventricular couplets.  LAFB.  (Trifascicular block) Telemetry:  Telemetry was personally reviewed and demonstrates: Sinus rhythm.  Frequent PVCs and ventricular couplets  Relevant CV Studies: Echo 06/10/2012: Study Conclusions   - Left ventricle: The cavity size was normal. Wall thickness    was normal. Systolic function was normal. The estimated    ejection fraction was in the range of 50% to 55%. Regional    wall motion abnormalities cannot be excluded. Doppler    parameters are  consistent with abnormal left ventricular    relaxation (grade 1 diastolic dysfunction).  - Left atrium: The atrium was mildly dilated.  Impressions:   - Technically difficult; definity used; LV function appears    to be preserved; focal wall motion abnormality cannot be    excluded; suggest MUGA or cardiac MRI if clinically    indicated.   Laboratory Data:  High Sensitivity Troponin:   Recent Labs  Lab 10/07/20 1641 10/07/20 1711  TROPONINIHS 12 9     Chemistry Recent Labs  Lab 10/07/20 1641  NA 137  K 4.6  CL 108  CO2 22  GLUCOSE 115*  BUN 30*  CREATININE 1.73*  CALCIUM 8.9  GFRNONAA 40*  ANIONGAP 7    Recent Labs  Lab 10/07/20 1641  PROT 6.9  ALBUMIN 3.6  AST 16  ALT 10  ALKPHOS 87  BILITOT 0.7   Hematology Recent Labs  Lab 10/07/20 1641  WBC 9.1  RBC 4.65  HGB 13.8  HCT 42.5  MCV 91.4  MCH 29.7  MCHC 32.5  RDW 13.5  PLT 211   BNP Recent Labs  Lab 10/07/20 1641  BNP 184.5*    DDimer No results for input(s): DDIMER in the last 168 hours.   Radiology/Studies:  DG Chest 1 View  Result Date: 10/07/2020 CLINICAL DATA:  Near syncope, weakness EXAM: CHEST  1 VIEW COMPARISON:  12/26/2015 FINDINGS: Stable cardiomediastinal contours. No focal airspace consolidation, pleural effusion, or pneumothorax. Degenerative changes of the shoulders. IMPRESSION: No active disease. Electronically Signed   By: Duanne Guess D.O.   On: 10/07/2020 15:45     Assessment and Plan:   # PVCs:  # NSVT:  Mr. Bogle has very frequent PVCs and ventricular couplets.  This is longstanding issue, though his EKGs when last seen in 2014 showed that it was much better controlled.  He does not have any electrolyte abnormalities at this time.  We will check his thyroid function.  He has had multiple ischemic evaluations that have been unremarkable and he has no  ischemic symptoms at this time.  We will plan to repeat his echocardiogram and not pursue another ischemic evaluation  unless there is a change in his systolic function.  He is only been taking his carvedilol once per day which may be contributing.  Oral intake has been poor which I suspect is contributing to his low blood pressure.  We will hold his other antihypertensives at this time.  No indication for ICD at this time.  # Elevated BNP: # AKI:  BNP is mildly elevated.  He appears euvolemic.  We will not diurese as he has acute renal failure and respiratory status is stable..  Echocardiogram as above.  Agree with gentle hydration.  # Hyperlipidemia:  Lipids are poorly controlled.  LDL was 132 on 09/2020.  Given that he has diabetes it should be at least less than 100.  We will increase his rosuvastatin to 40 mg.  Repeat lipids and a CMP in 2 to 3 months.   Risk Assessment/Risk Scores:       For questions or updates, please contact [facility] Please consult [email or web address] for contact info under    Signed, Chilton Si, MD  10/07/2020 7:59 PM

## 2020-10-08 ENCOUNTER — Observation Stay (HOSPITAL_BASED_OUTPATIENT_CLINIC_OR_DEPARTMENT_OTHER): Payer: Medicare Other

## 2020-10-08 ENCOUNTER — Other Ambulatory Visit: Payer: Self-pay

## 2020-10-08 DIAGNOSIS — I952 Hypotension due to drugs: Secondary | ICD-10-CM

## 2020-10-08 DIAGNOSIS — N179 Acute kidney failure, unspecified: Secondary | ICD-10-CM | POA: Diagnosis not present

## 2020-10-08 DIAGNOSIS — R9431 Abnormal electrocardiogram [ECG] [EKG]: Secondary | ICD-10-CM

## 2020-10-08 DIAGNOSIS — I472 Ventricular tachycardia: Secondary | ICD-10-CM | POA: Diagnosis not present

## 2020-10-08 DIAGNOSIS — I10 Essential (primary) hypertension: Secondary | ICD-10-CM | POA: Diagnosis not present

## 2020-10-08 LAB — BASIC METABOLIC PANEL
Anion gap: 6 (ref 5–15)
BUN: 27 mg/dL — ABNORMAL HIGH (ref 8–23)
CO2: 23 mmol/L (ref 22–32)
Calcium: 8.9 mg/dL (ref 8.9–10.3)
Chloride: 109 mmol/L (ref 98–111)
Creatinine, Ser: 1.59 mg/dL — ABNORMAL HIGH (ref 0.61–1.24)
GFR, Estimated: 44 mL/min — ABNORMAL LOW (ref >60)
Glucose, Bld: 158 mg/dL — ABNORMAL HIGH (ref 70–99)
Potassium: 4.3 mmol/L (ref 3.5–5.1)
Sodium: 138 mmol/L (ref 135–145)

## 2020-10-08 LAB — ECHOCARDIOGRAM COMPLETE
Area-P 1/2: 3.88 cm2
Calc EF: 45.7 %
S' Lateral: 4.4 cm
Single Plane A2C EF: 54.6 %
Single Plane A4C EF: 40.5 %

## 2020-10-08 LAB — GLUCOSE, CAPILLARY
Glucose-Capillary: 132 mg/dL — ABNORMAL HIGH (ref 70–99)
Glucose-Capillary: 81 mg/dL (ref 70–99)

## 2020-10-08 LAB — HEMOGLOBIN A1C
Hgb A1c MFr Bld: 8.3 % — ABNORMAL HIGH (ref 4.8–5.6)
Mean Plasma Glucose: 191.51 mg/dL

## 2020-10-08 LAB — CBG MONITORING, ED
Glucose-Capillary: 132 mg/dL — ABNORMAL HIGH (ref 70–99)
Glucose-Capillary: 106 mg/dL — ABNORMAL HIGH (ref 70–99)

## 2020-10-08 MED ORDER — PERFLUTREN LIPID MICROSPHERE
1.0000 mL | INTRAVENOUS | Status: AC | PRN
  Administered 2020-10-08: 2 mL via INTRAVENOUS
  Filled 2020-10-08: qty 10

## 2020-10-08 NOTE — ED Notes (Addendum)
Pt ambulated to BR and back, continent of 1 stool. Steady gait, denies CP/SOB/dizziness. Still awaiting MD eval of telemetry vs. Stepdown. Pt's wife Para March at bedside. RN update pt and wife on POC. Wife Para March took pt's hearing aids and put them in her purse, battery was dead so she stated she will take them home to recharge.

## 2020-10-08 NOTE — Progress Notes (Signed)
Xpress SARS-CoV-2/FLU/RSV plus assay is intended as an aid in the diagnosis of influenza from Nasopharyngeal swab specimens and should not be used as a sole basis for treatment. Nasal washings and aspirates are unacceptable for Xpert Xpress SARS-CoV-2/FLU/RSV testing.  Fact Sheet for Patients: BloggerCourse.com  Fact Sheet for Healthcare Providers: SeriousBroker.it  This test is not yet approved or cleared by the [place] FDA and has been authorized for detection and/or diagnosis of SARS-CoV-2 by FDA under an Emergency Use Authorization (EUA). This EUA will remain in effect (meaning this test can be used) for the duration of  the COVID-19 declaration under Section 564(b)(1) of the Act, 21 U.S.C. section 360bbb-3(b)(1), unless the authorization is terminated or revoked.  Performed at [facility], [address], [place], [place] [address]     Radiology Studies: DG Chest 1 View  Result Date: 10/07/2020 CLINICAL DATA:  Near syncope, weakness EXAM: CHEST  1 VIEW COMPARISON:  12/26/2015 FINDINGS: Stable cardiomediastinal contours. No focal airspace consolidation, pleural effusion, or pneumothorax. Degenerative changes of the shoulders. IMPRESSION: No active disease. Electronically Signed   By: Duanne Guess D.O.   On: 10/07/2020 15:45   ECHOCARDIOGRAM COMPLETE  Result Date: 10/08/2020    ECHOCARDIOGRAM REPORT   Patient Name:   Karl Peters Date of Exam: 10/08/2020 Medical Rec #:  [id number]     Height:       68.0 in Accession #:    [id number]    Weight:       287.0 lb Date of Birth:  [birth year]    BSA:          2.383 m Patient Age:    79 years      BP:           141/72 mmHg Patient Gender: M             HR:           81 bpm. Exam Location:  Inpatient Procedure: 2D Echo, Cardiac Doppler, Color Doppler and Intracardiac            Opacification Agent Indications:    Abnormal ECG R94.31  History:        Patient has prior history of Echocardiogram examinations, most                 recent 03/19/2016. Arrythmias:RBBB, PVC, Bradycardia and                 Ventricular tachycardia. First-degree AV block. Trifascicular                 block; Risk Factors:Hypertension, Diabetes and Dyslipidemia.  Sonographer:    Leta Jungling RDCS Referring Phys: [phone] JARED M GARDNER  Sonographer Comments: Technically difficult study due to poor echo windows. IMPRESSIONS  1. Left ventricular ejection fraction, by estimation, is 40 to 45% with significant beat to beat variability. The left ventricle has mildly decreased function. The left ventricle has no regional wall motion abnormalities. Left ventricular diastolic parameters are indeterminate.   2. Right ventricular systolic function is normal. The right ventricular size is normal. There is normal pulmonary artery systolic pressure. The estimated right ventricular systolic pressure is 34.8 mmHg.  3. The mitral valve is grossly normal. Trivial mitral valve regurgitation. No evidence of mitral stenosis.  4. The aortic valve is grossly normal. Aortic valve regurgitation is trivial. No aortic stenosis is present.  5. The inferior vena cava is normal in size with <50% respiratory variability, suggesting right atrial pressure  Xpress SARS-CoV-2/FLU/RSV plus assay is intended as an aid in the diagnosis of influenza from Nasopharyngeal swab specimens and should not be used as a sole basis for treatment. Nasal washings and aspirates are unacceptable for Xpert Xpress SARS-CoV-2/FLU/RSV testing.  Fact Sheet for Patients: BloggerCourse.com  Fact Sheet for Healthcare Providers: SeriousBroker.it  This test is not yet approved or cleared by the [place] FDA and has been authorized for detection and/or diagnosis of SARS-CoV-2 by FDA under an Emergency Use Authorization (EUA). This EUA will remain in effect (meaning this test can be used) for the duration of  the COVID-19 declaration under Section 564(b)(1) of the Act, 21 U.S.C. section 360bbb-3(b)(1), unless the authorization is terminated or revoked.  Performed at [facility], [address], [place], [place] [address]     Radiology Studies: DG Chest 1 View  Result Date: 10/07/2020 CLINICAL DATA:  Near syncope, weakness EXAM: CHEST  1 VIEW COMPARISON:  12/26/2015 FINDINGS: Stable cardiomediastinal contours. No focal airspace consolidation, pleural effusion, or pneumothorax. Degenerative changes of the shoulders. IMPRESSION: No active disease. Electronically Signed   By: Duanne Guess D.O.   On: 10/07/2020 15:45   ECHOCARDIOGRAM COMPLETE  Result Date: 10/08/2020    ECHOCARDIOGRAM REPORT   Patient Name:   Karl Peters Date of Exam: 10/08/2020 Medical Rec #:  [id number]     Height:       68.0 in Accession #:    [id number]    Weight:       287.0 lb Date of Birth:  [birth year]    BSA:          2.383 m Patient Age:    79 years      BP:           141/72 mmHg Patient Gender: M             HR:           81 bpm. Exam Location:  Inpatient Procedure: 2D Echo, Cardiac Doppler, Color Doppler and Intracardiac            Opacification Agent Indications:    Abnormal ECG R94.31  History:        Patient has prior history of Echocardiogram examinations, most                 recent 03/19/2016. Arrythmias:RBBB, PVC, Bradycardia and                 Ventricular tachycardia. First-degree AV block. Trifascicular                 block; Risk Factors:Hypertension, Diabetes and Dyslipidemia.  Sonographer:    Leta Jungling RDCS Referring Phys: [phone] JARED M GARDNER  Sonographer Comments: Technically difficult study due to poor echo windows. IMPRESSIONS  1. Left ventricular ejection fraction, by estimation, is 40 to 45% with significant beat to beat variability. The left ventricle has mildly decreased function. The left ventricle has no regional wall motion abnormalities. Left ventricular diastolic parameters are indeterminate.   2. Right ventricular systolic function is normal. The right ventricular size is normal. There is normal pulmonary artery systolic pressure. The estimated right ventricular systolic pressure is 34.8 mmHg.  3. The mitral valve is grossly normal. Trivial mitral valve regurgitation. No evidence of mitral stenosis.  4. The aortic valve is grossly normal. Aortic valve regurgitation is trivial. No aortic stenosis is present.  5. The inferior vena cava is normal in size with <50% respiratory variability, suggesting right atrial pressure  PROGRESS NOTE    Karl Peters  YQM:[id number] DOB: 11-13-1940 DOA: 10/07/2020 PCP: Elizabeth PalauAnderson, Teresa, FNP   Brief Narrative:  This 80 years old male with PMH significant for type 2 diabetes, hypertension, hyperlipidemia, prior NSVT and symptomatic PVCs treated with beta-blockers , presented in the ED with generalized weakness.  Patient was started on Jardiance 2 weeks ago which made him feel so bad,  he stopped taking it, then he presented with nausea, vomiting and generalized weakness.  He reports he was working in his garden when he became very tired and had to sit down and developed short of breath but denies any chest pain. He is found to have slightly elevated serum creatinine from his baseline, EKG shows some abnormalities multiple PVCs.  Cardiology was consulted, recommended to repeat echocardiogram and would not pursue another ischemic evaluation unless there is any change in his systolic function.  Continue Coreg twice.  Assessment & Plan:   Principal Problem:   AKI (acute kidney injury) (HCC) Active Problems:   DM2 (diabetes mellitus, type 2) (HCC)   Essential hypertension   NSVT (nonsustained ventricular tachycardia) (HCC)   Hypotension   Arrhythmia  Acute kidney injury: Presented with serum creatinine of 1.7 slightly increased from his baseline 1.1 This could be prerenal due to nausea and vomiting. Continue gentle IV hydration, monitor serum creatinine.  Avoid nephrotoxic medication  Abnormal EKG: Patient has multiple PVCs on EKG Cardiology consulted recommended repeat echocardiogram. Would not pursue ischemic work-up unless there is a significant changes in systolic function. Increase Coreg to twice daily.  Hypertension: Continue Coreg, Norvasc . resume valsartan once renal function improves.  DM2: Hold oral diabetic medications Regular insulin sliding scale  Hyperlipidemia :  continue Crestor  RUQ pain LFTs normal, ultrasound unremarkable.   DVT  prophylaxis: Heparin subcu Code Status: Full code Family Communication: No family at bedside Disposition Plan:  Status is: Observation  The patient remains OBS appropriate and will d/c before 2 midnights.  Dispo: The patient is from: Home              Anticipated d/c is to: Home              Patient currently is not medically stable to d/c.   Difficult to place patient No  Consultants:  Cardiology  Procedures: Echocardiogram Antimicrobials:   Anti-infectives (From admission, onward)    None       Subjective: Patient was seen and examined at bedside.  Overnight events noted.   Patient reports feeling much improved.  Denies any right upper quadrant pain.   Reports nausea and vomiting is improving.  Objective: Vitals:   10/08/20 1000 10/08/20 1100 10/08/20 1203 10/08/20 1400  BP: (!) 142/72 130/73 (!) 144/77 (!) 114/55  Pulse: 81 (!) 34 75 (!) 54  Resp: 15 13 (!) 21 17  Temp:      TempSrc:      SpO2: 96% 97% 99% 95%   No intake or output data in the 24 hours ending 10/08/20 1543 There were no vitals filed for this visit.  Examination:  General exam: Appears comfortable, not in any acute distress. Respiratory system: Clear to auscultation. Respiratory effort normal. Cardiovascular system: S1 & S2 heard, RRR. No JVD, murmurs, rubs, gallops or clicks. No pedal edema. Gastrointestinal system: Abdomen is soft, nontender, nondistended, bowel sounds normal.   Central nervous system: Alert and oriented. No focal neurological deficits. Extremities: No edema, no clubbing,  no cyanosis. Skin: No rashes, lesions or ulcers Psychiatry:  PROGRESS NOTE    Karl Peters  YQM:[id number] DOB: 11-13-1940 DOA: 10/07/2020 PCP: Elizabeth PalauAnderson, Teresa, FNP   Brief Narrative:  This 80 years old male with PMH significant for type 2 diabetes, hypertension, hyperlipidemia, prior NSVT and symptomatic PVCs treated with beta-blockers , presented in the ED with generalized weakness.  Patient was started on Jardiance 2 weeks ago which made him feel so bad,  he stopped taking it, then he presented with nausea, vomiting and generalized weakness.  He reports he was working in his garden when he became very tired and had to sit down and developed short of breath but denies any chest pain. He is found to have slightly elevated serum creatinine from his baseline, EKG shows some abnormalities multiple PVCs.  Cardiology was consulted, recommended to repeat echocardiogram and would not pursue another ischemic evaluation unless there is any change in his systolic function.  Continue Coreg twice.  Assessment & Plan:   Principal Problem:   AKI (acute kidney injury) (HCC) Active Problems:   DM2 (diabetes mellitus, type 2) (HCC)   Essential hypertension   NSVT (nonsustained ventricular tachycardia) (HCC)   Hypotension   Arrhythmia  Acute kidney injury: Presented with serum creatinine of 1.7 slightly increased from his baseline 1.1 This could be prerenal due to nausea and vomiting. Continue gentle IV hydration, monitor serum creatinine.  Avoid nephrotoxic medication  Abnormal EKG: Patient has multiple PVCs on EKG Cardiology consulted recommended repeat echocardiogram. Would not pursue ischemic work-up unless there is a significant changes in systolic function. Increase Coreg to twice daily.  Hypertension: Continue Coreg, Norvasc . resume valsartan once renal function improves.  DM2: Hold oral diabetic medications Regular insulin sliding scale  Hyperlipidemia :  continue Crestor  RUQ pain LFTs normal, ultrasound unremarkable.   DVT  prophylaxis: Heparin subcu Code Status: Full code Family Communication: No family at bedside Disposition Plan:  Status is: Observation  The patient remains OBS appropriate and will d/c before 2 midnights.  Dispo: The patient is from: Home              Anticipated d/c is to: Home              Patient currently is not medically stable to d/c.   Difficult to place patient No  Consultants:  Cardiology  Procedures: Echocardiogram Antimicrobials:   Anti-infectives (From admission, onward)    None       Subjective: Patient was seen and examined at bedside.  Overnight events noted.   Patient reports feeling much improved.  Denies any right upper quadrant pain.   Reports nausea and vomiting is improving.  Objective: Vitals:   10/08/20 1000 10/08/20 1100 10/08/20 1203 10/08/20 1400  BP: (!) 142/72 130/73 (!) 144/77 (!) 114/55  Pulse: 81 (!) 34 75 (!) 54  Resp: 15 13 (!) 21 17  Temp:      TempSrc:      SpO2: 96% 97% 99% 95%   No intake or output data in the 24 hours ending 10/08/20 1543 There were no vitals filed for this visit.  Examination:  General exam: Appears comfortable, not in any acute distress. Respiratory system: Clear to auscultation. Respiratory effort normal. Cardiovascular system: S1 & S2 heard, RRR. No JVD, murmurs, rubs, gallops or clicks. No pedal edema. Gastrointestinal system: Abdomen is soft, nontender, nondistended, bowel sounds normal.   Central nervous system: Alert and oriented. No focal neurological deficits. Extremities: No edema, no clubbing,  no cyanosis. Skin: No rashes, lesions or ulcers Psychiatry:  Xpress SARS-CoV-2/FLU/RSV plus assay is intended as an aid in the diagnosis of influenza from Nasopharyngeal swab specimens and should not be used as a sole basis for treatment. Nasal washings and aspirates are unacceptable for Xpert Xpress SARS-CoV-2/FLU/RSV testing.  Fact Sheet for Patients: BloggerCourse.com  Fact Sheet for Healthcare Providers: SeriousBroker.it  This test is not yet approved or cleared by the [place] FDA and has been authorized for detection and/or diagnosis of SARS-CoV-2 by FDA under an Emergency Use Authorization (EUA). This EUA will remain in effect (meaning this test can be used) for the duration of  the COVID-19 declaration under Section 564(b)(1) of the Act, 21 U.S.C. section 360bbb-3(b)(1), unless the authorization is terminated or revoked.  Performed at [facility], [address], [place], [place] [address]     Radiology Studies: DG Chest 1 View  Result Date: 10/07/2020 CLINICAL DATA:  Near syncope, weakness EXAM: CHEST  1 VIEW COMPARISON:  12/26/2015 FINDINGS: Stable cardiomediastinal contours. No focal airspace consolidation, pleural effusion, or pneumothorax. Degenerative changes of the shoulders. IMPRESSION: No active disease. Electronically Signed   By: Duanne Guess D.O.   On: 10/07/2020 15:45   ECHOCARDIOGRAM COMPLETE  Result Date: 10/08/2020    ECHOCARDIOGRAM REPORT   Patient Name:   Karl Peters Date of Exam: 10/08/2020 Medical Rec #:  [id number]     Height:       68.0 in Accession #:    [id number]    Weight:       287.0 lb Date of Birth:  [birth year]    BSA:          2.383 m Patient Age:    79 years      BP:           141/72 mmHg Patient Gender: M             HR:           81 bpm. Exam Location:  Inpatient Procedure: 2D Echo, Cardiac Doppler, Color Doppler and Intracardiac            Opacification Agent Indications:    Abnormal ECG R94.31  History:        Patient has prior history of Echocardiogram examinations, most                 recent 03/19/2016. Arrythmias:RBBB, PVC, Bradycardia and                 Ventricular tachycardia. First-degree AV block. Trifascicular                 block; Risk Factors:Hypertension, Diabetes and Dyslipidemia.  Sonographer:    Leta Jungling RDCS Referring Phys: [phone] JARED M GARDNER  Sonographer Comments: Technically difficult study due to poor echo windows. IMPRESSIONS  1. Left ventricular ejection fraction, by estimation, is 40 to 45% with significant beat to beat variability. The left ventricle has mildly decreased function. The left ventricle has no regional wall motion abnormalities. Left ventricular diastolic parameters are indeterminate.   2. Right ventricular systolic function is normal. The right ventricular size is normal. There is normal pulmonary artery systolic pressure. The estimated right ventricular systolic pressure is 34.8 mmHg.  3. The mitral valve is grossly normal. Trivial mitral valve regurgitation. No evidence of mitral stenosis.  4. The aortic valve is grossly normal. Aortic valve regurgitation is trivial. No aortic stenosis is present.  5. The inferior vena cava is normal in size with <50% respiratory variability, suggesting right atrial pressure

## 2020-10-08 NOTE — ED Notes (Addendum)
Pt c/o nausea with med admin, RN gave PRN zofran per order. Pt awake in bed, ate breakfast, gave pt water, emptied urinal. Pt denies pain, denies needs. No distress noted, VSS. Call bell in reach.  RN paged Dr Lucianne Muss to see if pt is telemetry appropriate instead of progressive status as VSS, not requiring progressive level of care. Awaiting response.

## 2020-10-08 NOTE — ED Notes (Signed)
Pt placed on hospital bed

## 2020-10-08 NOTE — Progress Notes (Signed)
  Echocardiogram 2D Echocardiogram has been performed.  Leta Jungling M 10/08/2020, 10:48 AM

## 2020-10-08 NOTE — ED Notes (Addendum)
Both of patients hearing aids in cup at bedside with patient label on.

## 2020-10-09 ENCOUNTER — Inpatient Hospital Stay (HOSPITAL_COMMUNITY): Payer: Medicare Other

## 2020-10-09 DIAGNOSIS — I952 Hypotension due to drugs: Secondary | ICD-10-CM | POA: Diagnosis not present

## 2020-10-09 DIAGNOSIS — I959 Hypotension, unspecified: Secondary | ICD-10-CM | POA: Diagnosis present

## 2020-10-09 DIAGNOSIS — Z888 Allergy status to other drugs, medicaments and biological substances status: Secondary | ICD-10-CM | POA: Diagnosis not present

## 2020-10-09 DIAGNOSIS — R7881 Bacteremia: Secondary | ICD-10-CM | POA: Diagnosis not present

## 2020-10-09 DIAGNOSIS — I5032 Chronic diastolic (congestive) heart failure: Secondary | ICD-10-CM | POA: Diagnosis present

## 2020-10-09 DIAGNOSIS — Y848 Other medical procedures as the cause of abnormal reaction of the patient, or of later complication, without mention of misadventure at the time of the procedure: Secondary | ICD-10-CM | POA: Diagnosis not present

## 2020-10-09 DIAGNOSIS — I428 Other cardiomyopathies: Secondary | ICD-10-CM | POA: Diagnosis present

## 2020-10-09 DIAGNOSIS — I420 Dilated cardiomyopathy: Secondary | ICD-10-CM

## 2020-10-09 DIAGNOSIS — E119 Type 2 diabetes mellitus without complications: Secondary | ICD-10-CM | POA: Diagnosis present

## 2020-10-09 DIAGNOSIS — M7989 Other specified soft tissue disorders: Secondary | ICD-10-CM | POA: Diagnosis not present

## 2020-10-09 DIAGNOSIS — E669 Obesity, unspecified: Secondary | ICD-10-CM | POA: Diagnosis present

## 2020-10-09 DIAGNOSIS — I493 Ventricular premature depolarization: Secondary | ICD-10-CM | POA: Diagnosis present

## 2020-10-09 DIAGNOSIS — Z7984 Long term (current) use of oral hypoglycemic drugs: Secondary | ICD-10-CM | POA: Diagnosis not present

## 2020-10-09 DIAGNOSIS — R0602 Shortness of breath: Secondary | ICD-10-CM | POA: Diagnosis present

## 2020-10-09 DIAGNOSIS — E785 Hyperlipidemia, unspecified: Secondary | ICD-10-CM | POA: Diagnosis present

## 2020-10-09 DIAGNOSIS — T8172XA Complication of vein following a procedure, not elsewhere classified, initial encounter: Secondary | ICD-10-CM | POA: Diagnosis not present

## 2020-10-09 DIAGNOSIS — N179 Acute kidney failure, unspecified: Secondary | ICD-10-CM | POA: Diagnosis present

## 2020-10-09 DIAGNOSIS — I2510 Atherosclerotic heart disease of native coronary artery without angina pectoris: Secondary | ICD-10-CM | POA: Diagnosis present

## 2020-10-09 DIAGNOSIS — I808 Phlebitis and thrombophlebitis of other sites: Secondary | ICD-10-CM | POA: Diagnosis not present

## 2020-10-09 DIAGNOSIS — M79602 Pain in left arm: Secondary | ICD-10-CM | POA: Diagnosis not present

## 2020-10-09 DIAGNOSIS — Z79899 Other long term (current) drug therapy: Secondary | ICD-10-CM | POA: Diagnosis not present

## 2020-10-09 DIAGNOSIS — I10 Essential (primary) hypertension: Secondary | ICD-10-CM | POA: Diagnosis not present

## 2020-10-09 DIAGNOSIS — B950 Streptococcus, group A, as the cause of diseases classified elsewhere: Secondary | ICD-10-CM | POA: Diagnosis not present

## 2020-10-09 DIAGNOSIS — Z6839 Body mass index (BMI) 39.0-39.9, adult: Secondary | ICD-10-CM | POA: Diagnosis not present

## 2020-10-09 DIAGNOSIS — I429 Cardiomyopathy, unspecified: Secondary | ICD-10-CM | POA: Diagnosis not present

## 2020-10-09 DIAGNOSIS — R609 Edema, unspecified: Secondary | ICD-10-CM | POA: Diagnosis not present

## 2020-10-09 DIAGNOSIS — I110 Hypertensive heart disease with heart failure: Secondary | ICD-10-CM | POA: Diagnosis present

## 2020-10-09 DIAGNOSIS — R55 Syncope and collapse: Secondary | ICD-10-CM | POA: Diagnosis present

## 2020-10-09 DIAGNOSIS — I252 Old myocardial infarction: Secondary | ICD-10-CM | POA: Diagnosis not present

## 2020-10-09 DIAGNOSIS — I472 Ventricular tachycardia: Secondary | ICD-10-CM | POA: Diagnosis not present

## 2020-10-09 DIAGNOSIS — Z8249 Family history of ischemic heart disease and other diseases of the circulatory system: Secondary | ICD-10-CM | POA: Diagnosis not present

## 2020-10-09 DIAGNOSIS — R9439 Abnormal result of other cardiovascular function study: Secondary | ICD-10-CM | POA: Diagnosis not present

## 2020-10-09 DIAGNOSIS — Z20822 Contact with and (suspected) exposure to covid-19: Secondary | ICD-10-CM | POA: Diagnosis present

## 2020-10-09 DIAGNOSIS — L538 Other specified erythematous conditions: Secondary | ICD-10-CM | POA: Diagnosis not present

## 2020-10-09 DIAGNOSIS — I82612 Acute embolism and thrombosis of superficial veins of left upper extremity: Secondary | ICD-10-CM | POA: Diagnosis not present

## 2020-10-09 LAB — GLUCOSE, CAPILLARY
Glucose-Capillary: 86 mg/dL (ref 70–99)
Glucose-Capillary: 101 mg/dL — ABNORMAL HIGH (ref 70–99)
Glucose-Capillary: 154 mg/dL — ABNORMAL HIGH (ref 70–99)
Glucose-Capillary: 90 mg/dL (ref 70–99)

## 2020-10-09 LAB — BASIC METABOLIC PANEL
Anion gap: 9 (ref 5–15)
BUN: 23 mg/dL (ref 8–23)
CO2: 27 mmol/L (ref 22–32)
Calcium: 9.2 mg/dL (ref 8.9–10.3)
Chloride: 104 mmol/L (ref 98–111)
Creatinine, Ser: 1.63 mg/dL — ABNORMAL HIGH (ref 0.61–1.24)
GFR, Estimated: 43 mL/min — ABNORMAL LOW (ref >60)
Glucose, Bld: 80 mg/dL (ref 70–99)
Potassium: 5.1 mmol/L (ref 3.5–5.1)
Sodium: 140 mmol/L (ref 135–145)

## 2020-10-09 LAB — NM MYOCAR MULTI W/SPECT W/WALL MOTION / EF
Base ST Depression (mm): 0 mm
Estimated workload: 1
Exercise duration (min): 0 min
Exercise duration (sec): 0 s
MPHR: 142 {beats}/min
Peak HR: 83 {beats}/min
Percent HR: 58 %
Rest HR: 64 {beats}/min
Rest Nuclear Isotope Dose: 9.9 mCi
ST Depression (mm): 0 mm
Stress Nuclear Isotope Dose: 30.3 mCi

## 2020-10-09 MED ORDER — REGADENOSON 0.4 MG/5ML IV SOLN
0.4000 mg | Freq: Once | INTRAVENOUS | Status: AC
  Filled 2020-10-09: qty 5

## 2020-10-09 MED ORDER — SODIUM CHLORIDE 0.9% FLUSH
3.0000 mL | INTRAVENOUS | Status: DC | PRN
Start: 2020-10-09 — End: 2020-10-10

## 2020-10-09 MED ORDER — SODIUM CHLORIDE 0.9 % IV SOLN
250.0000 mL | INTRAVENOUS | Status: DC | PRN
Start: 2020-10-09 — End: 2020-10-10

## 2020-10-09 MED ORDER — REGADENOSON 0.4 MG/5ML IV SOLN
INTRAVENOUS | Status: AC
  Administered 2020-10-09: 0.4 mg via INTRAVENOUS
  Filled 2020-10-09: qty 5

## 2020-10-09 MED ORDER — ASPIRIN 81 MG PO CHEW
81.0000 mg | CHEWABLE_TABLET | ORAL | Status: AC
  Administered 2020-10-10: 81 mg via ORAL
  Filled 2020-10-09: qty 1

## 2020-10-09 MED ORDER — SODIUM CHLORIDE 0.9 % IV SOLN: INTRAVENOUS | Status: DC

## 2020-10-09 MED ORDER — TECHNETIUM TC 99M TETROFOSMIN IV KIT
9.9000 | PACK | Freq: Once | INTRAVENOUS | Status: AC | PRN
  Administered 2020-10-09: 9.9 via INTRAVENOUS

## 2020-10-09 MED ORDER — SODIUM CHLORIDE 0.9% FLUSH
3.0000 mL | Freq: Two times a day (BID) | INTRAVENOUS | Status: DC
Start: 2020-10-09 — End: 2020-10-14
  Administered 2020-10-09 – 2020-10-14 (×3): 3 mL via INTRAVENOUS

## 2020-10-09 MED ORDER — TECHNETIUM TC 99M TETROFOSMIN IV KIT
30.3000 | PACK | Freq: Once | INTRAVENOUS | Status: AC | PRN
  Administered 2020-10-09: 30.3 via INTRAVENOUS

## 2020-10-09 MED ORDER — METOPROLOL TARTRATE 25 MG PO TABS
25.0000 mg | ORAL_TABLET | Freq: Two times a day (BID) | ORAL | Status: DC
  Administered 2020-10-09 (×2): 25 mg via ORAL
  Filled 2020-10-09 (×2): qty 1

## 2020-10-09 NOTE — Progress Notes (Signed)
yet approved or cleared by the [place] and  has been authorized for detection and/or diagnosis of SARS-CoV-2 by FDA under an Emergency Use Authorization (EUA). This EUA will remain  in effect (meaning this test can be used) for the duration of the COVID-19 declaration under Section 564(b)(1) of the Act, 21 U.S.C.section 360bbb-3(b)(1), unless the authorization is terminated  or revoked sooner.       Influenza Zakye Baby by PCR NEGATIVE NEGATIVE Final   Influenza B by PCR NEGATIVE NEGATIVE Final    Comment: (NOTE) The Xpert Xpress SARS-CoV-2/FLU/RSV plus assay is intended as an aid in the diagnosis of influenza from Nasopharyngeal swab specimens and should not be used as Alben Jepsen sole basis for treatment. Nasal washings and aspirates are unacceptable for Xpert Xpress SARS-CoV-2/FLU/RSV testing.  Fact Sheet for Patients: BloggerCourse.com  Fact Sheet for Healthcare  Providers: SeriousBroker.it  This test is not yet approved or cleared by the [place] FDA and has been authorized for detection and/or diagnosis of SARS-CoV-2 by FDA under an Emergency Use Authorization (EUA). This EUA will remain in effect (meaning this test can be used) for the duration of the COVID-19 declaration under Section 564(b)(1) of the Act, 21 U.S.C. section 360bbb-3(b)(1), unless the authorization is terminated or revoked.  Performed at [facility], 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 The study is high risk.   No ST deviation was noted.   LV perfusion is abnormal.   Defect 1: There is Areli Jowett large defect with moderate reduction in uptake present in the apical to basal inferior and inferolateral location(s) that is fixed. There is abnormal wall motion in the defect area. Consistent with infarction.   The left ventricular ejection fraction is severely decreased (<30%). Left ventricular function is abnormal. Global function is severely reduced. End diastolic cavity size is moderately enlarged. End systolic cavity size is moderately enlarged. Difficulty in assessment of EF/wall motion due to frequent ectopy. Large area of reduced perfusion at both rest and stress in the inferior and inferolateral wall, from apex to base. Concerning for infarct. Findings communicated with Dr. Mayford Knife.   ECHOCARDIOGRAM COMPLETE  Result Date: 10/08/2020    ECHOCARDIOGRAM REPORT   Patient Name:   Karl Peters Date of Exam: 10/08/2020 Medical Rec #:  [id number]     Height:       68.0 in Accession #:    [id number]    Weight:       287.0 lb Date of Birth:  22-Nov-1940    BSA:          2.383 m Patient Age:    80 years      BP:           141/72 mmHg Patient Gender: M             HR:           81 bpm. Exam Location:   Inpatient Procedure: 2D Echo, Cardiac Doppler, Color Doppler and Intracardiac            Opacification Agent Indications:    Abnormal ECG R94.31  History:        Patient has prior history of Echocardiogram examinations, most                 recent 03/19/2016. Arrythmias:RBBB, PVC, Bradycardia  yet approved or cleared by the [place] and  has been authorized for detection and/or diagnosis of SARS-CoV-2 by FDA under an Emergency Use Authorization (EUA). This EUA will remain  in effect (meaning this test can be used) for the duration of the COVID-19 declaration under Section 564(b)(1) of the Act, 21 U.S.C.section 360bbb-3(b)(1), unless the authorization is terminated  or revoked sooner.       Influenza Zakye Baby by PCR NEGATIVE NEGATIVE Final   Influenza B by PCR NEGATIVE NEGATIVE Final    Comment: (NOTE) The Xpert Xpress SARS-CoV-2/FLU/RSV plus assay is intended as an aid in the diagnosis of influenza from Nasopharyngeal swab specimens and should not be used as Alben Jepsen sole basis for treatment. Nasal washings and aspirates are unacceptable for Xpert Xpress SARS-CoV-2/FLU/RSV testing.  Fact Sheet for Patients: BloggerCourse.com  Fact Sheet for Healthcare  Providers: SeriousBroker.it  This test is not yet approved or cleared by the [place] FDA and has been authorized for detection and/or diagnosis of SARS-CoV-2 by FDA under an Emergency Use Authorization (EUA). This EUA will remain in effect (meaning this test can be used) for the duration of the COVID-19 declaration under Section 564(b)(1) of the Act, 21 U.S.C. section 360bbb-3(b)(1), unless the authorization is terminated or revoked.  Performed at [facility], 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 The study is high risk.   No ST deviation was noted.   LV perfusion is abnormal.   Defect 1: There is Areli Jowett large defect with moderate reduction in uptake present in the apical to basal inferior and inferolateral location(s) that is fixed. There is abnormal wall motion in the defect area. Consistent with infarction.   The left ventricular ejection fraction is severely decreased (<30%). Left ventricular function is abnormal. Global function is severely reduced. End diastolic cavity size is moderately enlarged. End systolic cavity size is moderately enlarged. Difficulty in assessment of EF/wall motion due to frequent ectopy. Large area of reduced perfusion at both rest and stress in the inferior and inferolateral wall, from apex to base. Concerning for infarct. Findings communicated with Dr. Mayford Knife.   ECHOCARDIOGRAM COMPLETE  Result Date: 10/08/2020    ECHOCARDIOGRAM REPORT   Patient Name:   Karl Peters Date of Exam: 10/08/2020 Medical Rec #:  [id number]     Height:       68.0 in Accession #:    [id number]    Weight:       287.0 lb Date of Birth:  22-Nov-1940    BSA:          2.383 m Patient Age:    80 years      BP:           141/72 mmHg Patient Gender: M             HR:           81 bpm. Exam Location:   Inpatient Procedure: 2D Echo, Cardiac Doppler, Color Doppler and Intracardiac            Opacification Agent Indications:    Abnormal ECG R94.31  History:        Patient has prior history of Echocardiogram examinations, most                 recent 03/19/2016. Arrythmias:RBBB, PVC, Bradycardia  PROGRESS NOTE    Jeannetta EllisJoseph B Elsbernd  ZOX:[id number] DOB: 04/09/1940 DOA: 10/07/2020 PCP: Elizabeth PalauAnderson, Teresa, FNP   Chief Complaint  Patient presents with   Weakness   Nausea   Brief Narrative:  This 80 years old male with PMH significant for type 2 diabetes, hypertension, hyperlipidemia, prior NSVT and symptomatic PVCs treated with beta-blockers , presented in the ED with generalized weakness.  Patient was started on Jardiance 2 weeks ago which made him feel so bad,  he stopped taking it, then he presented with nausea, vomiting and generalized weakness.  He reports he was working in his garden when he became very tired and had to sit down and developed short of breath but denies any chest pain.  He is found to have slightly elevated serum creatinine from his baseline, EKG shows some abnormalities multiple PVCs.  Cardiology was consulted, recommended to repeat echocardiogram and would not pursue another ischemic evaluation unless there is any change in his systolic function.  Continue Coreg twice.  He's now s/p stress test, area of reduced perfusion concerning for infarct.  Planning for cath 8/25 AM.  Assessment & Plan:   Principal Problem:   AKI (acute kidney injury) (HCC) Active Problems:   DM2 (diabetes mellitus, type 2) (HCC)   Essential hypertension   NSVT (nonsustained ventricular tachycardia) (HCC)   Hypotension   Arrhythmia  Frequent PVC's  Abnormal EKG  Abnormal Stress Test: Echo with EF 40-45%, mildly decreased function - no RWMA Myoview with large area of reduced perfusion at rest and stress in inferior and inferolateral wall from apex to base, concerning for infarct Patient has multiple PVCs on EKG [facility], [facility]25 Transition to lopressor from coreg Planning for EP c/s for PVC's with new LV dysfunction per cards  Acute kidney injury: Presented with serum creatinine of 1.7 increased from his baseline 1.1 Creatinine relatively stable today Follow  UA and renal US Continue IVF Holding ARB  Hypertension: Continue Coreg, Norvasc . resume valsartan once renal function improves.   DM2: Hold oral diabetic medications Regular insulin sliding scale   Hyperlipidemia :  continue Crestor   RUQ pain LFTs normal, ultrasound unremarkable.   DVT prophylaxis: heparin  Code Status: full  Family Communication: none at bedside Disposition:   Status is: Inpatient  Remains inpatient appropriate because:Inpatient level of care appropriate due to severity of illness  Dispo: The patient is from: Home              Anticipated d/c is to: Home              Patient currently is not medically stable to d/c.   Difficult to place patient No       Consultants:  cardiologt  Procedures:  Echo IMPRESSIONS     1. Left ventricular ejection fraction, by estimation, is 40 to 45% with  significant beat to beat variability. The left ventricle has mildly  decreased function. The left ventricle has no regional wall motion  abnormalities. Left ventricular diastolic  parameters are indeterminate.   2. Right ventricular systolic function is normal. The right ventricular  size is normal. There is normal pulmonary artery systolic pressure. The  estimated right ventricular systolic pressure is 34.8 mmHg.   3. The mitral valve is grossly normal. Trivial mitral valve  regurgitation. No evidence of mitral stenosis.   4. The aortic valve is grossly normal. Aortic valve regurgitation is  trivial. No aortic stenosis is present.   5. The inferior vena cava  yet approved or cleared by the [place] and  has been authorized for detection and/or diagnosis of SARS-CoV-2 by FDA under an Emergency Use Authorization (EUA). This EUA will remain  in effect (meaning this test can be used) for the duration of the COVID-19 declaration under Section 564(b)(1) of the Act, 21 U.S.C.section 360bbb-3(b)(1), unless the authorization is terminated  or revoked sooner.       Influenza Zakye Baby by PCR NEGATIVE NEGATIVE Final   Influenza B by PCR NEGATIVE NEGATIVE Final    Comment: (NOTE) The Xpert Xpress SARS-CoV-2/FLU/RSV plus assay is intended as an aid in the diagnosis of influenza from Nasopharyngeal swab specimens and should not be used as Alben Jepsen sole basis for treatment. Nasal washings and aspirates are unacceptable for Xpert Xpress SARS-CoV-2/FLU/RSV testing.  Fact Sheet for Patients: BloggerCourse.com  Fact Sheet for Healthcare  Providers: SeriousBroker.it  This test is not yet approved or cleared by the [place] FDA and has been authorized for detection and/or diagnosis of SARS-CoV-2 by FDA under an Emergency Use Authorization (EUA). This EUA will remain in effect (meaning this test can be used) for the duration of the COVID-19 declaration under Section 564(b)(1) of the Act, 21 U.S.C. section 360bbb-3(b)(1), unless the authorization is terminated or revoked.  Performed at [facility], 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 The study is high risk.   No ST deviation was noted.   LV perfusion is abnormal.   Defect 1: There is Areli Jowett large defect with moderate reduction in uptake present in the apical to basal inferior and inferolateral location(s) that is fixed. There is abnormal wall motion in the defect area. Consistent with infarction.   The left ventricular ejection fraction is severely decreased (<30%). Left ventricular function is abnormal. Global function is severely reduced. End diastolic cavity size is moderately enlarged. End systolic cavity size is moderately enlarged. Difficulty in assessment of EF/wall motion due to frequent ectopy. Large area of reduced perfusion at both rest and stress in the inferior and inferolateral wall, from apex to base. Concerning for infarct. Findings communicated with Dr. Mayford Knife.   ECHOCARDIOGRAM COMPLETE  Result Date: 10/08/2020    ECHOCARDIOGRAM REPORT   Patient Name:   Karl Peters Date of Exam: 10/08/2020 Medical Rec #:  [id number]     Height:       68.0 in Accession #:    [id number]    Weight:       287.0 lb Date of Birth:  22-Nov-1940    BSA:          2.383 m Patient Age:    80 years      BP:           141/72 mmHg Patient Gender: M             HR:           81 bpm. Exam Location:   Inpatient Procedure: 2D Echo, Cardiac Doppler, Color Doppler and Intracardiac            Opacification Agent Indications:    Abnormal ECG R94.31  History:        Patient has prior history of Echocardiogram examinations, most                 recent 03/19/2016. Arrythmias:RBBB, PVC, Bradycardia  PROGRESS NOTE    Jeannetta EllisJoseph B Elsbernd  ZOX:[id number] DOB: 04/09/1940 DOA: 10/07/2020 PCP: Elizabeth PalauAnderson, Teresa, FNP   Chief Complaint  Patient presents with   Weakness   Nausea   Brief Narrative:  This 80 years old male with PMH significant for type 2 diabetes, hypertension, hyperlipidemia, prior NSVT and symptomatic PVCs treated with beta-blockers , presented in the ED with generalized weakness.  Patient was started on Jardiance 2 weeks ago which made him feel so bad,  he stopped taking it, then he presented with nausea, vomiting and generalized weakness.  He reports he was working in his garden when he became very tired and had to sit down and developed short of breath but denies any chest pain.  He is found to have slightly elevated serum creatinine from his baseline, EKG shows some abnormalities multiple PVCs.  Cardiology was consulted, recommended to repeat echocardiogram and would not pursue another ischemic evaluation unless there is any change in his systolic function.  Continue Coreg twice.  He's now s/p stress test, area of reduced perfusion concerning for infarct.  Planning for cath 8/25 AM.  Assessment & Plan:   Principal Problem:   AKI (acute kidney injury) (HCC) Active Problems:   DM2 (diabetes mellitus, type 2) (HCC)   Essential hypertension   NSVT (nonsustained ventricular tachycardia) (HCC)   Hypotension   Arrhythmia  Frequent PVC's  Abnormal EKG  Abnormal Stress Test: Echo with EF 40-45%, mildly decreased function - no RWMA Myoview with large area of reduced perfusion at rest and stress in inferior and inferolateral wall from apex to base, concerning for infarct Patient has multiple PVCs on EKG [facility]iology [facility], [facility]/25 Transition to lopressor from coreg Planning for EP c/s for PVC's with new LV dysfunction per cards  Acute kidney injury: Presented with serum creatinine of 1.7 increased from his baseline 1.1 Creatinine relatively stable today Follow  UA and renal US Continue IVF Holding ARB  Hypertension: Continue Coreg, Norvasc . resume valsartan once renal function improves.   DM2: Hold oral diabetic medications Regular insulin sliding scale   Hyperlipidemia :  continue Crestor   RUQ pain LFTs normal, ultrasound unremarkable.   DVT prophylaxis: heparin  Code Status: full  Family Communication: none at bedside Disposition:   Status is: Inpatient  Remains inpatient appropriate because:Inpatient level of care appropriate due to severity of illness  Dispo: The patient is from: Home              Anticipated d/c is to: Home              Patient currently is not medically stable to d/c.   Difficult to place patient No       Consultants:  cardiologt  Procedures:  Echo IMPRESSIONS     1. Left ventricular ejection fraction, by estimation, is 40 to 45% with  significant beat to beat variability. The left ventricle has mildly  decreased function. The left ventricle has no regional wall motion  abnormalities. Left ventricular diastolic  parameters are indeterminate.   2. Right ventricular systolic function is normal. The right ventricular  size is normal. There is normal pulmonary artery systolic pressure. The  estimated right ventricular systolic pressure is 34.8 mmHg.   3. The mitral valve is grossly normal. Trivial mitral valve  regurgitation. No evidence of mitral stenosis.   4. The aortic valve is grossly normal. Aortic valve regurgitation is  trivial. No aortic stenosis is present.   5. The inferior vena cava

## 2020-10-09 NOTE — Progress Notes (Signed)
   Karl Peters presented for a nuclear stress test today.  No immediate complications.  Stress imaging is pending at this time.  Preliminary EKG findings may be listed in the chart, but the stress test result will not be finalized until perfusion imaging is complete.  One day study, [facility] to read.  Beatriz Stallion, PA-C 10/09/2020, 3:17 PM

## 2020-10-09 NOTE — H&P (Signed)
Abnormal nuclear suggesting myocardial infarction inferior wall

## 2020-10-09 NOTE — Progress Notes (Signed)
   10/09/20 1135  Clinical Encounter Type  Visited With Patient not available  Visit Type Initial  Referral From Nurse  Consult/Referral To Chaplain   Chaplain responded to consult. Pt was sleeping upon arrival. Advance Directive paperwork was left on bedside table. Please page if education and/or notarization is needed.    This note was prepared by Chaplain Resident, Tacy Learn, MDiv. Chaplain remains available as needed through the on-call pager: [phone].

## 2020-10-09 NOTE — Progress Notes (Signed)
Progress Note  Patient Name: Karl Peters Date of Encounter: 10/09/2020  [facility] Cardiologist: None   Subjective   Denies any chest pain.  Having frequent ventricular couplets on tele  Inpatient Medications    Scheduled Meds:  buPROPion  300 mg Oral q morning   carvedilol  12.5 mg Oral BID WC   heparin  5,000 Units Subcutaneous Q8H   insulin aspart  0-15 Units Subcutaneous TID WC   insulin aspart  0-5 Units Subcutaneous QHS   montelukast  10 mg Oral q morning   pantoprazole  40 mg Oral q morning   rosuvastatin  40 mg Oral q morning   vitamin B-12  1,000 mcg Oral q morning   Continuous Infusions:  PRN Meds: acetaminophen **OR** acetaminophen, ondansetron **OR** ondansetron (ZOFRAN) IV   Vital Signs    Vitals:   10/08/20 1745 10/08/20 1954 10/09/20 0139 10/09/20 0515  BP:  (!) 148/70 104/83 122/62  Pulse:  86 65 73  Resp:  20 16 16   Temp:  98.6 F (37 C) 98 F (36.7 C) 98.2 F (36.8 C)  TempSrc:  Oral Oral Oral  SpO2:  100% 95% 93%  Weight: 121.3 kg     Height:        Intake/Output Summary (Last 24 hours) at 10/09/2020 0723 Last data filed at 10/09/2020 0543 Gross per 24 hour  Intake 360 ml  Output 775 ml  Net -415 ml   Last 3 Weights 10/08/2020 02/04/2018 11/07/2012  Weight (lbs) 267 lb 6.4 oz 287 lb 278 lb  Weight (kg) 121.292 kg 130.182 kg 126.1 kg      Telemetry    NSR with frequent PVCs and ventricular couplets - Personally Reviewed  ECG    No new EKG to review - Personally Reviewed  Physical Exam   GEN: No acute distress.   Neck: No JVD Cardiac: RRR, no murmurs, rubs, or gallops.  Respiratory: Clear to auscultation bilaterally. GI: Soft, nontender, non-distended  MS: No edema; No deformity. Neuro:  Nonfocal  Psych: Normal affect   Labs    High Sensitivity Troponin:   Recent Labs  Lab 10/07/20 1641 10/07/20 1711  TROPONINIHS 12 9      Chemistry Recent Labs  Lab 10/07/20 1641 10/08/20 0211 10/09/20 0243  NA 137 138  140  K 4.6 4.3 5.1  CL 108 109 104  CO2 22 23 27   GLUCOSE 115* 158* 80  BUN 30* 27* 23  CREATININE 1.73* 1.59* 1.63*  CALCIUM 8.9 8.9 9.2  PROT 6.9  --   --   ALBUMIN 3.6  --   --   AST 16  --   --   ALT 10  --   --   ALKPHOS 87  --   --   BILITOT 0.7  --   --   GFRNONAA 40* 44* 43*  ANIONGAP 7 6 9      Hematology Recent Labs  Lab 10/07/20 1641  WBC 9.1  RBC 4.65  HGB 13.8  HCT 42.5  MCV 91.4  MCH 29.7  MCHC 32.5  RDW 13.5  PLT 211    BNP Recent Labs  Lab 10/07/20 1641  BNP 184.5*     DDimer No results for input(s): DDIMER in the last 168 hours.   CHA2DS2-VASc Score =    This indicates a  % annual risk of stroke. The patient's score is based upon:        Radiology    DG Chest 1 View  Result Date: 10/07/2020 CLINICAL DATA:  Near syncope, weakness EXAM: CHEST  1 VIEW COMPARISON:  12/26/2015 FINDINGS: Stable cardiomediastinal contours. No focal airspace consolidation, pleural effusion, or pneumothorax. Degenerative changes of the shoulders. IMPRESSION: No active disease. Electronically Signed   By: Duanne Guess D.O.   On: 10/07/2020 15:45   ECHOCARDIOGRAM COMPLETE  Result Date: 10/08/2020    ECHOCARDIOGRAM REPORT   Patient Name:   Karl Peters Date of Exam: 10/08/2020 Medical Rec #:  [id number]     Height:       68.0 in Accession #:    [id number]    Weight:       287.0 lb Date of Birth:  [birth year]    BSA:          2.383 m Patient Age:    80 years      BP:           141/72 mmHg Patient Gender: M             HR:           81 bpm. Exam Location:  Inpatient Procedure: 2D Echo, Cardiac Doppler, Color Doppler and Intracardiac            Opacification Agent Indications:    Abnormal ECG R94.31  History:        Patient has prior history of Echocardiogram examinations, most                 recent 03/19/2016. 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  Sonographer:    Leta Jungling RDCS Referring Phys: [phone] JARED M GARDNER  Sonographer Comments: Technically difficult study due to poor echo windows. IMPRESSIONS  1. Left ventricular ejection fraction, by estimation, is 40 to 45% with significant beat to beat variability. The left ventricle has mildly decreased function. The left ventricle has no regional wall motion abnormalities. Left ventricular diastolic parameters are indeterminate.  2. Right ventricular systolic function is normal. The right ventricular size is normal. There is normal pulmonary artery systolic pressure. The estimated right ventricular systolic pressure is 34.8 mmHg.  3. The mitral valve is grossly normal. Trivial mitral valve regurgitation. No evidence of mitral stenosis.  4. The aortic valve is grossly normal. Aortic valve regurgitation is trivial. No aortic stenosis is present.  5. The inferior vena cava is normal in size with <50% respiratory variability, suggesting right atrial pressure of 8 mmHg. FINDINGS  Left Ventricle: Left ventricular ejection fraction, by estimation, is 40 to 45%. The left ventricle has mildly decreased function. The left ventricle has no regional wall motion abnormalities. Definity contrast agent was given IV to delineate the left ventricular endocardial borders. The left ventricular internal cavity size was normal in size. There is no left ventricular hypertrophy. Left ventricular diastolic parameters are indeterminate. Right Ventricle: The right ventricular size is normal. Right vetricular wall thickness was not well visualized. Right ventricular systolic function is normal. There is normal pulmonary artery systolic pressure. The tricuspid regurgitant velocity is 2.59 m/s, and with an assumed right atrial pressure of 8 mmHg, the estimated right ventricular systolic pressure is 34.8 mmHg. Left Atrium: Left atrial size was not well visualized. Right Atrium: Right  atrial size was not well visualized. Pericardium: There is no evidence  of pericardial effusion. Mitral Valve: The mitral valve is grossly normal. Trivial mitral valve regurgitation. No evidence of mitral valve stenosis. Tricuspid Valve: The tricuspid valve is normal in structure. Tricuspid valve regurgitation is trivial. No evidence of tricuspid stenosis. Aortic Valve: The aortic valve is grossly normal. Aortic valve regurgitation is trivial. No aortic stenosis is present. Pulmonic Valve: The pulmonic valve was grossly normal. Pulmonic valve regurgitation is trivial. No evidence of pulmonic stenosis. Aorta: The aortic root is normal in size and structure. Venous: The inferior vena cava is normal in size with less than 50% respiratory variability, suggesting right atrial pressure of 8 mmHg. IAS/Shunts: The atrial septum is grossly normal.  LEFT VENTRICLE PLAX 2D LVIDd:         5.70 cm      Diastology LVIDs:         4.40 cm      LV e' medial:    5.88 cm/s LV PW:         0.80 cm      LV E/e' medial:  11.4 LV IVS:        0.80 cm      LV e' lateral:   7.45 cm/s LVOT diam:     1.80 cm      LV E/e' lateral: 9.0 LV SV:         49 LV SV Index:   20 LVOT Area:     2.54 cm  LV Volumes (MOD) LV vol d, MOD A2C: 171.0 ml LV vol d, MOD A4C: 173.0 ml LV vol s, MOD A2C: 77.6 ml LV vol s, MOD A4C: 103.0 ml LV SV MOD A2C:     93.4 ml LV SV MOD A4C:     173.0 ml LV SV MOD BP:      79.7 ml RIGHT VENTRICLE RV S prime:     13.00 cm/s TAPSE (M-mode): 2.2 cm LEFT ATRIUM             Index LA Vol (A2C):   41.7 ml 17.50 ml/m LA Vol (A4C):   35.0 ml 14.69 ml/m LA Biplane Vol: 38.8 ml 16.28 ml/m  AORTIC VALVE LVOT Vmax:   105.63 cm/s LVOT Vmean:  68.733 cm/s LVOT VTI:    0.192 m  AORTA Ao Root diam: 3.30 cm Ao Asc diam:  3.40 cm MITRAL VALVE               TRICUSPID VALVE MV Area (PHT): 3.88 cm    TR Peak grad:   26.8 mmHg MV Decel Time: 195 msec    TR Vmax:        259.00 cm/s MV E velocity: 67.07 cm/s MV A velocity: 77.43 cm/s  SHUNTS MV  E/A ratio:  0.87        Systemic VTI:  0.19 m                            Systemic Diam: 1.80 cm Weston Brass MD Electronically signed by Weston Brass MD Signature Date/Time: 10/08/2020/1:27:29 PM    Final    US Abdomen Limited RUQ (LIVER/GB)  Result Date: 10/07/2020 CLINICAL DATA:  Right upper quadrant pain for 1 month, nausea and diarrhea EXAM: ULTRASOUND ABDOMEN LIMITED RIGHT UPPER QUADRANT COMPARISON:  None. FINDINGS: Gallbladder: No gallstones or wall thickening visualized. No sonographic Murphy sign noted by sonographer. Common bile duct: Diameter: 4 mm Liver: No focal lesion identified. Within normal limits in parenchymal echogenicity. Portal vein is  patent on color Doppler imaging with normal direction of blood flow towards the liver. Other: None. IMPRESSION: 1. Unremarkable right upper quadrant ultrasound. Electronically Signed   By: Sharlet Salina M.D.   On: 10/07/2020 21:23    Cardiac Studies   2D echo 10/08/20 IMPRESSIONS    1. Left ventricular ejection fraction, by estimation, is 40 to 45% with  significant beat to beat variability. The left ventricle has mildly  decreased function. The left ventricle has no regional wall motion  abnormalities. Left ventricular diastolic  parameters are indeterminate.   2. Right ventricular systolic function is normal. The right ventricular  size is normal. There is normal pulmonary artery systolic pressure. The  estimated right ventricular systolic pressure is 34.8 mmHg.   3. The mitral valve is grossly normal. Trivial mitral valve  regurgitation. No evidence of mitral stenosis.   4. The aortic valve is grossly normal. Aortic valve regurgitation is  trivial. No aortic stenosis is present.   5. The inferior vena cava is normal in size with <50% respiratory  variability, suggesting right atrial pressure of 8 mmHg.   FINDINGS   Left Ventricle: Left ventricular ejection fraction, by estimation, is 40  to 45%. The left ventricle has mildly  decreased function. The left  ventricle has no regional wall motion abnormalities. Definity contrast  agent was given IV to delineate the left  ventricular endocardial borders. The left ventricular internal cavity size  was normal in size. There is no left ventricular hypertrophy. Left  ventricular diastolic parameters are indeterminate.   Patient Profile     80 y.o. male with a hx of diabetes, hypertension, hyperlipidemia who is being seen 10/07/2020 for the evaluation of PVCs at the request of Dr. Marjie Skiff.  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   -He does not have any electrolyte abnormalities at this time.   -TSH pending -He has had multiple ischemic evaluations that have been unremarkable and he has no ischemic symptoms at this time.   -2D echo shows reduced LVF with EF 40-45%>this may be underestimated in the setting of frequent ventricular ectopy but another concern is that he has devloped a cardiomyopathy due to frequency of ventricular ectopy. -He has only been taking his carvedilol once per day which may be contributing.   -change Carvedilol to Lopressor 25mg  BID which will allow for better suppression of PVCs without as much BP lowering effect -will make NPO for nuclear stress test tomorrow to rule out ischemia -will ask EP to see in am  Elevated BNP/AKI:  -BNP is mildly elevated.   -He appears euvolemic.   -We will not diurese as he has acute renal failure and respiratory status is stable..   -echo now with reduced LVF -no ACEI/ARB/ARNi in setting of AKI   Hyperlipidemia:  -Lipids are poorly controlled.  LDL was 132 on 09/2020.  ' -Given that he has diabetes it should be at least less than 100.   --rosuvastatin increased to 40 mg.   -Repeat lipids and a CMP in 2 to 3 months.      For questions or updates,  please contact [facility] Please consult [email or web address] for contact info under        Signed, Armanda Magic, MD  10/09/2020, 7:23 AM

## 2020-10-09 NOTE — Progress Notes (Signed)
Progress Note  Patient Name: Karl Peters Date of Encounter: 10/09/2020  [facility] Cardiologist: None   Subjective   Denies any chest pain or SOB.  Continues to have frequent ventricular arrhythmias on tele  Inpatient Medications    Scheduled Meds:  buPROPion  300 mg Oral q morning   heparin  5,000 Units Subcutaneous Q8H   insulin aspart  0-15 Units Subcutaneous TID WC   insulin aspart  0-5 Units Subcutaneous QHS   metoprolol tartrate  25 mg Oral BID   montelukast  10 mg Oral q morning   pantoprazole  40 mg Oral q morning   rosuvastatin  40 mg Oral q morning   vitamin B-12  1,000 mcg Oral q morning   Continuous Infusions:  PRN Meds: acetaminophen **OR** acetaminophen, ondansetron **OR** ondansetron (ZOFRAN) IV   Vital Signs    Vitals:   10/08/20 1745 10/08/20 1954 10/09/20 0139 10/09/20 0515  BP:  (!) 148/70 104/83 122/62  Pulse:  86 65 73  Resp:  20 16 16   Temp:  98.6 F (37 C) 98 F (36.7 C) 98.2 F (36.8 C)  TempSrc:  Oral Oral Oral  SpO2:  100% 95% 93%  Weight: 121.3 kg     Height:        Intake/Output Summary (Last 24 hours) at 10/09/2020 0834 Last data filed at 10/09/2020 0543 Gross per 24 hour  Intake 360 ml  Output 775 ml  Net -415 ml    Last 3 Weights 10/08/2020 02/04/2018 11/07/2012  Weight (lbs) 267 lb 6.4 oz 287 lb 278 lb  Weight (kg) 121.292 kg 130.182 kg 126.1 kg      Telemetry    NSR with frequent PVCs and ventricular couplets Personally Reviewed  ECG    No new EKG to review - Personally Reviewed  Physical Exam   GEN: Well nourished, well developed in no acute distress HEENT: Normal NECK: No JVD; No carotid bruits LYMPHATICS: No lymphadenopathy CARDIAC:RRR, no murmurs, rubs, gallops.  Frequent ectopy RESPIRATORY:  Clear to auscultation without rales, wheezing or rhonchi  ABDOMEN: Soft, non-tender, non-distended MUSCULOSKELETAL:  No edema; No deformity  SKIN: Warm and dry NEUROLOGIC:  Alert and oriented x 3 PSYCHIATRIC:   Normal affect    Labs    High Sensitivity Troponin:   Recent Labs  Lab 10/07/20 1641 10/07/20 1711  TROPONINIHS 12 9       Chemistry Recent Labs  Lab 10/07/20 1641 10/08/20 0211 10/09/20 0243  NA 137 138 140  K 4.6 4.3 5.1  CL 108 109 104  CO2 22 23 27   GLUCOSE 115* 158* 80  BUN 30* 27* 23  CREATININE 1.73* 1.59* 1.63*  CALCIUM 8.9 8.9 9.2  PROT 6.9  --   --   ALBUMIN 3.6  --   --   AST 16  --   --   ALT 10  --   --   ALKPHOS 87  --   --   BILITOT 0.7  --   --   GFRNONAA 40* 44* 43*  ANIONGAP 7 6 9       Hematology Recent Labs  Lab 10/07/20 1641  WBC 9.1  RBC 4.65  HGB 13.8  HCT 42.5  MCV 91.4  MCH 29.7  MCHC 32.5  RDW 13.5  PLT 211     BNP Recent Labs  Lab 10/07/20 1641  BNP 184.5*      DDimer No results for input(s): DDIMER in the last 168 hours.   CHA2DS2-VASc  Score =    This indicates a  % annual risk of stroke. The patient's score is based upon:        Radiology    DG Chest 1 View  Result Date: 10/07/2020 CLINICAL DATA:  Near syncope, weakness EXAM: CHEST  1 VIEW COMPARISON:  12/26/2015 FINDINGS: Stable cardiomediastinal contours. No focal airspace consolidation, pleural effusion, or pneumothorax. Degenerative changes of the shoulders. IMPRESSION: No active disease. Electronically Signed   By: Duanne Guess D.O.   On: 10/07/2020 15:45   ECHOCARDIOGRAM COMPLETE  Result Date: 10/08/2020    ECHOCARDIOGRAM REPORT   Patient Name:   Karl Peters Date of Exam: 10/08/2020 Medical Rec #:  [id number]     Height:       68.0 in Accession #:    [id number]    Weight:       287.0 lb Date of Birth:  01/22/1941    BSA:          2.383 m Patient Age:    79 years      BP:           141/72 mmHg Patient Gender: M             HR:           81 bpm. Exam Location:  Inpatient Procedure: 2D Echo, Cardiac Doppler, Color Doppler and Intracardiac            Opacification Agent Indications:    Abnormal ECG R94.31  History:        Patient has prior history of  Echocardiogram examinations, most                 recent 03/19/2016. Arrythmias:RBBB, PVC, Bradycardia and                 Ventricular tachycardia. First-degree AV block. Trifascicular                 block; Risk Factors:Hypertension, Diabetes and Dyslipidemia.  Sonographer:    Leta Jungling RDCS Referring Phys: [phone] JARED M GARDNER  Sonographer Comments: Technically difficult study due to poor echo windows. IMPRESSIONS  1. Left ventricular ejection fraction, by estimation, is 40 to 45% with significant beat to beat variability. The left ventricle has mildly decreased function. The left ventricle has no regional wall motion abnormalities. Left ventricular diastolic parameters are indeterminate.  2. Right ventricular systolic function is normal. The right ventricular size is normal. There is normal pulmonary artery systolic pressure. The estimated right ventricular systolic pressure is 34.8 mmHg.  3. The mitral valve is grossly normal. Trivial mitral valve regurgitation. No evidence of mitral stenosis.  4. The aortic valve is grossly normal. Aortic valve regurgitation is trivial. No aortic stenosis is present.  5. The inferior vena cava is normal in size with <50% respiratory variability, suggesting right atrial pressure of 8 mmHg. FINDINGS  Left Ventricle: Left ventricular ejection fraction, by estimation, is 40 to 45%. The left ventricle has mildly decreased function. The left ventricle has no regional wall motion abnormalities. Definity contrast agent was given IV to delineate the left ventricular endocardial borders. The left ventricular internal cavity size was normal in size. There is no left ventricular hypertrophy. Left ventricular diastolic parameters are indeterminate. Right Ventricle: The right ventricular size is normal. Right vetricular wall thickness was not well visualized. Right ventricular systolic function is normal. There is normal pulmonary artery systolic pressure. The tricuspid regurgitant  velocity is 2.59 m/s, and with an assumed right atrial  pressure of 8 mmHg, the estimated right ventricular systolic pressure is 34.8 mmHg. Left Atrium: Left atrial size was not well visualized. Right Atrium: Right atrial size was not well visualized. Pericardium: There is no evidence of pericardial effusion. Mitral Valve: The mitral valve is grossly normal. Trivial mitral valve regurgitation. No evidence of mitral valve stenosis. Tricuspid Valve: The tricuspid valve is normal in structure. Tricuspid valve regurgitation is trivial. No evidence of tricuspid stenosis. Aortic Valve: The aortic valve is grossly normal. Aortic valve regurgitation is trivial. No aortic stenosis is present. Pulmonic Valve: The pulmonic valve was grossly normal. Pulmonic valve regurgitation is trivial. No evidence of pulmonic stenosis. Aorta: The aortic root is normal in size and structure. Venous: The inferior vena cava is normal in size with less than 50% respiratory variability, suggesting right atrial pressure of 8 mmHg. IAS/Shunts: The atrial septum is grossly normal.  LEFT VENTRICLE PLAX 2D LVIDd:         5.70 cm      Diastology LVIDs:         4.40 cm      LV e' medial:    5.88 cm/s LV PW:         0.80 cm      LV E/e' medial:  11.4 LV IVS:        0.80 cm      LV e' lateral:   7.45 cm/s LVOT diam:     1.80 cm      LV E/e' lateral: 9.0 LV SV:         49 LV SV Index:   20 LVOT Area:     2.54 cm  LV Volumes (MOD) LV vol d, MOD A2C: 171.0 ml LV vol d, MOD A4C: 173.0 ml LV vol s, MOD A2C: 77.6 ml LV vol s, MOD A4C: 103.0 ml LV SV MOD A2C:     93.4 ml LV SV MOD A4C:     173.0 ml LV SV MOD BP:      79.7 ml RIGHT VENTRICLE RV S prime:     13.00 cm/s TAPSE (M-mode): 2.2 cm LEFT ATRIUM             Index LA Vol (A2C):   41.7 ml 17.50 ml/m LA Vol (A4C):   35.0 ml 14.69 ml/m LA Biplane Vol: 38.8 ml 16.28 ml/m  AORTIC VALVE LVOT Vmax:   105.63 cm/s LVOT Vmean:  68.733 cm/s LVOT VTI:    0.192 m  AORTA Ao Root diam: 3.30 cm Ao Asc diam:  3.40 cm  MITRAL VALVE               TRICUSPID VALVE MV Area (PHT): 3.88 cm    TR Peak grad:   26.8 mmHg MV Decel Time: 195 msec    TR Vmax:        259.00 cm/s MV E velocity: 67.07 cm/s MV A velocity: 77.43 cm/s  SHUNTS MV E/A ratio:  0.87        Systemic VTI:  0.19 m                            Systemic Diam: 1.80 cm Weston Brass MD Electronically signed by Weston Brass MD Signature Date/Time: 10/08/2020/1:27:29 PM    Final    US Abdomen Limited RUQ (LIVER/GB)  Result Date: 10/07/2020 CLINICAL DATA:  Right upper quadrant pain for 1 month, nausea and diarrhea EXAM: ULTRASOUND ABDOMEN LIMITED RIGHT UPPER QUADRANT COMPARISON:  None. FINDINGS: Gallbladder: No gallstones or wall thickening visualized. No sonographic Murphy sign noted by sonographer. Common bile duct: Diameter: 4 mm Liver: No focal lesion identified. Within normal limits in parenchymal echogenicity. Portal vein is patent on color Doppler imaging with normal direction of blood flow towards the liver. Other: None. IMPRESSION: 1. Unremarkable right upper quadrant ultrasound. Electronically Signed   By: Sharlet Salina M.D.   On: 10/07/2020 21:23    Cardiac Studies   2D echo 10/08/20 IMPRESSIONS    1. Left ventricular ejection fraction, by estimation, is 40 to 45% with  significant beat to beat variability. The left ventricle has mildly  decreased function. The left ventricle has no regional wall motion  abnormalities. Left ventricular diastolic  parameters are indeterminate.   2. Right ventricular systolic function is normal. The right ventricular  size is normal. There is normal pulmonary artery systolic pressure. The  estimated right ventricular systolic pressure is 34.8 mmHg.   3. The mitral valve is grossly normal. Trivial mitral valve  regurgitation. No evidence of mitral stenosis.   4. The aortic valve is grossly normal. Aortic valve regurgitation is  trivial. No aortic stenosis is present.   5. The inferior vena cava is normal in size  with <50% respiratory  variability, suggesting right atrial pressure of 8 mmHg.   FINDINGS   Left Ventricle: Left ventricular ejection fraction, by estimation, is 40  to 45%. The left ventricle has mildly decreased function. The left  ventricle has no regional wall motion abnormalities. Definity contrast  agent was given IV to delineate the left  ventricular endocardial borders. The left ventricular internal cavity size  was normal in size. There is no left ventricular hypertrophy. Left  ventricular diastolic parameters are indeterminate.   Patient Profile     80 y.o. male with a hx of diabetes, hypertension, hyperlipidemia who is being seen 10/07/2020 for the evaluation of PVCs at the request of Dr. Marjie Skiff.  Assessment & Plan    PVCs/NSVT:  -Mr. Breitkreutz has very frequent PVCs and ventricular couplets.  This is longstanding issue, though his EKGs when last seen in 2014 showed that it was much better controlled.   -He does not have any electrolyte abnormalities at this time.   -TSH pending -He has had multiple ischemic evaluations that have been unremarkable and he has no ischemic symptoms at this time.   -2D echo shows reduced LVF with EF 40-45%>this may be underestimated in the setting of frequent ventricular ectopy but another concern is that he has devloped a cardiomyopathy due to frequency of ventricular ectopy. -He has only been taking his carvedilol once per day which may be contributing.   -will change Carvedilol to Lopressor 25mg  BID which will allow for better suppression of PVCs without as much BP lowering effect -he is NPO for nuclear stress test today to rule out ischemia -will get EP consult for PVCs in setting of new LV dysfunction  Elevated BNP/DCM -BNP is mildly elevated.   -2D echo with new LV dysfunction with EF 40-45% in setting of frequent ventricular ectopy -? Whether frequent PVCs have resulted in a decline in LVF -He appears euvolemic.   -We will not diurese as he  has acute renal failure with worsening SCr and has no evidence of volume overload on exam -no ACEI/ARB/ARNi in setting of AKI -add Hydralazine 10mg  TID for afterload reduction -add Imdur if BP tolerates addition of Hydralazine] -continue BB and consolidate to Toprol at discharge   Hyperlipidemia:  -  Lipids are poorly controlled.  LDL was 132 on 09/2020.  ' -Given that he has diabetes it should be at least less than 100.   -Rosuvastatin increased to 40 mg.   -Repeat lipids and a CMP in 2 to 3 months.      For questions or updates, please contact [facility] Please consult [email or web address] for contact info under        Signed, Armanda Magic, MD  10/09/2020, 8:34 AM

## 2020-10-09 NOTE — Progress Notes (Addendum)
   [employer] today showed large defect with moderate reduction in uptake present in the apical to basal inferior and inferolateral location(s) that is fixed. There is abnormal wall motion in the defect area. Consistent with infarction.   The left ventricular ejection fraction is severely decreased (<30%). Left ventricular function is abnormal. Global function is severely reduced. End diastolic cavity size is moderately enlarged. End systolic cavity size is moderately enlarged. Difficulty in assessment of EF/wall motion due to frequent ectopy. Large area of reduced perfusion at both rest and stress in the inferior and inferolateral wall, from apex to base. Concerning for infarct.   He had an abnormal nuclear stress test at [facility] in 2017 that was concerning for basal inferior and inferoapical ischemia.  He underwent left heart catheterization and had very mild, non-obstructive CAD.  Given his decline in EF (which could be underestimated in the setting of frequent ventricular ectopy), and abnormal nuclear stress test, recommend LHC tomorrow to redefine coronary anatomy and if no significant CAD, then EP consult.    ___________________________________________  Sherron Monday with Mr. Rinks at Dr. Norris Cross request. Reviewed NST results in detail and plans to pursue a LHC tomorrow. The patient understands that risks included but are not limited to stroke (1 in 1000), death (1 in 1000), kidney failure [usually temporary] (1 in 500), bleeding (1 in 200), allergic reaction [possibly serious] (1 in 200).  The patient understands and agrees to proceed.   Will hydrate overnight in anticipation of LHC tomorrow given CKD history.   Keep NPO after MN.  Beatriz Stallion, PA-C 10/09/20; 5:37 PM

## 2020-10-10 ENCOUNTER — Encounter (HOSPITAL_COMMUNITY): Payer: Self-pay | Admitting: Interventional Cardiology

## 2020-10-10 ENCOUNTER — Other Ambulatory Visit: Payer: Self-pay | Admitting: Physician Assistant

## 2020-10-10 ENCOUNTER — Inpatient Hospital Stay (HOSPITAL_COMMUNITY)
Admission: EM | Disposition: A | Discharge status: Home / Self Care | Payer: Self-pay | Source: Home / Self Care | Attending: Family Medicine

## 2020-10-10 DIAGNOSIS — I493 Ventricular premature depolarization: Secondary | ICD-10-CM

## 2020-10-10 DIAGNOSIS — I2510 Atherosclerotic heart disease of native coronary artery without angina pectoris: Secondary | ICD-10-CM | POA: Diagnosis not present

## 2020-10-10 DIAGNOSIS — N179 Acute kidney failure, unspecified: Secondary | ICD-10-CM | POA: Diagnosis not present

## 2020-10-10 DIAGNOSIS — I472 Ventricular tachycardia: Secondary | ICD-10-CM | POA: Diagnosis not present

## 2020-10-10 DIAGNOSIS — R509 Fever, unspecified: Secondary | ICD-10-CM

## 2020-10-10 DIAGNOSIS — I428 Other cardiomyopathies: Secondary | ICD-10-CM

## 2020-10-10 DIAGNOSIS — R9439 Abnormal result of other cardiovascular function study: Secondary | ICD-10-CM | POA: Diagnosis not present

## 2020-10-10 DIAGNOSIS — I10 Essential (primary) hypertension: Secondary | ICD-10-CM | POA: Diagnosis not present

## 2020-10-10 DIAGNOSIS — R0602 Shortness of breath: Secondary | ICD-10-CM

## 2020-10-10 HISTORY — PX: LEFT HEART CATH AND CORONARY ANGIOGRAPHY: CATH118249

## 2020-10-10 LAB — GLUCOSE, CAPILLARY
Glucose-Capillary: 90 mg/dL (ref 70–99)
Glucose-Capillary: 70 mg/dL (ref 70–99)
Glucose-Capillary: 141 mg/dL — ABNORMAL HIGH (ref 70–99)
Glucose-Capillary: 124 mg/dL — ABNORMAL HIGH (ref 70–99)

## 2020-10-10 LAB — CBC WITH DIFFERENTIAL/PLATELET
Abs Immature Granulocytes: 0.04 10*3/uL (ref 0.00–0.07)
Basophils Absolute: 0.1 10*3/uL (ref 0.0–0.1)
Basophils Relative: 1 %
Eosinophils Absolute: 0.1 10*3/uL (ref 0.0–0.5)
Eosinophils Relative: 2 %
HCT: 40.6 % (ref 39.0–52.0)
Hemoglobin: 13.1 g/dL (ref 13.0–17.0)
Immature Granulocytes: 0 %
Lymphocytes Relative: 34 %
Lymphs Abs: 3.2 10*3/uL (ref 0.7–4.0)
MCH: 29.4 pg (ref 26.0–34.0)
MCHC: 32.3 g/dL (ref 30.0–36.0)
MCV: 91.2 fL (ref 80.0–100.0)
Monocytes Absolute: 0.8 10*3/uL (ref 0.1–1.0)
Monocytes Relative: 9 %
Neutro Abs: 5.2 10*3/uL (ref 1.7–7.7)
Neutrophils Relative %: 54 %
Platelets: 219 10*3/uL (ref 150–400)
RBC: 4.45 MIL/uL (ref 4.22–5.81)
RDW: 13.6 % (ref 11.5–15.5)
WBC: 9.4 10*3/uL (ref 4.0–10.5)
nRBC: 0 % (ref 0.0–0.2)

## 2020-10-10 LAB — COMPREHENSIVE METABOLIC PANEL
ALT: 10 U/L (ref 0–44)
AST: 12 U/L — ABNORMAL LOW (ref 15–41)
Albumin: 3.2 g/dL — ABNORMAL LOW (ref 3.5–5.0)
Alkaline Phosphatase: 74 U/L (ref 38–126)
Anion gap: 6 (ref 5–15)
BUN: 21 mg/dL (ref 8–23)
CO2: 26 mmol/L (ref 22–32)
Calcium: 9 mg/dL (ref 8.9–10.3)
Chloride: 107 mmol/L (ref 98–111)
Creatinine, Ser: 1.7 mg/dL — ABNORMAL HIGH (ref 0.61–1.24)
GFR, Estimated: 41 mL/min — ABNORMAL LOW (ref >60)
Glucose, Bld: 77 mg/dL (ref 70–99)
Potassium: 4.9 mmol/L (ref 3.5–5.1)
Sodium: 139 mmol/L (ref 135–145)
Total Bilirubin: 0.5 mg/dL (ref 0.3–1.2)
Total Protein: 6.2 g/dL — ABNORMAL LOW (ref 6.5–8.1)

## 2020-10-10 LAB — URINALYSIS, COMPLETE (UACMP) WITH MICROSCOPIC
Bacteria, UA: NONE SEEN
Bilirubin Urine: NEGATIVE
Glucose, UA: NEGATIVE mg/dL
Hgb urine dipstick: NEGATIVE
Ketones, ur: NEGATIVE mg/dL
Leukocytes,Ua: NEGATIVE
Nitrite: NEGATIVE
Protein, ur: NEGATIVE mg/dL
Specific Gravity, Urine: 1.012 (ref 1.005–1.030)
pH: 5 (ref 5.0–8.0)

## 2020-10-10 LAB — BASIC METABOLIC PANEL
Anion gap: 5 (ref 5–15)
BUN: 20 mg/dL (ref 8–23)
CO2: 25 mmol/L (ref 22–32)
Calcium: 8.6 mg/dL — ABNORMAL LOW (ref 8.9–10.3)
Chloride: 107 mmol/L (ref 98–111)
Creatinine, Ser: 1.49 mg/dL — ABNORMAL HIGH (ref 0.61–1.24)
GFR, Estimated: 48 mL/min — ABNORMAL LOW (ref >60)
Glucose, Bld: 82 mg/dL (ref 70–99)
Potassium: 4.7 mmol/L (ref 3.5–5.1)
Sodium: 137 mmol/L (ref 135–145)

## 2020-10-10 LAB — MAGNESIUM: Magnesium: 1.9 mg/dL (ref 1.7–2.4)

## 2020-10-10 LAB — PHOSPHORUS: Phosphorus: 2.6 mg/dL (ref 2.5–4.6)

## 2020-10-10 SURGERY — LEFT HEART CATH AND CORONARY ANGIOGRAPHY
Anesthesia: LOCAL

## 2020-10-10 MED ORDER — HYDRALAZINE HCL 20 MG/ML IJ SOLN: 10.0000 mg | INTRAMUSCULAR | Status: AC | PRN

## 2020-10-10 MED ORDER — MIDAZOLAM HCL 2 MG/2ML IJ SOLN
INTRAMUSCULAR | Status: DC | PRN
  Administered 2020-10-10: 0.5 mg via INTRAVENOUS
  Administered 2020-10-10: 1 mg via INTRAVENOUS

## 2020-10-10 MED ORDER — VERAPAMIL HCL 2.5 MG/ML IV SOLN
INTRAVENOUS | Status: DC | PRN
  Administered 2020-10-10: 10 mL via INTRA_ARTERIAL

## 2020-10-10 MED ORDER — ASPIRIN 81 MG PO CHEW
81.0000 mg | CHEWABLE_TABLET | Freq: Every day | ORAL | Status: DC
  Administered 2020-10-11 – 2020-10-14 (×4): 81 mg via ORAL
  Filled 2020-10-10 (×5): qty 1

## 2020-10-10 MED ORDER — SODIUM CHLORIDE 0.9 % IV SOLN: INTRAVENOUS | Status: AC

## 2020-10-10 MED ORDER — FENTANYL CITRATE (PF) 100 MCG/2ML IJ SOLN
INTRAMUSCULAR | Status: AC
  Filled 2020-10-10: qty 2

## 2020-10-10 MED ORDER — FENTANYL CITRATE (PF) 100 MCG/2ML IJ SOLN
INTRAMUSCULAR | Status: DC | PRN
  Administered 2020-10-10 (×2): 25 ug via INTRAVENOUS

## 2020-10-10 MED ORDER — DOXYCYCLINE HYCLATE 100 MG PO TABS
100.0000 mg | ORAL_TABLET | Freq: Two times a day (BID) | ORAL | Status: DC
  Administered 2020-10-10: 100 mg via ORAL
  Filled 2020-10-10 (×2): qty 1

## 2020-10-10 MED ORDER — IOHEXOL 350 MG/ML SOLN
INTRAVENOUS | Status: DC | PRN
  Administered 2020-10-10: 60 mL

## 2020-10-10 MED ORDER — VERAPAMIL HCL 2.5 MG/ML IV SOLN
INTRAVENOUS | Status: AC
  Filled 2020-10-10: qty 2

## 2020-10-10 MED ORDER — HEPARIN SODIUM (PORCINE) 1000 UNIT/ML IJ SOLN
INTRAMUSCULAR | Status: AC
  Filled 2020-10-10: qty 1

## 2020-10-10 MED ORDER — HEPARIN (PORCINE) IN NACL 1000-0.9 UT/500ML-% IV SOLN
INTRAVENOUS | Status: AC
  Filled 2020-10-10: qty 500

## 2020-10-10 MED ORDER — LIDOCAINE HCL (PF) 1 % IJ SOLN
INTRAMUSCULAR | Status: AC
  Filled 2020-10-10: qty 30

## 2020-10-10 MED ORDER — HEPARIN SODIUM (PORCINE) 1000 UNIT/ML IJ SOLN
INTRAMUSCULAR | Status: DC | PRN
  Administered 2020-10-10: 6000 [IU] via INTRAVENOUS

## 2020-10-10 MED ORDER — LIDOCAINE HCL (PF) 1 % IJ SOLN
INTRAMUSCULAR | Status: DC | PRN
  Administered 2020-10-10: 2 mL

## 2020-10-10 MED ORDER — DICLOFENAC SODIUM 1 % EX GEL
2.0000 g | Freq: Four times a day (QID) | CUTANEOUS | Status: DC | PRN
  Filled 2020-10-10: qty 100

## 2020-10-10 MED ORDER — HEPARIN SODIUM (PORCINE) 5000 UNIT/ML IJ SOLN
5000.0000 [IU] | Freq: Three times a day (TID) | INTRAMUSCULAR | Status: DC
  Administered 2020-10-11: 5000 [IU] via SUBCUTANEOUS
  Filled 2020-10-10: qty 1

## 2020-10-10 MED ORDER — SODIUM CHLORIDE 0.9 % IV SOLN
250.0000 mL | INTRAVENOUS | Status: DC | PRN
  Administered 2020-10-11: 250 mL via INTRAVENOUS

## 2020-10-10 MED ORDER — SODIUM CHLORIDE 0.9% FLUSH: 3.0000 mL | INTRAVENOUS | Status: DC | PRN

## 2020-10-10 MED ORDER — HYDRALAZINE HCL 10 MG PO TABS
10.0000 mg | ORAL_TABLET | Freq: Three times a day (TID) | ORAL | Status: DC
  Administered 2020-10-10 – 2020-10-14 (×13): 10 mg via ORAL
  Filled 2020-10-10 (×13): qty 1

## 2020-10-10 MED ORDER — ONDANSETRON HCL 4 MG/2ML IJ SOLN: 4.0000 mg | Freq: Four times a day (QID) | INTRAMUSCULAR | Status: DC | PRN

## 2020-10-10 MED ORDER — LABETALOL HCL 5 MG/ML IV SOLN: 10.0000 mg | INTRAVENOUS | Status: AC | PRN

## 2020-10-10 MED ORDER — SODIUM CHLORIDE 0.9% FLUSH
3.0000 mL | Freq: Two times a day (BID) | INTRAVENOUS | Status: DC
  Administered 2020-10-11 – 2020-10-14 (×3): 3 mL via INTRAVENOUS

## 2020-10-10 MED ORDER — METOPROLOL SUCCINATE ER 50 MG PO TB24
50.0000 mg | ORAL_TABLET | Freq: Every day | ORAL | Status: DC
  Administered 2020-10-10 – 2020-10-14 (×5): 50 mg via ORAL
  Filled 2020-10-10 (×6): qty 1

## 2020-10-10 MED ORDER — OXYCODONE HCL 5 MG PO TABS: 5.0000 mg | ORAL_TABLET | ORAL | Status: DC | PRN

## 2020-10-10 MED ORDER — MIDAZOLAM HCL 2 MG/2ML IJ SOLN
INTRAMUSCULAR | Status: AC
  Filled 2020-10-10: qty 2

## 2020-10-10 MED ORDER — ACETAMINOPHEN 325 MG PO TABS
650.0000 mg | ORAL_TABLET | ORAL | Status: DC | PRN
  Administered 2020-10-10 – 2020-10-11 (×2): 650 mg via ORAL
  Filled 2020-10-10 (×2): qty 2

## 2020-10-10 MED ORDER — ISOSORBIDE MONONITRATE ER 30 MG PO TB24
15.0000 mg | ORAL_TABLET | Freq: Every day | ORAL | Status: DC
  Administered 2020-10-10 – 2020-10-14 (×5): 15 mg via ORAL
  Filled 2020-10-10 (×6): qty 1

## 2020-10-10 SURGICAL SUPPLY — 9 items

## 2020-10-10 NOTE — Progress Notes (Addendum)
PROGRESS NOTE    Karl Peters  CZY:[id number] DOB: May 26, 1940 DOA: 10/07/2020 PCP: Elizabeth Palau, FNP   Chief Complaint  Patient presents with   Weakness   Nausea   Brief Narrative:  This 80 years old male with PMH significant for type 2 diabetes, hypertension, hyperlipidemia, prior NSVT and symptomatic PVCs treated with beta-blockers , presented in the ED with generalized weakness.  Patient was started on Jardiance 2 weeks ago which made him feel so bad,  he stopped taking it, then he presented with nausea, vomiting and generalized weakness.  He reports he was working in his garden when he became very tired and had to sit down and developed short of breath but denies any chest pain.  He is found to have slightly elevated serum creatinine from his baseline, EKG shows some abnormalities multiple PVCs.  Cardiology was consulted, recommended to repeat echocardiogram and would not pursue another ischemic evaluation unless there is any change in his systolic function.  Continue Coreg twice.  He's now s/p stress test, area of reduced perfusion concerning for infarct.  Planning for cath 8/25 AM.  Assessment & Plan:   Principal Problem:   AKI (acute kidney injury) (HCC) Active Problems:   DM2 (diabetes mellitus, type 2) (HCC)   Essential hypertension   NSVT (nonsustained ventricular tachycardia) (HCC)   Syncope   Hypotension   Chest pressure   Arrhythmia   SOB (shortness of breath)  Addendum: LUE with painful IV, removed, per RN some discharge.  TTP, will follow upper extremity US, start doxy with purulent drainage.  Warm compress.  Voltaren prn. Suspect infected superficial thrombophlebitis.  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%, mildly decreased function - no RWMA Myoview with large area of reduced perfusion at rest and stress in inferior and inferolateral wall from apex to base, concerning for infarct Patient has multiple PVCs on  EKG S/p LHC without significant obstructive coronary disease in the major arteries -> recommending risk factor modification to prevent progression of mild coronary atherosclerosis -> EP evaluation  Transition to toprol  Imdur added, hydral 10 mg TID EP eval, appreciate recommendations Follow TSH  Acute kidney injury: Presented with serum creatinine of 1.7 increased from his baseline 1.0 Mildly improved to 1.49, follow with IVF UA bland Renal US normal  Continue IVF Holding ARB  Hypertension: Continue Coreg, Norvasc . resume valsartan once renal function improves.   DM2: Hold oral diabetic medications Regular insulin sliding scale   Hyperlipidemia :  continue Crestor (increased to 40)   RUQ pain LFTs normal, ultrasound unremarkable.  DVT prophylaxis: heparin  Code Status: full  Family Communication: none at bedside Disposition:   Status is: Inpatient  Remains inpatient appropriate because:Inpatient level of care appropriate due to severity of illness  Dispo: The patient is from: Home              Anticipated d/c is to: Home              Patient currently is not medically stable to d/c.   Difficult to place patient No       Consultants:  cardiologt  Procedures:  Echo IMPRESSIONS     1. Left ventricular ejection fraction, by estimation, is 40 to 45% with  significant beat to beat variability. The left ventricle has mildly  decreased function. The left ventricle has no regional wall motion  abnormalities. Left ventricular diastolic  parameters are indeterminate.   2. Right ventricular  systolic function is normal. The right ventricular  size is normal. There is normal pulmonary artery systolic pressure. The  estimated right ventricular systolic pressure is 34.8 mmHg.   3. The mitral valve is grossly normal. Trivial mitral valve  regurgitation. No evidence of mitral stenosis.   4. The aortic valve is grossly normal. Aortic valve regurgitation is  trivial.  No aortic stenosis is present.   5. The inferior vena cava is normal in size with <50% respiratory  variability, suggesting right atrial pressure of 8 mmHg.   LHC  No significant obstructive coronary disease in the major arteries.   LAD with segmental 50% mid stenosis.  Second diagonal with 70% ostial to proximal narrowing.  Vessel covers Deaira Leckey very small territory and bifurcates proximally.   Codominant widely patent RCA   30% proximal ramus intermedius.   Codominant right coronary without significant obstruction.   Normal LVEDP   RECOMMENDATIONS: Per treating team EP evaluation is planned. Risk factor modification to prevent progression of mild coronary atherosclerosis Antimicrobials:  Anti-infectives (From admission, onward)    None          Subjective: No new complaints, before cath  Objective: Vitals:   10/10/20 0947 10/10/20 1037 10/10/20 1349 10/10/20 1423  BP: 136/62  136/82 (!) 154/65  Pulse: 70   77  Resp: 16   19  Temp: 98.5 F (36.9 C)  97.6 F (36.4 C) 99.4 F (37.4 C)  TempSrc: Oral  Axillary Oral  SpO2: 98% 100%  96%  Weight:      Height:        Intake/Output Summary (Last 24 hours) at 10/10/2020 1606 Last data filed at 10/10/2020 1351 Gross per 24 hour  Intake 445.79 ml  Output 1600 ml  Net -1154.21 ml   Filed Weights   10/08/20 1745 10/10/20 0639  Weight: 121.3 kg 118.3 kg    Examination:  General: No acute distress. Cardiovascular: RRR Lungs: unlabored Abdomen: Soft, nontender, nondistended  Neurological: Alert and oriented 3. Moves all extremities 4 . Cranial nerves II through XII grossly intact. Skin: Warm and dry. No rashes or lesions. Extremities: No clubbing or cyanosis. No edema.   Data Reviewed: I have personally reviewed following labs and imaging studies  CBC: Recent Labs  Lab 10/07/20 1641 10/10/20 0257  WBC 9.1 9.4  NEUTROABS 6.3 5.2  HGB 13.8 13.1  HCT 42.5 40.6  MCV 91.4 91.2  PLT 211 219    Basic Metabolic  Panel: Recent Labs  Lab 10/07/20 1641 10/08/20 0211 10/09/20 0243 10/10/20 0257 10/10/20 0928  NA 137 138 140 139 137  K 4.6 4.3 5.1 4.9 4.7  CL 108 109 104 107 107  CO2 22 23 27 26 25   GLUCOSE 115* 158* 80 77 82  BUN 30* 27* 23 21 20   CREATININE 1.73* 1.59* 1.63* 1.70* 1.49*  CALCIUM 8.9 8.9 9.2 9.0 8.6*  MG 2.0  --   --  1.9  --   PHOS  --   --   --  2.6  --     GFR: Estimated Creatinine Clearance: 51.1 mL/min (Issacc Merlo) (by C-G formula based on SCr of 1.49 mg/dL (H)).  Liver Function Tests: Recent Labs  Lab 10/07/20 1641 10/10/20 0257  AST 16 12*  ALT 10 10  ALKPHOS 87 74  BILITOT 0.7 0.5  PROT 6.9 6.2*  ALBUMIN 3.6 3.2*    CBG: Recent Labs  Lab 10/09/20 1154 10/09/20 1615 10/09/20 2155 10/10/20 0807 10/10/20 1213  GLUCAP 101* 154*  90 90 70     Recent Results (from the past 240 hour(s))  Resp Panel by RT-PCR (Flu Cherly Erno&B, Covid) Nasopharyngeal Swab     Status: None   Collection Time: 10/07/20  4:47 PM   Specimen: Nasopharyngeal Swab; Nasopharyngeal(NP) swabs in vial transport medium  Result Value Ref Range Status   SARS Coronavirus 2 by RT PCR NEGATIVE NEGATIVE Final    Comment: (NOTE) SARS-CoV-2 target nucleic acids are NOT DETECTED.  The SARS-CoV-2 RNA is generally detectable in upper respiratory specimens during the acute phase of infection. The lowest concentration of SARS-CoV-2 viral copies this assay can detect is 138 copies/mL. Valary Manahan negative result does not preclude SARS-Cov-2 infection and should not be used as the sole basis for treatment or other patient management decisions. Tyrea Froberg negative result may occur with  improper specimen collection/handling, submission of specimen other than nasopharyngeal swab, presence of viral mutation(s) within the areas targeted by this assay, and inadequate number of viral copies(<138 copies/mL). Benjamen Koelling negative result must be combined with clinical observations, patient history, and epidemiological information. The expected  result is Negative.  Fact Sheet for Patients:  BloggerCourse.com  Fact Sheet for Healthcare Providers:  SeriousBroker.it  This test is no t yet approved or cleared by the [place] FDA and  has been authorized for detection and/or diagnosis of SARS-CoV-2 by FDA under an Emergency Use Authorization (EUA). This EUA will remain  in effect (meaning this test can be used) for the duration of the COVID-19 declaration under Section 564(b)(1) of the Act, 21 U.S.C.section 360bbb-3(b)(1), unless the authorization is terminated  or revoked sooner.       Influenza Jeoffrey Eleazer by PCR NEGATIVE NEGATIVE Final   Influenza B by PCR NEGATIVE NEGATIVE Final    Comment: (NOTE) The Xpert Xpress SARS-CoV-2/FLU/RSV plus assay is intended as an aid in the diagnosis of influenza from Nasopharyngeal swab specimens and should not be used as Juliette Standre sole basis for treatment. Nasal washings and aspirates are unacceptable for Xpert Xpress SARS-CoV-2/FLU/RSV testing.  Fact Sheet for Patients: BloggerCourse.com  Fact Sheet for Healthcare Providers: SeriousBroker.it  This test is not yet approved or cleared by the [place] FDA and has been authorized for detection and/or diagnosis of SARS-CoV-2 by FDA under an Emergency Use Authorization (EUA). This EUA will remain in effect (meaning this test can be used) for the duration of the COVID-19 declaration under Section 564(b)(1) of the Act, 21 U.S.C. section 360bbb-3(b)(1), unless the authorization is terminated or revoked.  Performed at [facility], [address] [place], [place] [address]          Radiology Studies: CARDIAC CATHETERIZATION  Result Date: 10/10/2020   No significant obstructive coronary disease in the major arteries.   LAD with segmental 50% mid stenosis.  Second diagonal with 70% ostial to proximal narrowing.  Vessel covers Reaghan Kawa very  small territory and bifurcates proximally.   Codominant widely patent RCA   30% proximal ramus intermedius.   Codominant right coronary without significant obstruction.   Normal LVEDP RECOMMENDATIONS: Per treating team EP evaluation is planned. Risk factor modification to prevent progression of mild coronary atherosclerosis   US RENAL  Result Date: 10/09/2020 CLINICAL DATA:  Acute kidney injury EXAM: RENAL / URINARY TRACT ULTRASOUND COMPLETE COMPARISON:  Ultrasound abdomen 10/07/2020 FINDINGS: Right Kidney: Renal measurements: 10.4 x 5.4 x 4.7 cm = volume: 136 mL. Echogenicity within normal limits. No mass or hydronephrosis visualized. Left Kidney: Renal measurements: 11.1 x 4.6 x 4.7 cm = volume: 125  mL. Echogenicity within normal limits. No mass or hydronephrosis visualized. Bladder: Appears normal for degree of bladder distention. Other: None. IMPRESSION: Normal ultrasound examination of the kidneys.  No hydronephrosis. Electronically Signed   By: Burman Nieves M.D.   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 The study is high risk.   No ST deviation was noted.   LV perfusion is abnormal.   Defect 1: There is Jo Cerone large defect with moderate reduction in uptake present in the apical to basal inferior and inferolateral location(s) that is fixed. There is abnormal wall motion in the defect area. Consistent with infarction.   The left ventricular ejection fraction is severely decreased (<30%). Left ventricular function is abnormal. Global function is severely reduced. End diastolic cavity size is moderately enlarged. End systolic cavity size is moderately enlarged. Difficulty in assessment of EF/wall motion due to frequent ectopy. Large area of reduced perfusion at both rest and stress in the inferior and inferolateral wall, from apex to base. Concerning for infarct. Findings communicated with Dr.  Mayford Knife.        Scheduled Meds:  [START ON 10/11/2020] aspirin  81 mg Oral Daily   buPROPion  300 mg Oral q morning   [START ON 10/11/2020] heparin  5,000 Units Subcutaneous Q8H   hydrALAZINE  10 mg Oral Q8H   insulin aspart  0-15 Units Subcutaneous TID WC   insulin aspart  0-5 Units Subcutaneous QHS   isosorbide mononitrate  15 mg Oral Daily   metoprolol succinate  50 mg Oral Daily   montelukast  10 mg Oral q morning   pantoprazole  40 mg Oral q morning   rosuvastatin  40 mg Oral q morning   sodium chloride flush  3 mL Intravenous Q12H   sodium chloride flush  3 mL Intravenous Q12H   vitamin B-12  1,000 mcg Oral q morning   Continuous Infusions:  sodium chloride 125 mL/hr at 10/10/20 1123   sodium chloride       LOS: 1 day    Time spent: over 30 min    Lacretia Nicks, MD [facility]   To contact the attending provider between 7A-7P or the covering provider during after hours 7P-7A, please log into the web site [email or web address] and access using universal [place] password for that web site. If you do not have the password, please call the hospital operator.  10/10/2020, 4:06 PM

## 2020-10-10 NOTE — Consult Note (Addendum)
Cardiology Consultation:   Patient ID: Karl Peters MRN: [id number]; DOB: November 10, 1940  Admit date: 10/07/2020 Date of Consult: 10/10/2020  PCP:  Karl Palau, FNP   [facility] Providers Cardiologist:  Remoetly Karl Peters (last 2014)     Patient Profile:   Karl Peters is a 80 y.o. male with a hx of PVCs, VT (goes back to about 2010), DM, HTN, hx of vasovagal syncope, who is being seen 10/10/2020 for the evaluation of frequent PVCs and NSVT at the request of Karl Peters.  History of Present Illness:   Karl Peters has seen Karl Peters infrequently over the years, back to 2010 with PVCs, NSVT managed back then with coreg, discussed f/u with Karl Peters. Hx of normal coronaries with cath and preserved LVEF Next interaction with our cardiology team was 2014 with a hospital stay with an acute uptick in palpitations and CP felt to be atypical, noted with NSVT and his coreg (that had been stopped somewhere along the way) was resumed with recommendations to titrate up as able. At his follow up office visit with Karl Peters then, he was doing well, back on coreg, no changes were made. This 2014 visit was his last with [place] until now  Though evaluated at [facility] in 2017 for the same, cathed again after an abnormal stress test with no obstructive CAD  He was admitted 10/07/20, reported recently being started on Jardiance and developed nausea and feeling poorly, he eventually went to an [facility] where his pulse was reportedly 30's and BP soft about 100/42, an EKG done with PVCs, couplets noted. Here labs largely unremarkable with electrolytes wnl, no ischemic symptoms elicited.  He was only taking coreg daily (not BID), with poor oral intake of late felt this and under dosing coreg likely triggering his PVCs  TTE noted LVEF 40-45%, his coreg changed to lopressor for less BP lowering and allow better dosing, not felt to be volume OL. Given change in EF planned for ischemic evaluation. Stress test was markedly  abnormal with large area of scare and LVEF <30) that prompted cath done today with no obstructive CAD   EP is asked on board for his frequent PVCs, NSVT.  LABS 4.6 >>> 4.7 Mag 2.0, 1.9 BUN 30 >>> 20 Creat 1.73 >>> 1.49 (prior creat in 2014 was 1.16) BNP 184 HS Trop 12, 9 WBC 9.1, 9.4 H/H 13/42, 13/40 Plts 211, 219  Home rate/rhythm meds Coreg 12.5mg  Rx BID, though only taking daily I don't find any hx of AAD  Here Lopressor 25mg  BID >> toprol 50mg  today  He tells me that has done quite well since seeing [place] or any cardiology last.  He reports that years ago when he 1st saw Karl Peters he was well aware of his tachycardia, could feel the strong beats in his chest.  He saw the specialist Karl Peters suggested then discussed medicine vs ablation and he feels like he has done quite well with the medicine.  He was only taking the coreg daily it seems inadvertently, for some reason he thought it was once daily. He has not had any awareness of his PCS/tachycardia for years, though also says for many years of he checks his pulse it is always irregular, though again, no longer feels them on his chest or bother him otherwise.  He has noted when he is physically active and he checks his pulse it regularizes.   He has been very active, push mows his yard (3/4 acre), home maintenance and  chores, and feels like until 2 weeks ago he had good exertional capacity. 2 weeks ago he started to feel tired, no energy, felt like if he sat he could nap. The day of his admission he was helping load some bricks, small bricks only about 3lbs each and suddenly felt very weak, clammy and lightheaded.  He went and sat, his wife was there told him he looked pale.  She drove him home and he laid down a bit with a cool cloth and felt better.  Though they decided to go get checked.  Since here he has not had that again.  He has a VERY long hx of vasovagal syncope and symptoms specifically associated with low abdominal  cramping and BM that happen here and there, otherwise no other syncope.    Past Medical History:  Diagnosis Date   Diabetes mellitus    Dyslipidemia    Hx of cardiac catheterization    a. normal cors by cath in 1998 and 2010   Hx of echocardiogram    Echocardiogram 06/10/12: Normal LV wall thickness, EF 50-55%, grade 1 diastolic dysfunction, mild LAE   Hypertension    Obesity    Ventricular tachycardia (HCC)    and symptomatic PVCs - Rx with beta blocker    Past Surgical History:  Procedure Laterality Date   ESOPHAGOGASTRODUODENOSCOPY  06/18/2003   negative   LEFT HEART CATH  12/27/2015   Procedure: Left Heart Catheterization W Intervention; Surgeon: Karl Constable, MD; Location: [facility] CATH; Service: Cardiology       Home Medications:  Prior to Admission medications   Medication Sig Start Date End Date Taking? Authorizing Provider  amLODipine (NORVASC) 10 MG tablet Take 10 mg by mouth every morning. 07/20/17  Yes [provider]  buPROPion (WELLBUTRIN XL) 300 MG 24 hr tablet Take 300 mg by mouth every morning.   Yes [provider]  carvedilol (COREG) 12.5 MG tablet Take 1 tablet (12.5 mg total) by mouth 2 (two) times daily with a meal. Patient taking differently: Take 12.5 mg by mouth every morning. 06/21/13  Yes Karl Peters, Fayrene Fearing, MD  glimepiride (AMARYL) 4 MG tablet Take 4 mg by mouth every morning. 11/22/17  Yes [provider]  metFORMIN (GLUCOPHAGE) 500 MG tablet Take 1,000 mg by mouth every morning.   Yes [provider]  montelukast (SINGULAIR) 10 MG tablet Take 10 mg by mouth every morning. 05/16/17  Yes [provider]  pantoprazole (PROTONIX) 40 MG tablet Take 40 mg by mouth every morning.   Yes [provider]  rosuvastatin (CRESTOR) 20 MG tablet Take 20 mg by mouth every morning.   Yes [provider]  valsartan (DIOVAN) 320 MG tablet Take 320 mg by mouth every morning.   Yes [provider]   vitamin B-12 (CYANOCOBALAMIN) 1000 MCG tablet Take 1,000 mcg by mouth every morning.   Yes [provider]    Inpatient Medications: Scheduled Meds:  [START ON 10/11/2020] aspirin  81 mg Oral Daily   buPROPion  300 mg Oral q morning   [START ON 10/11/2020] heparin  5,000 Units Subcutaneous Q8H   hydrALAZINE  10 mg Oral Q8H   insulin aspart  0-15 Units Subcutaneous TID WC   insulin aspart  0-5 Units Subcutaneous QHS   isosorbide mononitrate  15 mg Oral Daily   metoprolol succinate  50 mg Oral Daily   montelukast  10 mg Oral q morning   pantoprazole  40 mg Oral q morning   rosuvastatin  40 mg Oral q morning   sodium chloride flush  3 mL Intravenous Q12H   sodium chloride flush  3 mL Intravenous Q12H   vitamin B-12  1,000 mcg Oral q morning   Continuous Infusions:  sodium chloride 125 mL/hr at 10/10/20 1123   sodium chloride     PRN Meds: sodium chloride, acetaminophen, hydrALAZINE, labetalol, ondansetron (ZOFRAN) IV, ondansetron **OR** [DISCONTINUED] ondansetron (ZOFRAN) IV, oxyCODONE, sodium chloride flush  Allergies:    Allergies  Allergen Reactions   Jardiance [Empagliflozin] Nausea Only    Social History:   Social History   Socioeconomic History   Marital status: Single    Spouse name: Not on file   Number of children: Not on file   Years of education: Not on file   Highest education level: Not on file  Occupational History   Not on file  Tobacco Use   Smoking status: Never   Smokeless tobacco: Never  Substance and Sexual Activity   Alcohol use: No   Drug use: Not on file   Sexual activity: Not on file  Other Topics Concern   Not on file  Social History Narrative   Not on file   Social Determinants of Health   Financial Resource Strain: Not on file  Food Insecurity: Not on file  Transportation Needs: Not on file  Physical Activity: Not on file  Stress: Not on file  Social Connections: Not on file  Intimate Partner Violence: Not on file     Family History:   Family History  Problem Relation Age of Onset   Heart attack Father 16       Died at age 27 from heart attack   Heart failure Mother 80       Died from heart failure   Lymphoma Mother    Diabetes Mother    Diabetes Mellitus II Maternal Grandmother    Other Maternal Grandfather        head injury   Prostate cancer Paternal Grandfather    Depression Daughter    Rheum arthritis Daughter    Anxiety disorder Daughter      ROS:  Please see the history of present illness.  All other ROS reviewed and negative.     Physical Exam/Data:   Vitals:   10/09/20 1948 10/10/20 0639 10/10/20 0947 10/10/20 1037  BP: 122/89 130/82 136/62   Pulse: 72 (!) 32 70   Resp: 17 20 16    Temp: 98.4 F (36.9 C) 98.3 F (36.8 C) 98.5 F (36.9 C)   TempSrc: Oral Oral Oral   SpO2: 97% 94% 98% 100%  Weight:  118.3 kg    Height:        Intake/Output Summary (Last 24 hours) at 10/10/2020 1153 Last data filed at 10/10/2020 0651 Gross per 24 hour  Intake 445.79 ml  Output 1275 ml  Net -829.21 ml   Last 3 Weights 10/10/2020 10/08/2020 02/04/2018  Weight (lbs) 260 lb 12.8 oz 267 lb 6.4 oz 287 lb  Weight (kg) 118.298 kg 121.292 kg 130.182 kg     Body mass index is 39.65 kg/m.  General:  Well nourished, well developed, in no acute distress HEENT: normal Lymph: no adenopathy Neck: no JVD Endocrine:  No thryomegaly Vascular: No carotid bruits  Cardiac: Regularly irregular; no murmurs, gallops or rubs Lungs:  CTA b/l, no wheezing, rhonchi or rales  Abd: soft, nontender Ext: no edema Musculoskeletal:  No deformities, BUE and BLE strength normal and equal Skin: warm and dry  Neuro:  CNs 2-12 intact, no focal abnormalities noted Psych:  Normal affect   EKG:  The EKG was personally reviewed and demonstrates:    SR, RBBB, 1st degree Avblock, PR , LAD, PVCs, couplets, 110bpm   Telemetry:  Telemetry was personally reviewed and demonstrates:   SR, very frequent PVCs, couplets,  often in a bigeminal or trigeminal pattern. Burden of 3beat salvos has much improved  Relevant CV Studies:   10/10/20: LHC 30-50% mid LAD with 50 to 70% ostial diagonal #1. Large ramus intermedius/high first OM with proximal 30% narrowing. Normal left main 30% proximal circumflex after the origin of high OM/RI. Codominant RCA.  No obstructive disease. Normal LVEDP.  Ventriculography was not performed because of frequent PVCs which would prevent adequate assessment of EF.          Stress myoview 10/09/20   Findings are consistent with prior myocardial infarction. The study is high risk.   No ST deviation was noted.   LV perfusion is abnormal.   Defect 1: There is a large defect with moderate reduction in uptake present in the apical to basal inferior and inferolateral location(s) that is fixed. There is abnormal wall motion in the defect area. Consistent with infarction.   The left ventricular ejection fraction is severely decreased (<30%). Left ventricular function is abnormal. Global function is severely reduced. End diastolic cavity size is moderately enlarged. End systolic cavity size is moderately enlarged.   Difficulty in assessment of EF/wall motion due to frequent ectopy. Large area of reduced perfusion at both rest and stress in the inferior and inferolateral wall, from apex to base. Concerning for infarct. Findings communicated with Karl Peters.   10/08/20: TTE IMPRESSIONS   1. Left ventricular ejection fraction, by estimation, is 40 to 45% with  significant beat to beat variability. The left ventricle has mildly  decreased function. The left ventricle has no regional wall motion  abnormalities. Left ventricular diastolic  parameters are indeterminate.   2. Right ventricular systolic function is normal. The right ventricular  size is normal. There is normal pulmonary artery systolic pressure. The  estimated right ventricular systolic pressure is 34.8 mmHg.   3. The mitral  valve is grossly normal. Trivial mitral valve  regurgitation. No evidence of mitral stenosis.   4. The aortic valve is grossly normal. Aortic valve regurgitation is  trivial. No aortic stenosis is present.   5. The inferior vena cava is normal in size with <50% respiratory  variability, suggesting right atrial pressure of 8 mmHg.    2014; TTE, LVEF 50-55%  Laboratory Data:  High Sensitivity Troponin:   Recent Labs  Lab 10/07/20 1641 10/07/20 1711  TROPONINIHS 12 9     Chemistry Recent Labs  Lab 10/09/20 0243 10/10/20 0257 10/10/20 0928  NA 140 139 137  K 5.1 4.9 4.7  CL 104 107 107  CO2 27 26 25   GLUCOSE 80 77 82  BUN 23 21 20   CREATININE 1.63* 1.70* 1.49*  CALCIUM 9.2 9.0 8.6*  GFRNONAA 43* 41* 48*  ANIONGAP 9 6 5     Recent Labs  Lab 10/07/20 1641 10/10/20 0257  PROT 6.9 6.2*  ALBUMIN 3.6 3.2*  AST 16 12*  ALT 10 10  ALKPHOS 87 74  BILITOT 0.7 0.5   Hematology Recent Labs  Lab 10/07/20 1641 10/10/20 0257  WBC 9.1 9.4  RBC 4.65 4.45  HGB 13.8 13.1  HCT 42.5 40.6  MCV 91.4 91.2  MCH 29.7 29.4  MCHC 32.5 32.3  RDW 13.5  13.6  PLT 211 219   BNP Recent Labs  Lab 10/07/20 1641  BNP 184.5*    DDimer No results for input(s): DDIMER in the last 168 hours.   Radiology/Studies:  DG Chest 1 View Result Date: 10/07/2020 CLINICAL DATA:  Near syncope, weakness EXAM: CHEST  1 VIEW COMPARISON:  12/26/2015 FINDINGS: Stable cardiomediastinal contours. No focal airspace consolidation, pleural effusion, or pneumothorax. Degenerative changes of the shoulders. IMPRESSION: No active disease. Electronically Signed   By: Duanne Guess D.O.   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 NICM May benefit from repeat echo and reassessment of LVEF when not having such frequent ectopy Report mentioning beat to beat variability in EF Attending cardiology following, not felt volume OL Starting GDMT, avoiding ACE/RB/ARNi with AKI   3. CAD, though non-obstructive Attending cards adjusting meds, statin No CP   Karl Peters will see later today I have asked when able post cath to get OOB to ambulate with him and see if PVCs/ectopy do in fact get suppressed with increased HRs   Risk Assessment/Risk Scores:    For questions or updates, please contact [facility] Please consult [email or web address] for contact info under    Signed, Sheilah Pigeon, PA-C  10/10/2020 11:53 AM   I have seen, examined the patient, and reviewed the above assessment and plan.  Changes to above are made where necessary.  On exam, RRR with ectopy.  The patient has chronic PVCs, short NSVT (since 1993).  He is minimally symptomatic.  I would not advise any changes at this time.  He presented with weakness, likely due to dehydration secondary to Jardiance.  Resume beta blocker Gentle hydration No further EP workup planned.  Electrophysiology team to see as needed while here. Please call with questions.   Co Sign: [name], MD 10/10/2020 4:08 PM

## 2020-10-10 NOTE — Progress Notes (Signed)
Cardiac Cath from today reviewed:  Conclusion      No significant obstructive coronary disease in the major arteries.   LAD with segmental 50% mid stenosis.  Second diagonal with 70% ostial to proximal narrowing.  Vessel covers a very small territory and bifurcates proximally.   Codominant widely patent RCA   30% proximal ramus intermedius.   Codominant right coronary without significant obstruction.   Normal LVEDP   RECOMMENDATIONS: Per treating team EP evaluation is planned. Risk factor modification to prevent progression of mild coronary atherosclerosis  EP consult reviewed.  They feel patient is asymptomatic from his PVCs.  Recommend placing back on prior recommended beta blocker dose.  Will treat cardiomyopathy with GDMT and then will repeat 2D echo in 2 months to see if LVF has improved. Consider 2 week zio on BB to reevaluate PVC load.   [facility] will sign off.   Medication Recommendations:  ASA 81mg  daily, Hydralazine 10mg  TID, Imdur 15mg  daily, Toprol XL 50mg  daily, Crestor 40mg  daily.  No diuretics due to AKI and normal LVEDP.   Other recommendations (labs, testing, etc):  BMET in 1 week, 2 week Ziopatch for PVC load Follow up as an outpatient:  Dr. in 4 weeks

## 2020-10-10 NOTE — CV Procedure (Signed)
30-50% mid LAD with 50 to 70% ostial diagonal #1. Large ramus intermedius/high first OM with proximal 30% narrowing. Normal left main 30% proximal circumflex after the origin of high OM/RI. Codominant RCA.  No obstructive disease. Normal LVEDP.  Ventriculography was not performed because of frequent PVCs which would prevent adequate assessment of EF.

## 2020-10-10 NOTE — Progress Notes (Signed)
Left AC IV site leaking this AM.  RN attempted to change dressing, drainage/pus noted.  IV removed.  Site cleaned with CHG.  Dressing applied.   At 1700 patient complained of pain at left [facility] IV site.  Dressing removed, drainage noted.  Dr.  Lowell Guitar notified.  MD stated he would see patient and place new orders.

## 2020-10-10 NOTE — Progress Notes (Signed)
Progress Note  Patient Name: Karl Peters Date of Encounter: 10/10/2020  [facility] Cardiologist: None   Subjective   NO chest pain or SOB.  Continues to have frequent ventricular ectopy on tele.  Plan for cath today but SCr bumped at 2am.   Inpatient Medications    Scheduled Meds:  buPROPion  300 mg Oral q morning   heparin  5,000 Units Subcutaneous Q8H   insulin aspart  0-15 Units Subcutaneous TID WC   insulin aspart  0-5 Units Subcutaneous QHS   metoprolol tartrate  25 mg Oral BID   montelukast  10 mg Oral q morning   pantoprazole  40 mg Oral q morning   rosuvastatin  40 mg Oral q morning   sodium chloride flush  3 mL Intravenous Q12H   vitamin B-12  1,000 mcg Oral q morning   Continuous Infusions:  sodium chloride     sodium chloride 75 mL/hr at 10/10/20 0707   PRN Meds: sodium chloride, acetaminophen **OR** acetaminophen, ondansetron **OR** ondansetron (ZOFRAN) IV, sodium chloride flush   Vital Signs    Vitals:   10/09/20 1333 10/09/20 1527 10/09/20 1948 10/10/20 0639  BP: 104/80 118/68 122/89 130/82  Pulse: 70 72 72 (!) 32  Resp:  (!) 21 17 20   Temp:  98.2 F (36.8 C) 98.4 F (36.9 C) 98.3 F (36.8 C)  TempSrc:  Oral Oral Oral  SpO2: 97% 95% 97% 94%  Weight:    118.3 kg  Height:        Intake/Output Summary (Last 24 hours) at 10/10/2020 0854 Last data filed at 10/10/2020 7829 Gross per 24 hour  Intake 445.79 ml  Output 1275 ml  Net -829.21 ml    Last 3 Weights 10/10/2020 10/08/2020 02/04/2018  Weight (lbs) 260 lb 12.8 oz 267 lb 6.4 oz 287 lb  Weight (kg) 118.298 kg 121.292 kg 130.182 kg      Telemetry    NSR with frequent PVCs Personally Reviewed  ECG  NO new EKG to review- Personally Reviewed  Physical Exam   GEN: Well nourished, well developed in no acute distress HEENT: Normal NECK: No JVD; No carotid bruits LYMPHATICS: No lymphadenopathy CARDIAC:RRR, no murmurs, rubs, gallops frequent ectopy RESPIRATORY:  Clear to auscultation  without rales, wheezing or rhonchi  ABDOMEN: Soft, non-tender, non-distended MUSCULOSKELETAL:  No edema; No deformity  SKIN: Warm and dry NEUROLOGIC:  Alert and oriented x 3 PSYCHIATRIC:  Normal affect    Labs    High Sensitivity Troponin:   Recent Labs  Lab 10/07/20 1641 10/07/20 1711  TROPONINIHS 12 9       Chemistry Recent Labs  Lab 10/07/20 1641 10/08/20 0211 10/09/20 0243 10/10/20 0257  NA 137 138 140 139  K 4.6 4.3 5.1 4.9  CL 108 109 104 107  CO2 22 23 27 26   GLUCOSE 115* 158* 80 77  BUN 30* 27* 23 21  CREATININE 1.73* 1.59* 1.63* 1.70*  CALCIUM 8.9 8.9 9.2 9.0  PROT 6.9  --   --  6.2*  ALBUMIN 3.6  --   --  3.2*  AST 16  --   --  12*  ALT 10  --   --  10  ALKPHOS 87  --   --  74  BILITOT 0.7  --   --  0.5  GFRNONAA 40* 44* 43* 41*  ANIONGAP 7 6 9 6       Hematology Recent Labs  Lab 10/07/20 1641 10/10/20 0257  WBC 9.1 9.4  RBC 4.65 4.45  HGB 13.8 13.1  HCT 42.5 40.6  MCV 91.4 91.2  MCH 29.7 29.4  MCHC 32.5 32.3  RDW 13.5 13.6  PLT 211 219     BNP Recent Labs  Lab 10/07/20 1641  BNP 184.5*      DDimer No results for input(s): DDIMER in the last 168 hours.   CHA2DS2-VASc Score =    This indicates a  % annual risk of stroke. The patient's score is based upon:        Radiology    US RENAL  Result Date: 10/09/2020 CLINICAL DATA:  Acute kidney injury EXAM: RENAL / URINARY TRACT ULTRASOUND COMPLETE COMPARISON:  Ultrasound abdomen 10/07/2020 FINDINGS: Right Kidney: Renal measurements: 10.4 x 5.4 x 4.7 cm = volume: 136 mL. Echogenicity within normal limits. No mass or hydronephrosis visualized. Left Kidney: Renal measurements: 11.1 x 4.6 x 4.7 cm = volume: 125 mL. Echogenicity within normal limits. No mass or hydronephrosis visualized. Bladder: Appears normal for degree of bladder distention. Other: None. IMPRESSION: Normal ultrasound examination of the kidneys.  No hydronephrosis. Electronically Signed   By: Burman Nieves M.D.   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 The study is high risk.   No ST deviation was noted.   LV perfusion is abnormal.   Defect 1: There is a large defect with moderate reduction in uptake present in the apical to basal inferior and inferolateral location(s) that is fixed. There is abnormal wall motion in the defect area. Consistent with infarction.   The left ventricular ejection fraction is severely decreased (<30%). Left ventricular function is abnormal. Global function is severely reduced. End diastolic cavity size is moderately enlarged. End systolic cavity size is moderately enlarged. Difficulty in assessment of EF/wall motion due to frequent ectopy. Large area of reduced perfusion at both rest and stress in the inferior and inferolateral wall, from apex to base. Concerning for infarct. Findings communicated with Dr. Mayford Knife.   ECHOCARDIOGRAM COMPLETE  Result Date: 10/08/2020    ECHOCARDIOGRAM REPORT   Patient Name:   Karl Peters Date of Exam: 10/08/2020 Medical Rec #:  [id number]     Height:       68.0 in Accession #:    [id number]    Weight:       287.0 lb Date of Birth:  1940/06/26    BSA:          2.383 m Patient Age:    79 years      BP:           141/72 mmHg Patient Gender: M             HR:           81 bpm. Exam Location:  Inpatient Procedure: 2D Echo, Cardiac Doppler, Color Doppler and Intracardiac            Opacification Agent Indications:    Abnormal ECG R94.31  History:        Patient has prior history of Echocardiogram examinations, most                 recent 03/19/2016. 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  Sonographer:    Leta Jungling RDCS Referring Phys: [phone] JARED M GARDNER  Sonographer Comments: Technically difficult study  due to poor echo windows. IMPRESSIONS  1. Left ventricular ejection fraction, by estimation, is 40 to 45% with significant beat to beat variability. The left ventricle has mildly decreased function. The left ventricle has no regional wall motion abnormalities. Left ventricular diastolic parameters are indeterminate.  2. Right ventricular systolic function is normal. The right ventricular size is normal. There is normal pulmonary artery systolic pressure. The estimated right ventricular systolic pressure is 34.8 mmHg.  3. The mitral valve is grossly normal. Trivial mitral valve regurgitation. No evidence of mitral stenosis.  4. The aortic valve is grossly normal. Aortic valve regurgitation is trivial. No aortic stenosis is present.  5. The inferior vena cava is normal in size with <50% respiratory variability, suggesting right atrial pressure of 8 mmHg. FINDINGS  Left Ventricle: Left ventricular ejection fraction, by estimation, is 40 to 45%. The left ventricle has mildly decreased function. The left ventricle has no regional wall motion abnormalities. Definity contrast agent was given IV to delineate the left ventricular endocardial borders. The left ventricular internal cavity size was normal in size. There is no left ventricular hypertrophy. Left ventricular diastolic parameters are indeterminate. Right Ventricle: The right ventricular size is normal. Right vetricular wall thickness was not well visualized. Right ventricular systolic function is normal. There is normal pulmonary artery systolic pressure. The tricuspid regurgitant velocity is 2.59 m/s, and with an assumed right atrial pressure of 8 mmHg, the estimated right ventricular systolic pressure is 34.8 mmHg. Left Atrium: Left atrial size was not well visualized. Right Atrium: Right atrial size was not well visualized. Pericardium: There is no evidence of pericardial effusion. Mitral Valve: The mitral valve is grossly normal. Trivial mitral valve  regurgitation. No evidence of mitral valve stenosis. Tricuspid Valve: The tricuspid valve is normal in structure. Tricuspid valve regurgitation is trivial. No evidence of tricuspid stenosis. Aortic Valve: The aortic valve is grossly normal. Aortic valve regurgitation is trivial. No aortic stenosis is present. Pulmonic Valve: The pulmonic valve was grossly normal. Pulmonic valve regurgitation is trivial. No evidence of pulmonic stenosis. Aorta: The aortic root is normal in size and structure. Venous: The inferior vena cava is normal in size with less than 50% respiratory variability, suggesting right atrial pressure of 8 mmHg. IAS/Shunts: The atrial septum is grossly normal.  LEFT VENTRICLE PLAX 2D LVIDd:         5.70 cm      Diastology LVIDs:         4.40 cm      LV e' medial:    5.88 cm/s LV PW:         0.80 cm      LV E/e' medial:  11.4 LV IVS:        0.80 cm      LV e' lateral:   7.45 cm/s LVOT diam:     1.80 cm      LV E/e' lateral: 9.0 LV SV:         49 LV SV Index:   20 LVOT Area:     2.54 cm  LV Volumes (MOD) LV vol d, MOD A2C: 171.0 ml LV vol d, MOD A4C: 173.0 ml LV vol s, MOD A2C: 77.6 ml LV vol s, MOD A4C: 103.0 ml LV SV MOD A2C:     93.4 ml LV SV MOD A4C:  173.0 ml LV SV MOD BP:      79.7 ml RIGHT VENTRICLE RV S prime:     13.00 cm/s TAPSE (M-mode): 2.2 cm LEFT ATRIUM             Index LA Vol (A2C):   41.7 ml 17.50 ml/m LA Vol (A4C):   35.0 ml 14.69 ml/m LA Biplane Vol: 38.8 ml 16.28 ml/m  AORTIC VALVE LVOT Vmax:   105.63 cm/s LVOT Vmean:  68.733 cm/s LVOT VTI:    0.192 m  AORTA Ao Root diam: 3.30 cm Ao Asc diam:  3.40 cm MITRAL VALVE               TRICUSPID VALVE MV Area (PHT): 3.88 cm    TR Peak grad:   26.8 mmHg MV Decel Time: 195 msec    TR Vmax:        259.00 cm/s MV E velocity: 67.07 cm/s MV A velocity: 77.43 cm/s  SHUNTS MV E/A ratio:  0.87        Systemic VTI:  0.19 m                            Systemic Diam: 1.80 cm Weston Brass MD Electronically signed by Weston Brass MD Signature  Date/Time: 10/08/2020/1:27:29 PM    Final     Cardiac Studies   2D echo 10/08/20 IMPRESSIONS    1. Left ventricular ejection fraction, by estimation, is 40 to 45% with  significant beat to beat variability. The left ventricle has mildly  decreased function. The left ventricle has no regional wall motion  abnormalities. Left ventricular diastolic  parameters are indeterminate.   2. Right ventricular systolic function is normal. The right ventricular  size is normal. There is normal pulmonary artery systolic pressure. The  estimated right ventricular systolic pressure is 34.8 mmHg.   3. The mitral valve is grossly normal. Trivial mitral valve  regurgitation. No evidence of mitral stenosis.   4. The aortic valve is grossly normal. Aortic valve regurgitation is  trivial. No aortic stenosis is present.   5. The inferior vena cava is normal in size with <50% respiratory  variability, suggesting right atrial pressure of 8 mmHg.   FINDINGS   Left Ventricle: Left ventricular ejection fraction, by estimation, is 40  to 45%. The left ventricle has mildly decreased function. The left  ventricle has no regional wall motion abnormalities. Definity contrast  agent was given IV to delineate the left  ventricular endocardial borders. The left ventricular internal cavity size  was normal in size. There is no left ventricular hypertrophy. Left  ventricular diastolic parameters are indeterminate.   Patient Profile     80 y.o. male with a hx of diabetes, hypertension, hyperlipidemia who is being seen 10/07/2020 for the evaluation of PVCs at the request of Dr. Marjie Skiff.  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   -He does not have any electrolyte abnormalities at this time.   -TSH pending -He has had multiple ischemic evaluations that have been  unremarkable and he has no ischemic symptoms at this time.   -2D echo shows reduced LVF with EF 40-45%>this may be underestimated in the setting of frequent ventricular ectopy but another concern is that he has devloped a cardiomyopathy due to frequency  of ventricular ectopy. -He has only been taking his carvedilol once per day which may be contributing.   -changed Carvedilol to Lopressor 25mg  BID to allow for better suppression of PVCs without as much BP lowering effect -nuclear stress test showed EF 30% with large area of reduced perfusion in the inferior and inferolateral walls from apex to base concerning for infarct -he is NPO with plan for LHC today>>SCr bumped to 1.7 early this am so will recheck now>>he has been hydrated over night -will get EP consult for PVCs in setting of new LV dysfunction after cath if no CAD  Elevated BNP/DCM -BNP is mildly elevated.   -2D echo with new LV dysfunction with EF 40-45% in setting of frequent ventricular ectopy -? Whether frequent PVCs have resulted in a decline in LVF -He appears euvolemic.   -We will not diurese as he has acute renal failure with worsening SCr and has no evidence of volume overload on exam -no ACEI/ARB/ARNi in setting of AKI -continue Hydralazine 10mg  TID  -add Imdur 15mg  daily for preload reduction -change Toprol XL to 50mg  daily   Hyperlipidemia:  -Lipids are poorly controlled.  LDL was 132 on 09/2020.  ' -Given that he has diabetes it should be at least less than 100.   -Rosuvastatin increased to 40 mg.   -Repeat lipids and a CMP in 2 to 3 months.    I have spent a total of 30 minutes with patient reviewing nuclear stress test, 2D echo , telemetry, EKGs, labs and examining patient as well as establishing an assessment and plan that was discussed with the patient.  > 50% of time was spent in direct patient care.     For questions or updates, please contact [facility] Please consult [email or web address] for contact info under         Signed, Armanda Magic, MD  10/10/2020, 8:54 AM

## 2020-10-10 NOTE — Progress Notes (Addendum)
Due to after hours, staff msg sent to scheduling & monitor team for BMET in 1 week, 2 week ziopatch for PVCs, 2D echo in 2 months and followup with Dr. Johney Frame in 4 weeks per Dr. Norris Cross request - office will mail heart monitor and will call him with appt information. I also outlined this on his AVS.

## 2020-10-10 NOTE — Interval H&P Note (Signed)
Cath Lab Visit (complete for each Cath Lab visit)  Clinical Evaluation Leading to the Procedure:   ACS: No.  Non-ACS:    Anginal Classification: CCS III  Anti-ischemic medical therapy: Minimal Therapy (1 class of medications)  Non-Invasive Test Results: High-risk stress test findings: cardiac mortality >3%/year  Prior CABG: No previous CABG      History and Physical Interval Note:  10/10/2020 10:34 AM  Karl Peters  has presented today for surgery, with the diagnosis of aortic stenosis - heart failure.  The various methods of treatment have been discussed with the patient and family. After consideration of risks, benefits and other options for treatment, the patient has consented to  Procedure(s): LEFT HEART CATH AND CORONARY ANGIOGRAPHY (N/A) as a surgical intervention.  The patient's history has been reviewed, patient examined, no change in status, stable for surgery.  I have reviewed the patient's chart and labs.  Questions were answered to the patient's satisfaction.     Lyn Records III

## 2020-10-10 NOTE — H&P (View-Only) (Signed)
Progress Note  Patient Name: Karl Peters Date of Encounter: 10/10/2020  [facility] Cardiologist: None   Subjective   NO chest pain or SOB.  Continues to have frequent ventricular ectopy on tele.  Plan for cath today but SCr bumped at 2am.   Inpatient Medications    Scheduled Meds:  buPROPion  300 mg Oral q morning   heparin  5,000 Units Subcutaneous Q8H   insulin aspart  0-15 Units Subcutaneous TID WC   insulin aspart  0-5 Units Subcutaneous QHS   metoprolol tartrate  25 mg Oral BID   montelukast  10 mg Oral q morning   pantoprazole  40 mg Oral q morning   rosuvastatin  40 mg Oral q morning   sodium chloride flush  3 mL Intravenous Q12H   vitamin B-12  1,000 mcg Oral q morning   Continuous Infusions:  sodium chloride     sodium chloride 75 mL/hr at 10/10/20 0707   PRN Meds: sodium chloride, acetaminophen **OR** acetaminophen, ondansetron **OR** ondansetron (ZOFRAN) IV, sodium chloride flush   Vital Signs    Vitals:   10/09/20 1333 10/09/20 1527 10/09/20 1948 10/10/20 0639  BP: 104/80 118/68 122/89 130/82  Pulse: 70 72 72 (!) 32  Resp:  (!) 21 17 20   Temp:  98.2 F (36.8 C) 98.4 F (36.9 C) 98.3 F (36.8 C)  TempSrc:  Oral Oral Oral  SpO2: 97% 95% 97% 94%  Weight:    118.3 kg  Height:        Intake/Output Summary (Last 24 hours) at 10/10/2020 0854 Last data filed at 10/10/2020 7829 Gross per 24 hour  Intake 445.79 ml  Output 1275 ml  Net -829.21 ml    Last 3 Weights 10/10/2020 10/08/2020 02/04/2018  Weight (lbs) 260 lb 12.8 oz 267 lb 6.4 oz 287 lb  Weight (kg) 118.298 kg 121.292 kg 130.182 kg      Telemetry    NSR with frequent PVCs Personally Reviewed  ECG  NO new EKG to review- Personally Reviewed  Physical Exam   GEN: Well nourished, well developed in no acute distress HEENT: Normal NECK: No JVD; No carotid bruits LYMPHATICS: No lymphadenopathy CARDIAC:RRR, no murmurs, rubs, gallops frequent ectopy RESPIRATORY:  Clear to auscultation  without rales, wheezing or rhonchi  ABDOMEN: Soft, non-tender, non-distended MUSCULOSKELETAL:  No edema; No deformity  SKIN: Warm and dry NEUROLOGIC:  Alert and oriented x 3 PSYCHIATRIC:  Normal affect    Labs    High Sensitivity Troponin:   Recent Labs  Lab 10/07/20 1641 10/07/20 1711  TROPONINIHS 12 9       Chemistry Recent Labs  Lab 10/07/20 1641 10/08/20 0211 10/09/20 0243 10/10/20 0257  NA 137 138 140 139  K 4.6 4.3 5.1 4.9  CL 108 109 104 107  CO2 22 23 27 26   GLUCOSE 115* 158* 80 77  BUN 30* 27* 23 21  CREATININE 1.73* 1.59* 1.63* 1.70*  CALCIUM 8.9 8.9 9.2 9.0  PROT 6.9  --   --  6.2*  ALBUMIN 3.6  --   --  3.2*  AST 16  --   --  12*  ALT 10  --   --  10  ALKPHOS 87  --   --  74  BILITOT 0.7  --   --  0.5  GFRNONAA 40* 44* 43* 41*  ANIONGAP 7 6 9 6       Hematology Recent Labs  Lab 10/07/20 1641 10/10/20 0257  WBC 9.1 9.4  RBC 4.65 4.45  HGB 13.8 13.1  HCT 42.5 40.6  MCV 91.4 91.2  MCH 29.7 29.4  MCHC 32.5 32.3  RDW 13.5 13.6  PLT 211 219     BNP Recent Labs  Lab 10/07/20 1641  BNP 184.5*      DDimer No results for input(s): DDIMER in the last 168 hours.   CHA2DS2-VASc Score =    This indicates a  % annual risk of stroke. The patient's score is based upon:        Radiology    US RENAL  Result Date: 10/09/2020 CLINICAL DATA:  Acute kidney injury EXAM: RENAL / URINARY TRACT ULTRASOUND COMPLETE COMPARISON:  Ultrasound abdomen 10/07/2020 FINDINGS: Right Kidney: Renal measurements: 10.4 x 5.4 x 4.7 cm = volume: 136 mL. Echogenicity within normal limits. No mass or hydronephrosis visualized. Left Kidney: Renal measurements: 11.1 x 4.6 x 4.7 cm = volume: 125 mL. Echogenicity within normal limits. No mass or hydronephrosis visualized. Bladder: Appears normal for degree of bladder distention. Other: None. IMPRESSION: Normal ultrasound examination of the kidneys.  No hydronephrosis. Electronically Signed   By: Burman Nieves M.D.   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 The study is high risk.   No ST deviation was noted.   LV perfusion is abnormal.   Defect 1: There is a large defect with moderate reduction in uptake present in the apical to basal inferior and inferolateral location(s) that is fixed. There is abnormal wall motion in the defect area. Consistent with infarction.   The left ventricular ejection fraction is severely decreased (<30%). Left ventricular function is abnormal. Global function is severely reduced. End diastolic cavity size is moderately enlarged. End systolic cavity size is moderately enlarged. Difficulty in assessment of EF/wall motion due to frequent ectopy. Large area of reduced perfusion at both rest and stress in the inferior and inferolateral wall, from apex to base. Concerning for infarct. Findings communicated with Dr. Mayford Knife.   ECHOCARDIOGRAM COMPLETE  Result Date: 10/08/2020    ECHOCARDIOGRAM REPORT   Patient Name:   Karl Peters Date of Exam: 10/08/2020 Medical Rec #:  [id number]     Height:       68.0 in Accession #:    [id number]    Weight:       287.0 lb Date of Birth:  1940/06/26    BSA:          2.383 m Patient Age:    79 years      BP:           141/72 mmHg Patient Gender: M             HR:           81 bpm. Exam Location:  Inpatient Procedure: 2D Echo, Cardiac Doppler, Color Doppler and Intracardiac            Opacification Agent Indications:    Abnormal ECG R94.31  History:        Patient has prior history of Echocardiogram examinations, most                 recent 03/19/2016. 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  Sonographer:    Leta Jungling RDCS Referring Phys: [phone] JARED M GARDNER  Sonographer Comments: Technically difficult study  due to poor echo windows. IMPRESSIONS  1. Left ventricular ejection fraction, by estimation, is 40 to 45% with significant beat to beat variability. The left ventricle has mildly decreased function. The left ventricle has no regional wall motion abnormalities. Left ventricular diastolic parameters are indeterminate.  2. Right ventricular systolic function is normal. The right ventricular size is normal. There is normal pulmonary artery systolic pressure. The estimated right ventricular systolic pressure is 34.8 mmHg.  3. The mitral valve is grossly normal. Trivial mitral valve regurgitation. No evidence of mitral stenosis.  4. The aortic valve is grossly normal. Aortic valve regurgitation is trivial. No aortic stenosis is present.  5. The inferior vena cava is normal in size with <50% respiratory variability, suggesting right atrial pressure of 8 mmHg. FINDINGS  Left Ventricle: Left ventricular ejection fraction, by estimation, is 40 to 45%. The left ventricle has mildly decreased function. The left ventricle has no regional wall motion abnormalities. Definity contrast agent was given IV to delineate the left ventricular endocardial borders. The left ventricular internal cavity size was normal in size. There is no left ventricular hypertrophy. Left ventricular diastolic parameters are indeterminate. Right Ventricle: The right ventricular size is normal. Right vetricular wall thickness was not well visualized. Right ventricular systolic function is normal. There is normal pulmonary artery systolic pressure. The tricuspid regurgitant velocity is 2.59 m/s, and with an assumed right atrial pressure of 8 mmHg, the estimated right ventricular systolic pressure is 34.8 mmHg. Left Atrium: Left atrial size was not well visualized. Right Atrium: Right atrial size was not well visualized. Pericardium: There is no evidence of pericardial effusion. Mitral Valve: The mitral valve is grossly normal. Trivial mitral valve  regurgitation. No evidence of mitral valve stenosis. Tricuspid Valve: The tricuspid valve is normal in structure. Tricuspid valve regurgitation is trivial. No evidence of tricuspid stenosis. Aortic Valve: The aortic valve is grossly normal. Aortic valve regurgitation is trivial. No aortic stenosis is present. Pulmonic Valve: The pulmonic valve was grossly normal. Pulmonic valve regurgitation is trivial. No evidence of pulmonic stenosis. Aorta: The aortic root is normal in size and structure. Venous: The inferior vena cava is normal in size with less than 50% respiratory variability, suggesting right atrial pressure of 8 mmHg. IAS/Shunts: The atrial septum is grossly normal.  LEFT VENTRICLE PLAX 2D LVIDd:         5.70 cm      Diastology LVIDs:         4.40 cm      LV e' medial:    5.88 cm/s LV PW:         0.80 cm      LV E/e' medial:  11.4 LV IVS:        0.80 cm      LV e' lateral:   7.45 cm/s LVOT diam:     1.80 cm      LV E/e' lateral: 9.0 LV SV:         49 LV SV Index:   20 LVOT Area:     2.54 cm  LV Volumes (MOD) LV vol d, MOD A2C: 171.0 ml LV vol d, MOD A4C: 173.0 ml LV vol s, MOD A2C: 77.6 ml LV vol s, MOD A4C: 103.0 ml LV SV MOD A2C:     93.4 ml LV SV MOD A4C:  173.0 ml LV SV MOD BP:      79.7 ml RIGHT VENTRICLE RV S prime:     13.00 cm/s TAPSE (M-mode): 2.2 cm LEFT ATRIUM             Index LA Vol (A2C):   41.7 ml 17.50 ml/m LA Vol (A4C):   35.0 ml 14.69 ml/m LA Biplane Vol: 38.8 ml 16.28 ml/m  AORTIC VALVE LVOT Vmax:   105.63 cm/s LVOT Vmean:  68.733 cm/s LVOT VTI:    0.192 m  AORTA Ao Root diam: 3.30 cm Ao Asc diam:  3.40 cm MITRAL VALVE               TRICUSPID VALVE MV Area (PHT): 3.88 cm    TR Peak grad:   26.8 mmHg MV Decel Time: 195 msec    TR Vmax:        259.00 cm/s MV E velocity: 67.07 cm/s MV A velocity: 77.43 cm/s  SHUNTS MV E/A ratio:  0.87        Systemic VTI:  0.19 m                            Systemic Diam: 1.80 cm Weston Brass MD Electronically signed by Weston Brass MD Signature  Date/Time: 10/08/2020/1:27:29 PM    Final     Cardiac Studies   2D echo 10/08/20 IMPRESSIONS    1. Left ventricular ejection fraction, by estimation, is 40 to 45% with  significant beat to beat variability. The left ventricle has mildly  decreased function. The left ventricle has no regional wall motion  abnormalities. Left ventricular diastolic  parameters are indeterminate.   2. Right ventricular systolic function is normal. The right ventricular  size is normal. There is normal pulmonary artery systolic pressure. The  estimated right ventricular systolic pressure is 34.8 mmHg.   3. The mitral valve is grossly normal. Trivial mitral valve  regurgitation. No evidence of mitral stenosis.   4. The aortic valve is grossly normal. Aortic valve regurgitation is  trivial. No aortic stenosis is present.   5. The inferior vena cava is normal in size with <50% respiratory  variability, suggesting right atrial pressure of 8 mmHg.   FINDINGS   Left Ventricle: Left ventricular ejection fraction, by estimation, is 40  to 45%. The left ventricle has mildly decreased function. The left  ventricle has no regional wall motion abnormalities. Definity contrast  agent was given IV to delineate the left  ventricular endocardial borders. The left ventricular internal cavity size  was normal in size. There is no left ventricular hypertrophy. Left  ventricular diastolic parameters are indeterminate.   Patient Profile     80 y.o. male with a hx of diabetes, hypertension, hyperlipidemia who is being seen 10/07/2020 for the evaluation of PVCs at the request of Dr. Marjie Skiff.  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   -He does not have any electrolyte abnormalities at this time.   -TSH pending -He has had multiple ischemic evaluations that have been  unremarkable and he has no ischemic symptoms at this time.   -2D echo shows reduced LVF with EF 40-45%>this may be underestimated in the setting of frequent ventricular ectopy but another concern is that he has devloped a cardiomyopathy due to frequency  of ventricular ectopy. -He has only been taking his carvedilol once per day which may be contributing.   -changed Carvedilol to Lopressor 25mg  BID to allow for better suppression of PVCs without as much BP lowering effect -nuclear stress test showed EF 30% with large area of reduced perfusion in the inferior and inferolateral walls from apex to base concerning for infarct -he is NPO with plan for LHC today>>SCr bumped to 1.7 early this am so will recheck now>>he has been hydrated over night -will get EP consult for PVCs in setting of new LV dysfunction after cath if no CAD  Elevated BNP/DCM -BNP is mildly elevated.   -2D echo with new LV dysfunction with EF 40-45% in setting of frequent ventricular ectopy -? Whether frequent PVCs have resulted in a decline in LVF -He appears euvolemic.   -We will not diurese as he has acute renal failure with worsening SCr and has no evidence of volume overload on exam -no ACEI/ARB/ARNi in setting of AKI -continue Hydralazine 10mg  TID  -add Imdur 15mg  daily for preload reduction -change Toprol XL to 50mg  daily   Hyperlipidemia:  -Lipids are poorly controlled.  LDL was 132 on 09/2020.  ' -Given that he has diabetes it should be at least less than 100.   -Rosuvastatin increased to 40 mg.   -Repeat lipids and a CMP in 2 to 3 months.    I have spent a total of 30 minutes with patient reviewing nuclear stress test, 2D echo , telemetry, EKGs, labs and examining patient as well as establishing an assessment and plan that was discussed with the patient.  > 50% of time was spent in direct patient care.     For questions or updates, please contact [facility] Please consult [email or web address] for contact info under         Signed, Armanda Magic, MD  10/10/2020, 8:54 AM

## 2020-10-10 NOTE — Progress Notes (Addendum)
Cross-coverage note:   Patient developed a new fever. No other SIRS criteria present and no new complaint. Plan to obtain blood cultures, continue close monitoring.

## 2020-10-11 ENCOUNTER — Inpatient Hospital Stay (HOSPITAL_COMMUNITY): Payer: Medicare Other

## 2020-10-11 ENCOUNTER — Inpatient Hospital Stay (INDEPENDENT_AMBULATORY_CARE_PROVIDER_SITE_OTHER): Payer: Medicare Other

## 2020-10-11 DIAGNOSIS — R609 Edema, unspecified: Secondary | ICD-10-CM

## 2020-10-11 DIAGNOSIS — M79602 Pain in left arm: Secondary | ICD-10-CM | POA: Diagnosis not present

## 2020-10-11 DIAGNOSIS — L538 Other specified erythematous conditions: Secondary | ICD-10-CM

## 2020-10-11 DIAGNOSIS — N179 Acute kidney failure, unspecified: Secondary | ICD-10-CM | POA: Diagnosis not present

## 2020-10-11 DIAGNOSIS — I493 Ventricular premature depolarization: Secondary | ICD-10-CM

## 2020-10-11 LAB — BLOOD CULTURE ID PANEL (REFLEXED) - BCID2

## 2020-10-11 LAB — CBC WITH DIFFERENTIAL/PLATELET
Abs Immature Granulocytes: 0.07 10*3/uL (ref 0.00–0.07)
Basophils Absolute: 0 10*3/uL (ref 0.0–0.1)
Basophils Relative: 0 %
Eosinophils Absolute: 0 10*3/uL (ref 0.0–0.5)
Eosinophils Relative: 0 %
HCT: 41.1 % (ref 39.0–52.0)
Hemoglobin: 13.3 g/dL (ref 13.0–17.0)
Immature Granulocytes: 1 %
Lymphocytes Relative: 8 %
Lymphs Abs: 1.1 10*3/uL (ref 0.7–4.0)
MCH: 28.9 pg (ref 26.0–34.0)
MCHC: 32.4 g/dL (ref 30.0–36.0)
MCV: 89.3 fL (ref 80.0–100.0)
Monocytes Absolute: 1.2 10*3/uL — ABNORMAL HIGH (ref 0.1–1.0)
Monocytes Relative: 9 %
Neutro Abs: 11 10*3/uL — ABNORMAL HIGH (ref 1.7–7.7)
Neutrophils Relative %: 82 %
Platelets: 196 10*3/uL (ref 150–400)
RBC: 4.6 MIL/uL (ref 4.22–5.81)
RDW: 13.7 % (ref 11.5–15.5)
WBC: 13.3 10*3/uL — ABNORMAL HIGH (ref 4.0–10.5)
nRBC: 0 % (ref 0.0–0.2)

## 2020-10-11 LAB — BASIC METABOLIC PANEL
Anion gap: 8 (ref 5–15)
BUN: 20 mg/dL (ref 8–23)
CO2: 22 mmol/L (ref 22–32)
Calcium: 8.7 mg/dL — ABNORMAL LOW (ref 8.9–10.3)
Chloride: 104 mmol/L (ref 98–111)
Creatinine, Ser: 1.43 mg/dL — ABNORMAL HIGH (ref 0.61–1.24)
GFR, Estimated: 50 mL/min — ABNORMAL LOW (ref >60)
Glucose, Bld: 113 mg/dL — ABNORMAL HIGH (ref 70–99)
Potassium: 4.2 mmol/L (ref 3.5–5.1)
Sodium: 134 mmol/L — ABNORMAL LOW (ref 135–145)

## 2020-10-11 LAB — GLUCOSE, CAPILLARY
Glucose-Capillary: 87 mg/dL (ref 70–99)
Glucose-Capillary: 102 mg/dL — ABNORMAL HIGH (ref 70–99)
Glucose-Capillary: 132 mg/dL — ABNORMAL HIGH (ref 70–99)
Glucose-Capillary: 143 mg/dL — ABNORMAL HIGH (ref 70–99)

## 2020-10-11 LAB — MAGNESIUM: Magnesium: 1.7 mg/dL (ref 1.7–2.4)

## 2020-10-11 LAB — TSH: TSH: 1.236 u[IU]/mL (ref 0.350–4.500)

## 2020-10-11 LAB — PHOSPHORUS: Phosphorus: 2 mg/dL — ABNORMAL LOW (ref 2.5–4.6)

## 2020-10-11 LAB — HEPARIN LEVEL (UNFRACTIONATED): Heparin Unfractionated: 0.24 IU/mL — ABNORMAL LOW (ref 0.30–0.70)

## 2020-10-11 MED ORDER — HEPARIN BOLUS VIA INFUSION
4000.0000 [IU] | Freq: Once | INTRAVENOUS | Status: AC
  Administered 2020-10-11: 4000 [IU] via INTRAVENOUS
  Filled 2020-10-11: qty 4000

## 2020-10-11 MED ORDER — VANCOMYCIN HCL 10 G IV SOLR
2500.0000 mg | INTRAVENOUS | Status: AC
  Administered 2020-10-11: 2500 mg via INTRAVENOUS
  Filled 2020-10-11: qty 2500

## 2020-10-11 MED ORDER — CLINDAMYCIN PHOSPHATE 600 MG/50ML IV SOLN
600.0000 mg | Freq: Three times a day (TID) | INTRAVENOUS | Status: DC
  Administered 2020-10-12 (×2): 600 mg via INTRAVENOUS
  Filled 2020-10-11 (×3): qty 50

## 2020-10-11 MED ORDER — VANCOMYCIN HCL 1250 MG/250ML IV SOLN: 1250.0000 mg | INTRAVENOUS | Status: DC

## 2020-10-11 MED ORDER — SODIUM CHLORIDE 0.9 % IV SOLN
2.0000 g | INTRAVENOUS | Status: DC
  Administered 2020-10-11: 2 g via INTRAVENOUS
  Filled 2020-10-11: qty 20

## 2020-10-11 MED ORDER — PENICILLIN G POTASSIUM 20000000 UNITS IJ SOLR
12.0000 10*6.[IU] | Freq: Two times a day (BID) | INTRAVENOUS | Status: DC
  Administered 2020-10-12 – 2020-10-14 (×4): 12 10*6.[IU] via INTRAVENOUS
  Filled 2020-10-11 (×8): qty 12

## 2020-10-11 MED ORDER — HEPARIN (PORCINE) 25000 UT/250ML-% IV SOLN
1800.0000 [IU]/h | INTRAVENOUS | Status: DC
  Administered 2020-10-11: 1500 [IU]/h via INTRAVENOUS
  Administered 2020-10-12 (×2): 1650 [IU]/h via INTRAVENOUS
  Administered 2020-10-13: 1800 [IU]/h via INTRAVENOUS
  Filled 2020-10-11 (×4): qty 250

## 2020-10-11 MED FILL — Heparin Sod (Porcine)-NaCl IV Soln 1000 Unit/500ML-0.9%: INTRAVENOUS | Qty: 1000 | Status: AC

## 2020-10-11 NOTE — Progress Notes (Signed)
Left upper extremity venous duplex completed. Refer to "CV Proc" under chart review to view preliminary results.  10/11/2020 11:58 AM Eula Fried., MHA, RVT, RDCS, RDMS

## 2020-10-11 NOTE — Progress Notes (Signed)
Pharmacy Antibiotic Note  Karl Peters is a 80 y.o. male admitted on 10/07/2020 with  generalized weakness . Patient with new fever and sirs criteria noted overnight. Noted patient had leaking PIV removed yesterday that was purulent, started on empiric doxycycline. Now with concern for sepsis antibiotics will be broadened to vancomycin and ceftriaxone.   Patient with tmax of 101.2, wbc 13, scr elevated but stable at 1.4. Blood cultures sent overnight.   Vancomycin 2.5g IV now the 1250 mg IV Q 24 hrs. Goal AUC 400-550. Expected AUC: 539 SCr used: 1.4   Plan: Vancomycin Vancomycin 2.5g IV now then 1250 IV every 24 hours.  Goal trough 15-20 mcg/mL. Ceftriaxone 2g IV q24 hours  Height: 5\' 8"  (172.7 cm) Weight: 118.3 kg (260 lb 12.8 oz) IBW/kg (Calculated) : 68.4  Temp (24hrs), Avg:99.6 F (37.6 C), Min:97.6 F (36.4 C), Max:101.2 F (38.4 C)  Recent Labs  Lab 10/07/20 1641 10/08/20 0211 10/09/20 0243 10/10/20 0257 10/10/20 0928 10/11/20 0201  WBC 9.1  --   --  9.4  --  13.3*  CREATININE 1.73* 1.59* 1.63* 1.70* 1.49* 1.43*    Estimated Creatinine Clearance: 53.2 mL/min (A) (by C-G formula based on SCr of 1.43 mg/dL (H)).    Allergies  Allergen Reactions   Jardiance [Empagliflozin] Nausea Only    Antimicrobials this admission: Doxy 8/25>8/26 Vanc 8/26> Ceftriaxone 8/26>  Microbiology results: 8/25 bldx2: sent  Thank you for allowing pharmacy to be a part of this patient's care.  9/25 PharmD., BCPS Clinical Pharmacist 10/11/2020 10:28 AM

## 2020-10-11 NOTE — Progress Notes (Signed)
ANTICOAGULATION CONSULT NOTE  Pharmacy Consult for heparin Indication:  rule out dvt, thrombophlebitis  Allergies  Allergen Reactions   Jardiance [Empagliflozin] Nausea Only    Patient Measurements: Height: 5\' 8"  (172.7 cm) Weight: 118.3 kg (260 lb 12.8 oz) IBW/kg (Calculated) : 68.4 Heparin Dosing Weight: 95kg  Vital Signs: Temp: 97.8 F (36.6 C) (08/26 1402) Temp Source: Oral (08/26 1402) BP: 148/74 (08/26 1427) Pulse Rate: 81 (08/26 1402)  Labs: Recent Labs    10/10/20 0257 10/10/20 0928 10/11/20 0201 10/11/20 1821  HGB 13.1  --  13.3  --   HCT 40.6  --  41.1  --   PLT 219  --  196  --   HEPARINUNFRC  --   --   --  0.24*  CREATININE 1.70* 1.49* 1.43*  --      Estimated Creatinine Clearance: 53.2 mL/min (A) (by C-G formula based on SCr of 1.43 mg/dL (H)).   Assessment: Karl Peters is a 80 y.o. male admitted on 10/07/2020 with generalized weakness. Patient with new fever and sirs criteria noted overnight. Noted patient had leaking PIV removed yesterday that was purulent, started on empiric antibiotics. Concern for upper extremity dvt so will change for prophylactic to full dose IV heparin for now.   Initial heparin level is sub-therapeutic at 0.24 units/mL.  RN reports that heparin was interrupted for ~30 minutes around 1530 due to loss of IV access.  No bleeding reported.  Goal of Therapy:  Heparin level 0.3-0.7 units/ml Monitor platelets by anticoagulation protocol: Yes   Plan:  Increase heparin gtt to 1650 units/hr F/U AM labs  Ilah Boule D. 10/09/2020, PharmD, BCPS, BCCCP 10/11/2020, 7:49 PM

## 2020-10-11 NOTE — Progress Notes (Unsigned)
Enrolled patient for a 14 day Zio XT Monitor to be mailed to patients home  

## 2020-10-11 NOTE — Progress Notes (Addendum)
PROGRESS NOTE    Karl Peters  ZYS:[id number] DOB: 23-Jun-1940 DOA: 10/07/2020 PCP: Elizabeth Palau, FNP   Chief Complaint  Patient presents with   Weakness   Nausea   Brief Narrative:  This 80 years old male with PMH significant for type 2 diabetes, hypertension, hyperlipidemia, prior NSVT and symptomatic PVCs treated with beta-blockers , presented in the ED with generalized weakness.  Patient was started on Jardiance 2 weeks ago which made him feel so bad,  he stopped taking it, then he presented with nausea, vomiting and generalized weakness.  He reports he was working in his garden when he became very tired and had to sit down and developed short of breath but denies any chest pain.  He is found to have slightly elevated serum creatinine from his baseline, EKG shows some abnormalities multiple PVCs.  Cardiology was consulted, recommended to repeat echocardiogram and would not pursue another ischemic evaluation unless there is any change in his systolic function.  Continue Coreg twice.  He's now s/p stress test, area of reduced perfusion concerning for infarct.  Planning for cath 8/25 AM.  Assessment & Plan:   Principal Problem:   AKI (acute kidney injury) (HCC) Active Problems:   DM2 (diabetes mellitus, type 2) (HCC)   Essential hypertension   NSVT (nonsustained ventricular tachycardia) (HCC)   Syncope   Hypotension   Chest pressure   Arrhythmia   SOB (shortness of breath)  Septic Thrombophlebitis  Sepsis  Yesterday had PIV leaking, removed and noted purulence - started on doxycycline LUE [place] pending Fever overnight, tachypnea, new leukocytosis -> sepsis Blood cultures obtained Broaden to vanc/ceftriaxone Exam with worsened swelling this AM, will start heparin as well with concern for possible VTE while awaiting [place] Low threshold for additional imaging to r/o abscess Suspect general malaise/nausea this AM related to infection Addendum: results of [place] notable for SVT, but  relatively extensive per discussion with [place] (likely >5 cm), will treat with anticoagulation for now as tolerated - can consider d/c if he improves (consider serial [place])  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%, mildly decreased function - no RWMA Myoview with large area of reduced perfusion at rest and stress in inferior and inferolateral wall from apex to base, concerning for infarct Patient has multiple PVCs on EKG S/p LHC without significant obstructive coronary disease in the major arteries -> recommending risk factor modification to prevent progression of mild coronary atherosclerosis -> EP evaluation  Transition to toprol  Imdur added, hydral 10 mg TID EP eval, appreciate recommendations - recommending resuming beta blocker, hydration - no further EP w/u planned Follow TSH wnl Cardiology now signed off -> ASA, hydral, imdur, toprol, crestor - holding on diuretics at this time - recommending ziopatch x2 weeks and bmet in 1 week - outpatient f/u with Dr. Johney Frame in 4 weeks  Acute kidney injury: Presented with serum creatinine of 1.7 increased from his baseline 1.0 Very gradually improving UA bland Renal US normal  Continue IVF Holding ARB  Hypertension: Continue Coreg, Norvasc . resume valsartan once renal function improves.   DM2: Hold oral diabetic medications Regular insulin sliding scale   Hyperlipidemia :  continue Crestor (increased to 40)   RUQ pain LFTs normal, ultrasound unremarkable.  DVT prophylaxis: heparin  Code Status: full  Family Communication: none at bedside Disposition:   Status is: Inpatient  Remains inpatient appropriate because:Inpatient level of care appropriate due to severity of illness  Dispo: The  patient is from: Home              Anticipated d/c is to: Home              Patient currently is not medically stable to d/c.   Difficult to place patient No       Consultants:   cardiologt  Procedures:  Echo IMPRESSIONS     1. Left ventricular ejection fraction, by estimation, is 40 to 45% with  significant beat to beat variability. The left ventricle has mildly  decreased function. The left ventricle has no regional wall motion  abnormalities. Left ventricular diastolic  parameters are indeterminate.   2. Right ventricular systolic function is normal. The right ventricular  size is normal. There is normal pulmonary artery systolic pressure. The  estimated right ventricular systolic pressure is 34.8 mmHg.   3. The mitral valve is grossly normal. Trivial mitral valve  regurgitation. No evidence of mitral stenosis.   4. The aortic valve is grossly normal. Aortic valve regurgitation is  trivial. No aortic stenosis is present.   5. The inferior vena cava is normal in size with <50% respiratory  variability, suggesting right atrial pressure of 8 mmHg.   LHC  No significant obstructive coronary disease in the major arteries.   LAD with segmental 50% mid stenosis.  Second diagonal with 70% ostial to proximal narrowing.  Vessel covers Dera Vanaken very small territory and bifurcates proximally.   Codominant widely patent RCA   30% proximal ramus intermedius.   Codominant right coronary without significant obstruction.   Normal LVEDP   RECOMMENDATIONS: Per treating team EP evaluation is planned. Risk factor modification to prevent progression of mild coronary atherosclerosis Antimicrobials:  Anti-infectives (From admission, onward)    Start     Dose/Rate Route Frequency Ordered Stop   10/11/20 1100  cefTRIAXone (ROCEPHIN) 2 g in sodium chloride 0.9 % 100 mL IVPB        2 g 200 mL/hr over 30 Minutes Intravenous Every 24 hours 10/11/20 1000     10/11/20 1100  vancomycin (VANCOCIN) 2,500 mg in sodium chloride 0.9 % 500 mL IVPB        2,500 mg 250 mL/hr over 120 Minutes Intravenous STAT 10/11/20 1004 10/12/20 1100   10/10/20 2200  doxycycline (VIBRA-TABS) tablet 100 mg   Status:  Discontinued        100 mg Oral Every 12 hours 10/10/20 1731 10/11/20 0951          Subjective: C/o nausea  Objective: Vitals:   10/10/20 1654 10/10/20 2020 10/11/20 0544 10/11/20 1005  BP: 122/68 (!) 158/79 140/70 (!) 152/73  Pulse: 78 84 77 79  Resp: (!) 25 20 (!) 22   Temp:  (!) 101.2 F (38.4 C) 100.2 F (37.9 C)   TempSrc:  Oral Oral   SpO2: 96% 96% 95%   Weight:      Height:        Intake/Output Summary (Last 24 hours) at 10/11/2020 1016 Last data filed at 10/11/2020 0300 Gross per 24 hour  Intake --  Output 1510 ml  Net -1510 ml   Filed Weights   10/08/20 1745 10/10/20 0639  Weight: 121.3 kg 118.3 kg    Examination:  General: No acute distress. Cardiovascular: RRR Lungs: unlabored Abdomen: Soft, nontender, nondistended  Neurological: Alert and oriented 3. Moves all extremities 4 with equal strength. Cranial nerves II through XII grossly intact. Skin: Warm and dry. No rashes or lesions. Extremities: LUE with worsened swelling  since yesterday, redness around L AC which spreads to bicep - palpable cord noted   Data Reviewed: I have personally reviewed following labs and imaging studies  CBC: Recent Labs  Lab 10/07/20 1641 10/10/20 0257 10/11/20 0201  WBC 9.1 9.4 13.3*  NEUTROABS 6.3 5.2 11.0*  HGB 13.8 13.1 13.3  HCT 42.5 40.6 41.1  MCV 91.4 91.2 89.3  PLT 211 219 196    Basic Metabolic Panel: Recent Labs  Lab 10/07/20 1641 10/08/20 0211 10/09/20 0243 10/10/20 0257 10/10/20 0928 10/11/20 0201  NA 137 138 140 139 137 134*  K 4.6 4.3 5.1 4.9 4.7 4.2  CL 108 109 104 107 107 104  CO2 22 23 27 26 25 22   GLUCOSE 115* 158* 80 77 82 113*  BUN 30* 27* 23 21 20 20   CREATININE 1.73* 1.59* 1.63* 1.70* 1.49* 1.43*  CALCIUM 8.9 8.9 9.2 9.0 8.6* 8.7*  MG 2.0  --   --  1.9  --  1.7  PHOS  --   --   --  2.6  --  2.0*    GFR: Estimated Creatinine Clearance: 53.2 mL/min ([place]) (by C-G formula based on SCr of 1.43 mg/dL (H)).  Liver  Function Tests: Recent Labs  Lab 10/07/20 1641 10/10/20 0257  AST 16 12*  ALT 10 10  ALKPHOS 87 74  BILITOT 0.7 0.5  PROT 6.9 6.2*  ALBUMIN 3.6 3.2*    CBG: Recent Labs  Lab 10/10/20 0807 10/10/20 1213 10/10/20 1635 10/10/20 2013 10/11/20 0743  GLUCAP 90 70 141* 124* 87     Recent Results (from the past 240 hour(s))  Resp Panel by RT-PCR (Flu Brendi Mccarroll&B, Covid) Nasopharyngeal Swab     Status: None   Collection Time: 10/07/20  4:47 PM   Specimen: Nasopharyngeal Swab; Nasopharyngeal(NP) swabs in vial transport medium  Result Value Ref Range Status   SARS Coronavirus 2 by RT PCR NEGATIVE NEGATIVE Final    Comment: (NOTE) SARS-CoV-2 target nucleic acids are NOT DETECTED.  The SARS-CoV-2 RNA is generally detectable in upper respiratory specimens during the acute phase of infection. The lowest concentration of SARS-CoV-2 viral copies this assay can detect is 138 copies/mL. Ramesh Moan negative result does not preclude SARS-Cov-2 infection and should not be used as the sole basis for treatment or other patient management decisions. Shayleigh Bouldin negative result may occur with  improper specimen collection/handling, submission of specimen other than nasopharyngeal swab, presence of viral mutation(s) within the areas targeted by this assay, and inadequate number of viral copies(<138 copies/mL). Ahnesty Finfrock negative result must be combined with clinical observations, patient history, and epidemiological information. The expected result is Negative.  Fact Sheet for Patients:  BloggerCourse.com  Fact Sheet for Healthcare Providers:  SeriousBroker.it  This test is no t yet approved or cleared by the [place] FDA and  has been authorized for detection and/or diagnosis of SARS-CoV-2 by FDA under an Emergency Use Authorization (EUA). This EUA will remain  in effect (meaning this test can be used) for the duration of the COVID-19 declaration under Section  564(b)(1) of the Act, 21 U.S.C.section 360bbb-3(b)(1), unless the authorization is terminated  or revoked sooner.       Influenza Juliana Boling by PCR NEGATIVE NEGATIVE Final   Influenza B by PCR NEGATIVE NEGATIVE Final    Comment: (NOTE) The Xpert Xpress SARS-CoV-2/FLU/RSV plus assay is intended as an aid in the diagnosis of influenza from Nasopharyngeal swab specimens and should not be used as Coriann Brouhard sole basis for treatment. Nasal washings and  aspirates are unacceptable for Xpert Xpress SARS-CoV-2/FLU/RSV testing.  Fact Sheet for Patients: BloggerCourse.com  Fact Sheet for Healthcare Providers: SeriousBroker.it  This test is not yet approved or cleared by the [place] FDA and has been authorized for detection and/or diagnosis of SARS-CoV-2 by FDA under an Emergency Use Authorization (EUA). This EUA will remain in effect (meaning this test can be used) for the duration of the COVID-19 declaration under Section 564(b)(1) of the Act, 21 U.S.C. section 360bbb-3(b)(1), unless the authorization is terminated or revoked.  Performed at [facility], [address], [place], [place] [address]          Radiology Studies: CARDIAC CATHETERIZATION  Result Date: 10/10/2020   No significant obstructive coronary disease in the major arteries.   LAD with segmental 50% mid stenosis.  Second diagonal with 70% ostial to proximal narrowing.  Vessel covers Braylynn Lewing very small territory and bifurcates proximally.   Codominant widely patent RCA   30% proximal ramus intermedius.   Codominant right coronary without significant obstruction.   Normal LVEDP RECOMMENDATIONS: Per treating team EP evaluation is planned. Risk factor modification to prevent progression of mild coronary atherosclerosis   US RENAL  Result Date: 10/09/2020 CLINICAL DATA:  Acute kidney injury EXAM: RENAL / URINARY TRACT ULTRASOUND COMPLETE COMPARISON:  Ultrasound abdomen 10/07/2020  FINDINGS: Right Kidney: Renal measurements: 10.4 x 5.4 x 4.7 cm = volume: 136 mL. Echogenicity within normal limits. No mass or hydronephrosis visualized. Left Kidney: Renal measurements: 11.1 x 4.6 x 4.7 cm = volume: 125 mL. Echogenicity within normal limits. No mass or hydronephrosis visualized. Bladder: Appears normal for degree of bladder distention. Other: None. IMPRESSION: Normal ultrasound examination of the kidneys.  No hydronephrosis. Electronically Signed   By: Burman Nieves M.D.   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 The study is high risk.   No ST deviation was noted.   LV perfusion is abnormal.   Defect 1: There is Edwing Figley large defect with moderate reduction in uptake present in the apical to basal inferior and inferolateral location(s) that is fixed. There is abnormal wall motion in the defect area. Consistent with infarction.   The left ventricular ejection fraction is severely decreased (<30%). Left ventricular function is abnormal. Global function is severely reduced. End diastolic cavity size is moderately enlarged. End systolic cavity size is moderately enlarged. Difficulty in assessment of EF/wall motion due to frequent ectopy. Large area of reduced perfusion at both rest and stress in the inferior and inferolateral wall, from apex to base. Concerning for infarct. Findings communicated with Dr. Mayford Knife.        Scheduled Meds:  aspirin  81 mg Oral Daily   buPROPion  300 mg Oral q morning   heparin  5,000 Units Subcutaneous Q8H   hydrALAZINE  10 mg Oral Q8H   insulin aspart  0-15 Units Subcutaneous TID WC   insulin aspart  0-5 Units Subcutaneous QHS   isosorbide mononitrate  15 mg Oral Daily   metoprolol succinate  50 mg Oral Daily   montelukast  10 mg Oral q morning   pantoprazole  40 mg Oral q morning   rosuvastatin  40 mg Oral q morning   sodium chloride flush  3  mL Intravenous Q12H   sodium chloride flush  3 mL Intravenous Q12H   vitamin B-12  1,000 mcg Oral q morning   Continuous  Infusions:  sodium chloride     cefTRIAXone (ROCEPHIN)  IV     vancomycin       LOS: 2 days    Time spent: over 30 min    Lacretia Nicks, MD [facility]   To contact the attending provider between 7A-7P or the covering provider during after hours 7P-7A, please log into the web site [email or web address] and access using universal [place] password for that web site. If you do not have the password, please call the hospital operator.  10/11/2020, 10:16 AM

## 2020-10-11 NOTE — Progress Notes (Signed)
PHARMACY - PHYSICIAN COMMUNICATION CRITICAL VALUE ALERT - BLOOD CULTURE IDENTIFICATION (BCID)  Karl Peters is an 80 y.o. male who presented to [facility] on 10/07/2020 with a chief complaint of generalized weakness.  Patient had a leaking pIV that was removed and noted purulence.  Assessment:  1 of 4 bottle is growing Strep pyogenes thus far.  Name of physician (or Provider) Contacted: Dr. Antionette Char  Current antibiotics: vancomycin and Rocephin  Changes to prescribed antibiotics recommended:  Recommendations accepted by provider.  Narrow to PCN and clindamycin  Results for orders placed or performed during the hospital encounter of 10/07/20  Blood Culture ID Panel (Reflexed) (Collected: 10/10/2020 10:24 PM)  Result Value Ref Range   Enterococcus faecalis NOT DETECTED NOT DETECTED   Enterococcus Faecium NOT DETECTED NOT DETECTED   Listeria monocytogenes NOT DETECTED NOT DETECTED   Staphylococcus species NOT DETECTED NOT DETECTED   Staphylococcus aureus (BCID) NOT DETECTED NOT DETECTED   Staphylococcus epidermidis NOT DETECTED NOT DETECTED   Staphylococcus lugdunensis NOT DETECTED NOT DETECTED   Streptococcus species DETECTED (A) NOT DETECTED   Streptococcus agalactiae NOT DETECTED NOT DETECTED   Streptococcus pneumoniae NOT DETECTED NOT DETECTED   Streptococcus pyogenes DETECTED (A) NOT DETECTED   A.calcoaceticus-baumannii NOT DETECTED NOT DETECTED   Bacteroides fragilis NOT DETECTED NOT DETECTED   Enterobacterales NOT DETECTED NOT DETECTED   Enterobacter cloacae complex NOT DETECTED NOT DETECTED   Escherichia coli NOT DETECTED NOT DETECTED   Klebsiella aerogenes NOT DETECTED NOT DETECTED   Klebsiella oxytoca NOT DETECTED NOT DETECTED   Klebsiella pneumoniae NOT DETECTED NOT DETECTED   Proteus species NOT DETECTED NOT DETECTED   Salmonella species NOT DETECTED NOT DETECTED   Serratia marcescens NOT DETECTED NOT DETECTED   Haemophilus influenzae NOT DETECTED NOT DETECTED   Neisseria  meningitidis NOT DETECTED NOT DETECTED   Pseudomonas aeruginosa NOT DETECTED NOT DETECTED   Stenotrophomonas maltophilia NOT DETECTED NOT DETECTED   Candida albicans NOT DETECTED NOT DETECTED   Candida auris NOT DETECTED NOT DETECTED   Candida glabrata NOT DETECTED NOT DETECTED   Candida krusei NOT DETECTED NOT DETECTED   Candida parapsilosis NOT DETECTED NOT DETECTED   Candida tropicalis NOT DETECTED NOT DETECTED   Cryptococcus neoformans/gattii NOT DETECTED NOT DETECTED    Kacie Huxtable D. Laney Potash, PharmD, BCPS, BCCCP 10/11/2020, 10:14 PM

## 2020-10-11 NOTE — Progress Notes (Addendum)
Pt is A&Ox4, family stated that pt "seemed a little off" after his heart catheterization. New fever of 101.2 F, Opyd, MD made aware and PRN Tylenol given. Pt's urine was assessed to have a foul odor and amber in color; pt was having tremors/shivering and he stated that is normal for him, occurring prior to admission. Very unstable when ambulating, change from baseline. CBG was WNR. Blood cultures ordered, will continue to monitor.   Bari Edward, RN

## 2020-10-11 NOTE — Progress Notes (Signed)
ANTICOAGULATION CONSULT NOTE - Initial Consult  Pharmacy Consult for heparin Indication:  rule out dvt, thrombophlebitis  Allergies  Allergen Reactions   Jardiance [Empagliflozin] Nausea Only    Patient Measurements: Height: 5\' 8"  (172.7 cm) Weight: 118.3 kg (260 lb 12.8 oz) IBW/kg (Calculated) : 68.4 Heparin Dosing Weight: 95kg  Vital Signs: Temp: 100.2 F (37.9 C) (08/26 0544) Temp Source: Oral (08/26 0544) BP: 152/73 (08/26 1005) Pulse Rate: 79 (08/26 1005)  Labs: Recent Labs    10/10/20 0257 10/10/20 0928 10/11/20 0201  HGB 13.1  --  13.3  HCT 40.6  --  41.1  PLT 219  --  196  CREATININE 1.70* 1.49* 1.43*    Estimated Creatinine Clearance: 53.2 mL/min (A) (by C-G formula based on SCr of 1.43 mg/dL (H)).   Medical History: Past Medical History:  Diagnosis Date   Diabetes mellitus    Dyslipidemia    Hx of cardiac catheterization    a. normal cors by cath in 1998 and 2010   Hx of echocardiogram    Echocardiogram 06/10/12: Normal LV wall thickness, EF 50-55%, grade 1 diastolic dysfunction, mild LAE   Hypertension    Obesity    Ventricular tachycardia (HCC)    and symptomatic PVCs - Rx with beta blocker   Assessment: Karl Peters is a 80 y.o. male admitted on 10/07/2020 with  generalized weakness . Patient with new fever and sirs criteria noted overnight. Noted patient had leaking PIV removed yesterday that was purulent, started on empiric antibiotics. Concern for upper extremity dvt so will change for prophylactic to full dose IV heparin for now. CBC stable. Not on anticoagulants prior to admit.   Goal of Therapy:  Heparin level 0.3-0.7 units/ml Monitor platelets by anticoagulation protocol: Yes   Plan:  Give 4000 units bolus x 1 Start heparin infusion at 1500 units/hr Check anti-Xa level in 8 hours and daily while on heparin Continue to monitor H&H and platelets  10/09/2020 PharmD., BCPS Clinical Pharmacist 10/11/2020 10:40 AM

## 2020-10-12 DIAGNOSIS — N179 Acute kidney failure, unspecified: Secondary | ICD-10-CM | POA: Diagnosis not present

## 2020-10-12 LAB — GLUCOSE, CAPILLARY
Glucose-Capillary: 102 mg/dL — ABNORMAL HIGH (ref 70–99)
Glucose-Capillary: 138 mg/dL — ABNORMAL HIGH (ref 70–99)
Glucose-Capillary: 170 mg/dL — ABNORMAL HIGH (ref 70–99)
Glucose-Capillary: 152 mg/dL — ABNORMAL HIGH (ref 70–99)

## 2020-10-12 LAB — HEPARIN LEVEL (UNFRACTIONATED)
Heparin Unfractionated: 0.48 IU/mL (ref 0.30–0.70)
Heparin Unfractionated: 0.39 IU/mL (ref 0.30–0.70)

## 2020-10-12 LAB — PHOSPHORUS: Phosphorus: 2.7 mg/dL (ref 2.5–4.6)

## 2020-10-12 LAB — MAGNESIUM: Magnesium: 1.7 mg/dL (ref 1.7–2.4)

## 2020-10-12 NOTE — Progress Notes (Addendum)
ANTICOAGULATION CONSULT NOTE  Pharmacy Consult for heparin Indication: acute superficial vein thrombosis involving the left cephalic vein.   Allergies  Allergen Reactions   Jardiance [Empagliflozin] Nausea Only    Patient Measurements: Height: 5\' 8"  (172.7 cm) Weight: 118.3 kg (260 lb 12.8 oz) IBW/kg (Calculated) : 68.4 Heparin Dosing Weight: 95kg  Vital Signs: Temp: 98.7 F (37.1 C) (08/27 0626) Temp Source: Oral (08/27 0626) BP: 102/63 (08/27 0626) Pulse Rate: 75 (08/27 0626)  Labs: Recent Labs    10/10/20 0257 10/10/20 0928 10/11/20 0201 10/11/20 1821 10/12/20 0330  HGB 13.1  --  13.3  --   --   HCT 40.6  --  41.1  --   --   PLT 219  --  196  --   --   HEPARINUNFRC  --   --   --  0.24* 0.48  CREATININE 1.70* 1.49* 1.43*  --   --      Estimated Creatinine Clearance: 53.2 mL/min (A) (by C-G formula based on SCr of 1.43 mg/dL (H)).   Assessment: Karl Peters is a 80 y.o. male admitted on 10/07/2020 with generalized weakness. Started on therapeutic heparin for concern for upper extremity dvt.  Upper extremity 10/09/2020 done 8/26 found no evidence of DVT but findings consistent with acute superficial vein thrombosis involving the left cephalic vein.   HL is theraputic at 0.48. Per convo with RN, heparin gtt has not been interrupted. RN in the room with patient while on the phone with me. Endorses no s/sx of bleeding or issues with infusion.   Goal of Therapy:  Heparin level 0.3-0.7 units/ml Monitor platelets by anticoagulation protocol: Yes   Plan:  Continue heparin gtt to 1650 units/hr Recheck HL in 8 hours, if therapeutic then check HL daily.  Monitor CBC and s/sx bleeding F/U [facility] plan  [facility], Pharm.D. PGY-1 Ambulatory Care Resident Phone:[phone] 10/12/2020 10:03 AM

## 2020-10-12 NOTE — Progress Notes (Signed)
PROGRESS NOTE    Karl Peters  IWO:[id number] DOB: [birth year] DOA: 10/07/2020 PCP: Elizabeth Palau, FNP   Chief Complaint  Patient presents with   Weakness   Nausea   Brief Narrative:  This 80 years old male with PMH significant for type 2 diabetes, hypertension, hyperlipidemia, prior NSVT and symptomatic PVCs treated with beta-blockers , presented in the ED with generalized weakness.  Patient was started on Jardiance 2 weeks ago which made him feel so bad,  he stopped taking it, then he presented with nausea, vomiting and generalized weakness.  He reports he was working in his garden when he became very tired and had to sit down and developed short of breath but denies any chest pain.  He is found to have slightly elevated serum creatinine from his baseline, EKG shows some abnormalities multiple PVCs.  Cardiology was consulted, recommended to repeat echocardiogram and would not pursue another ischemic evaluation unless there is any change in his systolic function.  Continue Coreg twice.  He's now s/p stress test, area of reduced perfusion concerning for infarct.  Planning for cath 8/25 AM.  Assessment & Plan:   Principal Problem:   AKI (acute kidney injury) (HCC) Active Problems:   DM2 (diabetes mellitus, type 2) (HCC)   Essential hypertension   NSVT (nonsustained ventricular tachycardia) (HCC)   Syncope   Hypotension   Chest pressure   Arrhythmia   SOB (shortness of breath)  Septic Thrombophlebitis  Sepsis  Strep Pyogenes Bacteremia  Superficial Vein Thrombosis of L Cephalic Vein LUE [place] notable for superficial venous thrombosis - continue heparin for now (per discussion with [place] tech, clot likely >5 cm).  Will consider ppx treatment vs treatment (will discuss further with family) - not great guidance for upper extremity SVT.  Will refer to follow up with vascular outpatient (discussed informally).   Fever again 8/26 Strep pyogenes bacteremia Repeat blood cultures pending ID  c/s, appreciate recs -> penicillin for now, amoxicllin x 14 days (500 mg TID) at the time of discharge.  Need 24 hrs afebrile prior to discharge.  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%, mildly decreased function - no RWMA Myoview with large area of reduced perfusion at rest and stress in inferior and inferolateral wall from apex to base, concerning for infarct Patient has multiple PVCs on EKG S/p LHC without significant obstructive coronary disease in the major arteries -> recommending risk factor modification to prevent progression of mild coronary atherosclerosis -> EP evaluation  Transition to toprol  Imdur added, hydral 10 mg TID EP eval, appreciate recommendations - recommending resuming beta blocker, hydration - no further EP w/u planned Follow TSH wnl Cardiology now signed off -> ASA, hydral, imdur, toprol, crestor - holding on diuretics at this time - recommending ziopatch x2 weeks and bmet in 1 week - outpatient f/u with Dr. Johney Frame in 4 weeks  Acute kidney injury: Presented with serum creatinine of 1.7 increased from his baseline 1.0 Very gradually improving UA bland Renal US normal  Continue IVF Holding ARB  Hypertension: Continue Coreg, Norvasc . resume valsartan once renal function improves.   DM2: Hold oral diabetic medications Regular insulin sliding scale   Hyperlipidemia :  continue Crestor (increased to 40)   RUQ pain LFTs normal, ultrasound unremarkable.  DVT prophylaxis: heparin  Code Status: full  Family Communication: none at bedside Disposition:   Status is: Inpatient  Remains inpatient appropriate because:Inpatient level of care appropriate due to severity  PROGRESS NOTE    Karl Peters  IWO:[id number] DOB: [birth year] DOA: 10/07/2020 PCP: Elizabeth Palau, FNP   Chief Complaint  Patient presents with   Weakness   Nausea   Brief Narrative:  This 80 years old male with PMH significant for type 2 diabetes, hypertension, hyperlipidemia, prior NSVT and symptomatic PVCs treated with beta-blockers , presented in the ED with generalized weakness.  Patient was started on Jardiance 2 weeks ago which made him feel so bad,  he stopped taking it, then he presented with nausea, vomiting and generalized weakness.  He reports he was working in his garden when he became very tired and had to sit down and developed short of breath but denies any chest pain.  He is found to have slightly elevated serum creatinine from his baseline, EKG shows some abnormalities multiple PVCs.  Cardiology was consulted, recommended to repeat echocardiogram and would not pursue another ischemic evaluation unless there is any change in his systolic function.  Continue Coreg twice.  He's now s/p stress test, area of reduced perfusion concerning for infarct.  Planning for cath 8/25 AM.  Assessment & Plan:   Principal Problem:   AKI (acute kidney injury) (HCC) Active Problems:   DM2 (diabetes mellitus, type 2) (HCC)   Essential hypertension   NSVT (nonsustained ventricular tachycardia) (HCC)   Syncope   Hypotension   Chest pressure   Arrhythmia   SOB (shortness of breath)  Septic Thrombophlebitis  Sepsis  Strep Pyogenes Bacteremia  Superficial Vein Thrombosis of L Cephalic Vein LUE [place] notable for superficial venous thrombosis - continue heparin for now (per discussion with [place] tech, clot likely >5 cm).  Will consider ppx treatment vs treatment (will discuss further with family) - not great guidance for upper extremity SVT.  Will refer to follow up with vascular outpatient (discussed informally).   Fever again 8/26 Strep pyogenes bacteremia Repeat blood cultures pending ID  c/s, appreciate recs -> penicillin for now, amoxicllin x 14 days (500 mg TID) at the time of discharge.  Need 24 hrs afebrile prior to discharge.  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%, mildly decreased function - no RWMA Myoview with large area of reduced perfusion at rest and stress in inferior and inferolateral wall from apex to base, concerning for infarct Patient has multiple PVCs on EKG S/p LHC without significant obstructive coronary disease in the major arteries -> recommending risk factor modification to prevent progression of mild coronary atherosclerosis -> EP evaluation  Transition to toprol  Imdur added, hydral 10 mg TID EP eval, appreciate recommendations - recommending resuming beta blocker, hydration - no further EP w/u planned Follow TSH wnl Cardiology now signed off -> ASA, hydral, imdur, toprol, crestor - holding on diuretics at this time - recommending ziopatch x2 weeks and bmet in 1 week - outpatient f/u with Dr. Johney Frame in 4 weeks  Acute kidney injury: Presented with serum creatinine of 1.7 increased from his baseline 1.0 Very gradually improving UA bland Renal US normal  Continue IVF Holding ARB  Hypertension: Continue Coreg, Norvasc . resume valsartan once renal function improves.   DM2: Hold oral diabetic medications Regular insulin sliding scale   Hyperlipidemia :  continue Crestor (increased to 40)   RUQ pain LFTs normal, ultrasound unremarkable.  DVT prophylaxis: heparin  Code Status: full  Family Communication: none at bedside Disposition:   Status is: Inpatient  Remains inpatient appropriate because:Inpatient level of care appropriate due to severity  Streptococcus species DETECTED (Karl Peters) NOT DETECTED Final    Comment: CRITICAL RESULT CALLED TO, READ BACK BY AND VERIFIED WITH: PHARMD T.DANTE 10/11/20@21 :49 BY TW    Streptococcus agalactiae NOT DETECTED NOT DETECTED  Final   Streptococcus pneumoniae NOT DETECTED NOT DETECTED Final   Streptococcus pyogenes DETECTED (Karl Peters) NOT DETECTED Final    Comment: CRITICAL RESULT CALLED TO, READ BACK BY AND VERIFIED WITH: PHARMD T.DANTE 10/11/20@21 :49 BY TW    Karl Peters.calcoaceticus-baumannii NOT DETECTED NOT DETECTED Final   Bacteroides fragilis NOT DETECTED NOT DETECTED Final   Enterobacterales NOT DETECTED NOT DETECTED Final   Enterobacter cloacae complex NOT DETECTED NOT DETECTED Final   Escherichia coli NOT DETECTED NOT DETECTED Final   Klebsiella aerogenes NOT DETECTED NOT DETECTED Final   Klebsiella oxytoca NOT DETECTED NOT DETECTED Final   Klebsiella pneumoniae NOT DETECTED NOT DETECTED Final   Proteus species NOT DETECTED NOT DETECTED Final   Salmonella species NOT DETECTED NOT DETECTED Final   Serratia marcescens NOT DETECTED NOT DETECTED Final   Haemophilus influenzae NOT DETECTED NOT DETECTED Final   Neisseria meningitidis NOT DETECTED NOT DETECTED Final   Pseudomonas aeruginosa NOT DETECTED NOT DETECTED Final   Stenotrophomonas maltophilia NOT DETECTED NOT DETECTED Final   Candida albicans NOT DETECTED NOT DETECTED Final   Candida auris NOT DETECTED NOT DETECTED Final   Candida glabrata NOT DETECTED NOT DETECTED Final   Candida krusei NOT DETECTED NOT DETECTED Final   Candida parapsilosis NOT DETECTED NOT DETECTED Final   Candida tropicalis NOT DETECTED NOT DETECTED Final   Cryptococcus neoformans/gattii NOT DETECTED NOT DETECTED Final    Comment: Performed at [facility], [address], [place], [place] [address]         Radiology Studies: DG CHEST PORT 1 VIEW  Result Date: 10/11/2020 CLINICAL DATA:  Fever EXAM: PORTABLE CHEST 1 VIEW COMPARISON:  Portable exam 0832 hours compared to 10/07/2020 FINDINGS: Normal heart size, mediastinal contours, and pulmonary vascularity. Mild atelectasis at RIGHT base. Lungs otherwise clear. No infiltrate, pleural effusion, or pneumothorax. Prior cervical spine  fusion. IMPRESSION: Mild RIGHT basilar atelectasis. Electronically Signed   By: Ulyses Southward M.D.   On: 10/11/2020 11:03   VAS [place] UPPER EXTREMITY VENOUS DUPLEX  Result Date: 10/11/2020 UPPER VENOUS STUDY  Patient Name:  Karl Peters  Date of Exam:   10/11/2020 Medical Rec #: [id number]      Accession #:    [id number] Date of Birth: 17-Feb-1940     Patient Gender: M Patient Age:   32 years Exam Location:  [facility] Procedure:      VAS [place] UPPER EXTREMITY VENOUS DUPLEX Referring Phys: Willet Schleifer POWELL JR --------------------------------------------------------------------------------  Indications: pain, edema, and erythema at recent IV site. Comparison Study: No prior study Performing Technologist: Gertie Fey MHA, RDMS, RVT, RDCS  Examination Guidelines: Shundra Wirsing complete evaluation includes B-mode imaging, spectral Doppler, color Doppler, and power Doppler as needed of all accessible portions of each vessel. Bilateral testing is considered an integral part of Sarina Robleto complete examination. Limited examinations for reoccurring indications may be performed as noted.  Right Findings: +----------+------------+---------+-----------+----------+-------+ RIGHT     CompressiblePhasicitySpontaneousPropertiesSummary +----------+------------+---------+-----------+----------+-------+ Subclavian               Yes       Yes                      +----------+------------+---------+-----------+----------+-------+  Left Findings: +----------+------------+---------+-----------+----------+-------+ LEFT      CompressiblePhasicitySpontaneousPropertiesSummary +----------+------------+---------+-----------+----------+-------+ IJV  PROGRESS NOTE    Karl Peters  IWO:[id number] DOB: [birth year] DOA: 10/07/2020 PCP: Elizabeth Palau, FNP   Chief Complaint  Patient presents with   Weakness   Nausea   Brief Narrative:  This 80 years old male with PMH significant for type 2 diabetes, hypertension, hyperlipidemia, prior NSVT and symptomatic PVCs treated with beta-blockers , presented in the ED with generalized weakness.  Patient was started on Jardiance 2 weeks ago which made him feel so bad,  he stopped taking it, then he presented with nausea, vomiting and generalized weakness.  He reports he was working in his garden when he became very tired and had to sit down and developed short of breath but denies any chest pain.  He is found to have slightly elevated serum creatinine from his baseline, EKG shows some abnormalities multiple PVCs.  Cardiology was consulted, recommended to repeat echocardiogram and would not pursue another ischemic evaluation unless there is any change in his systolic function.  Continue Coreg twice.  He's now s/p stress test, area of reduced perfusion concerning for infarct.  Planning for cath 8/25 AM.  Assessment & Plan:   Principal Problem:   AKI (acute kidney injury) (HCC) Active Problems:   DM2 (diabetes mellitus, type 2) (HCC)   Essential hypertension   NSVT (nonsustained ventricular tachycardia) (HCC)   Syncope   Hypotension   Chest pressure   Arrhythmia   SOB (shortness of breath)  Septic Thrombophlebitis  Sepsis  Strep Pyogenes Bacteremia  Superficial Vein Thrombosis of L Cephalic Vein LUE [place] notable for superficial venous thrombosis - continue heparin for now (per discussion with [place] tech, clot likely >5 cm).  Will consider ppx treatment vs treatment (will discuss further with family) - not great guidance for upper extremity SVT.  Will refer to follow up with vascular outpatient (discussed informally).   Fever again 8/26 Strep pyogenes bacteremia Repeat blood cultures pending ID  c/s, appreciate recs -> penicillin for now, amoxicllin x 14 days (500 mg TID) at the time of discharge.  Need 24 hrs afebrile prior to discharge.  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%, mildly decreased function - no RWMA Myoview with large area of reduced perfusion at rest and stress in inferior and inferolateral wall from apex to base, concerning for infarct Patient has multiple PVCs on EKG S/p LHC without significant obstructive coronary disease in the major arteries -> recommending risk factor modification to prevent progression of mild coronary atherosclerosis -> EP evaluation  Transition to toprol  Imdur added, hydral 10 mg TID EP eval, appreciate recommendations - recommending resuming beta blocker, hydration - no further EP w/u planned Follow TSH wnl Cardiology now signed off -> ASA, hydral, imdur, toprol, crestor - holding on diuretics at this time - recommending ziopatch x2 weeks and bmet in 1 week - outpatient f/u with Dr. Johney Frame in 4 weeks  Acute kidney injury: Presented with serum creatinine of 1.7 increased from his baseline 1.0 Very gradually improving UA bland Renal US normal  Continue IVF Holding ARB  Hypertension: Continue Coreg, Norvasc . resume valsartan once renal function improves.   DM2: Hold oral diabetic medications Regular insulin sliding scale   Hyperlipidemia :  continue Crestor (increased to 40)   RUQ pain LFTs normal, ultrasound unremarkable.  DVT prophylaxis: heparin  Code Status: full  Family Communication: none at bedside Disposition:   Status is: Inpatient  Remains inpatient appropriate because:Inpatient level of care appropriate due to severity  Streptococcus species DETECTED (Karl Peters) NOT DETECTED Final    Comment: CRITICAL RESULT CALLED TO, READ BACK BY AND VERIFIED WITH: PHARMD T.DANTE 10/11/20@21 :49 BY TW    Streptococcus agalactiae NOT DETECTED NOT DETECTED  Final   Streptococcus pneumoniae NOT DETECTED NOT DETECTED Final   Streptococcus pyogenes DETECTED (Karl Peters) NOT DETECTED Final    Comment: CRITICAL RESULT CALLED TO, READ BACK BY AND VERIFIED WITH: PHARMD T.DANTE 10/11/20@21 :49 BY TW    Karl Peters.calcoaceticus-baumannii NOT DETECTED NOT DETECTED Final   Bacteroides fragilis NOT DETECTED NOT DETECTED Final   Enterobacterales NOT DETECTED NOT DETECTED Final   Enterobacter cloacae complex NOT DETECTED NOT DETECTED Final   Escherichia coli NOT DETECTED NOT DETECTED Final   Klebsiella aerogenes NOT DETECTED NOT DETECTED Final   Klebsiella oxytoca NOT DETECTED NOT DETECTED Final   Klebsiella pneumoniae NOT DETECTED NOT DETECTED Final   Proteus species NOT DETECTED NOT DETECTED Final   Salmonella species NOT DETECTED NOT DETECTED Final   Serratia marcescens NOT DETECTED NOT DETECTED Final   Haemophilus influenzae NOT DETECTED NOT DETECTED Final   Neisseria meningitidis NOT DETECTED NOT DETECTED Final   Pseudomonas aeruginosa NOT DETECTED NOT DETECTED Final   Stenotrophomonas maltophilia NOT DETECTED NOT DETECTED Final   Candida albicans NOT DETECTED NOT DETECTED Final   Candida auris NOT DETECTED NOT DETECTED Final   Candida glabrata NOT DETECTED NOT DETECTED Final   Candida krusei NOT DETECTED NOT DETECTED Final   Candida parapsilosis NOT DETECTED NOT DETECTED Final   Candida tropicalis NOT DETECTED NOT DETECTED Final   Cryptococcus neoformans/gattii NOT DETECTED NOT DETECTED Final    Comment: Performed at [facility], [address], [place], [place] [address]         Radiology Studies: DG CHEST PORT 1 VIEW  Result Date: 10/11/2020 CLINICAL DATA:  Fever EXAM: PORTABLE CHEST 1 VIEW COMPARISON:  Portable exam 0832 hours compared to 10/07/2020 FINDINGS: Normal heart size, mediastinal contours, and pulmonary vascularity. Mild atelectasis at RIGHT base. Lungs otherwise clear. No infiltrate, pleural effusion, or pneumothorax. Prior cervical spine  fusion. IMPRESSION: Mild RIGHT basilar atelectasis. Electronically Signed   By: Ulyses Southward M.D.   On: 10/11/2020 11:03   VAS [place] UPPER EXTREMITY VENOUS DUPLEX  Result Date: 10/11/2020 UPPER VENOUS STUDY  Patient Name:  Karl Peters  Date of Exam:   10/11/2020 Medical Rec #: [id number]      Accession #:    [id number] Date of Birth: 17-Feb-1940     Patient Gender: M Patient Age:   32 years Exam Location:  [facility] Procedure:      VAS [place] UPPER EXTREMITY VENOUS DUPLEX Referring Phys: Willet Schleifer POWELL JR --------------------------------------------------------------------------------  Indications: pain, edema, and erythema at recent IV site. Comparison Study: No prior study Performing Technologist: Gertie Fey MHA, RDMS, RVT, RDCS  Examination Guidelines: Shundra Wirsing complete evaluation includes B-mode imaging, spectral Doppler, color Doppler, and power Doppler as needed of all accessible portions of each vessel. Bilateral testing is considered an integral part of Sarina Robleto complete examination. Limited examinations for reoccurring indications may be performed as noted.  Right Findings: +----------+------------+---------+-----------+----------+-------+ RIGHT     CompressiblePhasicitySpontaneousPropertiesSummary +----------+------------+---------+-----------+----------+-------+ Subclavian               Yes       Yes                      +----------+------------+---------+-----------+----------+-------+  Left Findings: +----------+------------+---------+-----------+----------+-------+ LEFT      CompressiblePhasicitySpontaneousPropertiesSummary +----------+------------+---------+-----------+----------+-------+ IJV  Streptococcus species DETECTED (Karl Peters) NOT DETECTED Final    Comment: CRITICAL RESULT CALLED TO, READ BACK BY AND VERIFIED WITH: PHARMD T.DANTE 10/11/20@21 :49 BY TW    Streptococcus agalactiae NOT DETECTED NOT DETECTED  Final   Streptococcus pneumoniae NOT DETECTED NOT DETECTED Final   Streptococcus pyogenes DETECTED (Karl Peters) NOT DETECTED Final    Comment: CRITICAL RESULT CALLED TO, READ BACK BY AND VERIFIED WITH: PHARMD T.DANTE 10/11/20@21 :49 BY TW    Karl Peters.calcoaceticus-baumannii NOT DETECTED NOT DETECTED Final   Bacteroides fragilis NOT DETECTED NOT DETECTED Final   Enterobacterales NOT DETECTED NOT DETECTED Final   Enterobacter cloacae complex NOT DETECTED NOT DETECTED Final   Escherichia coli NOT DETECTED NOT DETECTED Final   Klebsiella aerogenes NOT DETECTED NOT DETECTED Final   Klebsiella oxytoca NOT DETECTED NOT DETECTED Final   Klebsiella pneumoniae NOT DETECTED NOT DETECTED Final   Proteus species NOT DETECTED NOT DETECTED Final   Salmonella species NOT DETECTED NOT DETECTED Final   Serratia marcescens NOT DETECTED NOT DETECTED Final   Haemophilus influenzae NOT DETECTED NOT DETECTED Final   Neisseria meningitidis NOT DETECTED NOT DETECTED Final   Pseudomonas aeruginosa NOT DETECTED NOT DETECTED Final   Stenotrophomonas maltophilia NOT DETECTED NOT DETECTED Final   Candida albicans NOT DETECTED NOT DETECTED Final   Candida auris NOT DETECTED NOT DETECTED Final   Candida glabrata NOT DETECTED NOT DETECTED Final   Candida krusei NOT DETECTED NOT DETECTED Final   Candida parapsilosis NOT DETECTED NOT DETECTED Final   Candida tropicalis NOT DETECTED NOT DETECTED Final   Cryptococcus neoformans/gattii NOT DETECTED NOT DETECTED Final    Comment: Performed at [facility], [address], [place], [place] [address]         Radiology Studies: DG CHEST PORT 1 VIEW  Result Date: 10/11/2020 CLINICAL DATA:  Fever EXAM: PORTABLE CHEST 1 VIEW COMPARISON:  Portable exam 0832 hours compared to 10/07/2020 FINDINGS: Normal heart size, mediastinal contours, and pulmonary vascularity. Mild atelectasis at RIGHT base. Lungs otherwise clear. No infiltrate, pleural effusion, or pneumothorax. Prior cervical spine  fusion. IMPRESSION: Mild RIGHT basilar atelectasis. Electronically Signed   By: Ulyses Southward M.D.   On: 10/11/2020 11:03   VAS [place] UPPER EXTREMITY VENOUS DUPLEX  Result Date: 10/11/2020 UPPER VENOUS STUDY  Patient Name:  Karl Peters  Date of Exam:   10/11/2020 Medical Rec #: [id number]      Accession #:    [id number] Date of Birth: 17-Feb-1940     Patient Gender: M Patient Age:   32 years Exam Location:  [facility] Procedure:      VAS [place] UPPER EXTREMITY VENOUS DUPLEX Referring Phys: Willet Schleifer POWELL JR --------------------------------------------------------------------------------  Indications: pain, edema, and erythema at recent IV site. Comparison Study: No prior study Performing Technologist: Gertie Fey MHA, RDMS, RVT, RDCS  Examination Guidelines: Shundra Wirsing complete evaluation includes B-mode imaging, spectral Doppler, color Doppler, and power Doppler as needed of all accessible portions of each vessel. Bilateral testing is considered an integral part of Sarina Robleto complete examination. Limited examinations for reoccurring indications may be performed as noted.  Right Findings: +----------+------------+---------+-----------+----------+-------+ RIGHT     CompressiblePhasicitySpontaneousPropertiesSummary +----------+------------+---------+-----------+----------+-------+ Subclavian               Yes       Yes                      +----------+------------+---------+-----------+----------+-------+  Left Findings: +----------+------------+---------+-----------+----------+-------+ LEFT      CompressiblePhasicitySpontaneousPropertiesSummary +----------+------------+---------+-----------+----------+-------+ IJV  Streptococcus species DETECTED (Karl Peters) NOT DETECTED Final    Comment: CRITICAL RESULT CALLED TO, READ BACK BY AND VERIFIED WITH: PHARMD T.DANTE 10/11/20@21 :49 BY TW    Streptococcus agalactiae NOT DETECTED NOT DETECTED  Final   Streptococcus pneumoniae NOT DETECTED NOT DETECTED Final   Streptococcus pyogenes DETECTED (Karl Peters) NOT DETECTED Final    Comment: CRITICAL RESULT CALLED TO, READ BACK BY AND VERIFIED WITH: PHARMD T.DANTE 10/11/20@21 :49 BY TW    Karl Peters.calcoaceticus-baumannii NOT DETECTED NOT DETECTED Final   Bacteroides fragilis NOT DETECTED NOT DETECTED Final   Enterobacterales NOT DETECTED NOT DETECTED Final   Enterobacter cloacae complex NOT DETECTED NOT DETECTED Final   Escherichia coli NOT DETECTED NOT DETECTED Final   Klebsiella aerogenes NOT DETECTED NOT DETECTED Final   Klebsiella oxytoca NOT DETECTED NOT DETECTED Final   Klebsiella pneumoniae NOT DETECTED NOT DETECTED Final   Proteus species NOT DETECTED NOT DETECTED Final   Salmonella species NOT DETECTED NOT DETECTED Final   Serratia marcescens NOT DETECTED NOT DETECTED Final   Haemophilus influenzae NOT DETECTED NOT DETECTED Final   Neisseria meningitidis NOT DETECTED NOT DETECTED Final   Pseudomonas aeruginosa NOT DETECTED NOT DETECTED Final   Stenotrophomonas maltophilia NOT DETECTED NOT DETECTED Final   Candida albicans NOT DETECTED NOT DETECTED Final   Candida auris NOT DETECTED NOT DETECTED Final   Candida glabrata NOT DETECTED NOT DETECTED Final   Candida krusei NOT DETECTED NOT DETECTED Final   Candida parapsilosis NOT DETECTED NOT DETECTED Final   Candida tropicalis NOT DETECTED NOT DETECTED Final   Cryptococcus neoformans/gattii NOT DETECTED NOT DETECTED Final    Comment: Performed at [facility], [address], [place], [place] [address]         Radiology Studies: DG CHEST PORT 1 VIEW  Result Date: 10/11/2020 CLINICAL DATA:  Fever EXAM: PORTABLE CHEST 1 VIEW COMPARISON:  Portable exam 0832 hours compared to 10/07/2020 FINDINGS: Normal heart size, mediastinal contours, and pulmonary vascularity. Mild atelectasis at RIGHT base. Lungs otherwise clear. No infiltrate, pleural effusion, or pneumothorax. Prior cervical spine  fusion. IMPRESSION: Mild RIGHT basilar atelectasis. Electronically Signed   By: Ulyses Southward M.D.   On: 10/11/2020 11:03   VAS [place] UPPER EXTREMITY VENOUS DUPLEX  Result Date: 10/11/2020 UPPER VENOUS STUDY  Patient Name:  Karl Peters  Date of Exam:   10/11/2020 Medical Rec #: [id number]      Accession #:    [id number] Date of Birth: 17-Feb-1940     Patient Gender: M Patient Age:   32 years Exam Location:  [facility] Procedure:      VAS [place] UPPER EXTREMITY VENOUS DUPLEX Referring Phys: Willet Schleifer POWELL JR --------------------------------------------------------------------------------  Indications: pain, edema, and erythema at recent IV site. Comparison Study: No prior study Performing Technologist: Gertie Fey MHA, RDMS, RVT, RDCS  Examination Guidelines: Shundra Wirsing complete evaluation includes B-mode imaging, spectral Doppler, color Doppler, and power Doppler as needed of all accessible portions of each vessel. Bilateral testing is considered an integral part of Sarina Robleto complete examination. Limited examinations for reoccurring indications may be performed as noted.  Right Findings: +----------+------------+---------+-----------+----------+-------+ RIGHT     CompressiblePhasicitySpontaneousPropertiesSummary +----------+------------+---------+-----------+----------+-------+ Subclavian               Yes       Yes                      +----------+------------+---------+-----------+----------+-------+  Left Findings: +----------+------------+---------+-----------+----------+-------+ LEFT      CompressiblePhasicitySpontaneousPropertiesSummary +----------+------------+---------+-----------+----------+-------+ IJV

## 2020-10-12 NOTE — Progress Notes (Signed)
ANTICOAGULATION CONSULT NOTE  Pharmacy Consult for heparin Indication: acute superficial vein thrombosis involving the left cephalic vein.   Allergies  Allergen Reactions   Jardiance [Empagliflozin] Nausea Only    Patient Measurements: Height: 5\' 8"  (172.7 cm) Weight: 118.3 kg (260 lb 12.8 oz) IBW/kg (Calculated) : 68.4 Heparin Dosing Weight: 95kg  Vital Signs: Temp: 98.7 F (37.1 C) (08/27 0626) Temp Source: Oral (08/27 0626) BP: 117/59 (08/27 0957) Pulse Rate: 80 (08/27 0957)  Labs: Recent Labs    10/10/20 0257 10/10/20 0928 10/11/20 0201 10/11/20 1821 10/12/20 0330 10/12/20 1149  HGB 13.1  --  13.3  --   --   --   HCT 40.6  --  41.1  --   --   --   PLT 219  --  196  --   --   --   HEPARINUNFRC  --   --   --  0.24* 0.48 0.39  CREATININE 1.70* 1.49* 1.43*  --   --   --      Estimated Creatinine Clearance: 53.2 mL/min (A) (by C-G formula based on SCr of 1.43 mg/dL (H)).   Assessment: Karl Peters is a 80 y.o. male admitted on 10/07/2020 with generalized weakness. Started on therapeutic heparin for concern for upper extremity dvt.  Upper extremity 10/09/2020 done 8/26 found no evidence of DVT but findings consistent with acute superficial vein thrombosis involving the left cephalic vein.   Per earlier confirmation with RN, heparin gtt has not been interrupted. No s/sx of bleeding or issues with infusion.   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 Will continue at this rate and check heparin levels daily with AM labs.  Goal of Therapy:  Heparin level 0.3-0.7 units/ml Monitor platelets by anticoagulation protocol: Yes   Plan:  Continue heparin gtt to 1650 units/hr Recheck HL with daily AM labs Monitor CBC and s/sx bleeding F/U [facility] plan   Thank you for allowing pharmacy to be a part of this patient's care.  [facility], PharmD Clinical Pharmacist

## 2020-10-12 NOTE — Consult Note (Signed)
[facility]       Reason for Consult: bacteremia    Referring Physician: Dr. Lowell Guitar  Principal Problem:   AKI (acute kidney injury) [facility]) Active Problems:   DM2 (diabetes mellitus, type 2) (HCC)   Essential hypertension   NSVT (nonsustained ventricular tachycardia) (HCC)   Syncope   Hypotension   Chest pressure   Arrhythmia   SOB (shortness of breath)    aspirin  81 mg Oral Daily   buPROPion  300 mg Oral q morning   hydrALAZINE  10 mg Oral Q8H   insulin aspart  0-15 Units Subcutaneous TID WC   insulin aspart  0-5 Units Subcutaneous QHS   isosorbide mononitrate  15 mg Oral Daily   metoprolol succinate  50 mg Oral Daily   montelukast  10 mg Oral q morning   pantoprazole  40 mg Oral q morning   rosuvastatin  40 mg Oral q morning   sodium chloride flush  3 mL Intravenous Q12H   sodium chloride flush  3 mL Intravenous Q12H   vitamin B-12  1,000 mcg Oral q morning    Recommendations:  Penicillin IV Amoxicillin 500 mg TID at discharge Continue amoxicillin for 14 more days at discharge once afebrile 24 hours  Assessment: He had a purulent superficial infection with a notable superficial thrombophlebitis at the site of a PIV and now with group A Strep bacteremia in 3/4 bottles associated with fever and leukocytosis on 8/25.  Indurated areas is improving per the patient and Tmax overnight of 100.9.  He will need a bit longer treatment with the clot noted on exam.   I will have him follow up with me after discharge on 9/9 at 1:45.    Antibiotics: Vancomycin, ceftriaxone, clindamycin  HPI: Karl Peters is a 80 y.o. male with a history of DM, HTN, HLD and known NSVT who came in with generalized weakness after starting Jardiance about 2 weeks prior.  He had some shortness of breath and evaluated by cardiology and underwent cardiac catheterization on 8/25 with no significant findings of concern.  He then developed a fever that evening and his  peripheral IV was found to have some purulence and it was removed and blood cultures sent, which are now positive as above.  He was started on empiric treatment and now on clindamycin and ceftriaxone.     Review of Systems:  Constitutional: negative for chills Gastrointestinal: negative for nausea and diarrhea Integument/breast: negative for rash All other systems reviewed and are negative    Past Medical History:  Diagnosis Date   Diabetes mellitus    Dyslipidemia    Hx of cardiac catheterization    a. normal cors by cath in 1998 and 2010   Hx of echocardiogram    Echocardiogram 06/10/12: Normal LV wall thickness, EF 50-55%, grade 1 diastolic dysfunction, mild LAE   Hypertension    Obesity    Ventricular tachycardia (HCC)    and symptomatic PVCs - Rx with beta blocker    Social History   Tobacco Use   Smoking status: Never   Smokeless tobacco: Never  Substance Use Topics   Alcohol use: No    Family History  Problem Relation Age of Onset   Heart attack Father 38       Died at age 76 from heart attack   Heart failure Mother 3       Died from heart failure   Lymphoma Mother    Diabetes  Mother    Diabetes Mellitus II Maternal Grandmother    Other Maternal Grandfather        head injury   Prostate cancer Paternal Grandfather    Depression Daughter    Rheum arthritis Daughter    Anxiety disorder Daughter     Allergies  Allergen Reactions   Jardiance [Empagliflozin] Nausea Only    Physical Exam: Constitutional: in no apparent distress  Vitals:   10/12/20 0626 10/12/20 0957  BP: 102/63 (!) 117/59  Pulse: 75 80  Resp: 16   Temp: 98.7 F (37.1 C)   SpO2: 97%    EYES: anicteric ENMT: no thrush Cardiovascular: Cor RRR Respiratory: clear; Musculoskeletal: left arm antecubital area with induration, erythema, no tenderness Skin: negatives: no rash Neuro: non-focal  Lab Results  Component Value Date   WBC 13.3 (H) 10/11/2020   HGB 13.3 10/11/2020   HCT  41.1 10/11/2020   MCV 89.3 10/11/2020   PLT 196 10/11/2020    Lab Results  Component Value Date   CREATININE 1.43 (H) 10/11/2020   BUN 20 10/11/2020   NA 134 (L) 10/11/2020   K 4.2 10/11/2020   CL 104 10/11/2020   CO2 22 10/11/2020    Lab Results  Component Value Date   ALT 10 10/10/2020   AST 12 (L) 10/10/2020   ALKPHOS 74 10/10/2020     Microbiology: Recent Results (from the past 240 hour(s))  Resp Panel by RT-PCR (Flu A&B, Covid) Nasopharyngeal Swab     Status: None   Collection Time: 10/07/20  4:47 PM   Specimen: Nasopharyngeal Swab; Nasopharyngeal(NP) swabs in vial transport medium  Result Value Ref Range Status   SARS Coronavirus 2 by RT PCR NEGATIVE NEGATIVE Final    Comment: (NOTE) SARS-CoV-2 target nucleic acids are NOT DETECTED.  The SARS-CoV-2 RNA is generally detectable in upper respiratory specimens during the acute phase of infection. The lowest concentration of SARS-CoV-2 viral copies this assay can detect is 138 copies/mL. A negative result does not preclude SARS-Cov-2 infection and should not be used as the sole basis for treatment or other patient management decisions. A negative result may occur with  improper specimen collection/handling, submission of specimen other than nasopharyngeal swab, presence of viral mutation(s) within the areas targeted by this assay, and inadequate number of viral copies(<138 copies/mL). A negative result must be combined with clinical observations, patient history, and epidemiological information. The expected result is Negative.  Fact Sheet for Patients:  BloggerCourse.com  Fact Sheet for Healthcare Providers:  SeriousBroker.it  This test is no t yet approved or cleared by the [place] FDA and  has been authorized for detection and/or diagnosis of SARS-CoV-2 by FDA under an Emergency Use Authorization (EUA). This EUA will remain  in effect (meaning this test can  be used) for the duration of the COVID-19 declaration under Section 564(b)(1) of the Act, 21 U.S.C.section 360bbb-3(b)(1), unless the authorization is terminated  or revoked sooner.       Influenza A by PCR NEGATIVE NEGATIVE Final   Influenza B by PCR NEGATIVE NEGATIVE Final    Comment: (NOTE) The Xpert Xpress SARS-CoV-2/FLU/RSV plus assay is intended as an aid in the diagnosis of influenza from Nasopharyngeal swab specimens and should not be used as a sole basis for treatment. Nasal washings and aspirates are unacceptable for Xpert Xpress SARS-CoV-2/FLU/RSV testing.  Fact Sheet for Patients: BloggerCourse.com  Fact Sheet for Healthcare Providers: SeriousBroker.it  This test is not yet approved or cleared by the [place] FDA  and has been authorized for detection and/or diagnosis of SARS-CoV-2 by FDA under an Emergency Use Authorization (EUA). This EUA will remain in effect (meaning this test can be used) for the duration of the COVID-19 declaration under Section 564(b)(1) of the Act, 21 U.S.C. section 360bbb-3(b)(1), unless the authorization is terminated or revoked.  Performed at [facility], [address], [place], [place] [address]   Culture, blood (routine x 2)     Status: None (Preliminary result)   Collection Time: 10/10/20 10:24 PM   Specimen: BLOOD  Result Value Ref Range Status   Specimen Description BLOOD LEFT HAND  Final   Special Requests   Final    BOTTLES DRAWN AEROBIC AND ANAEROBIC Blood Culture adequate volume   Culture  Setup Time   Final    GRAM POSITIVE COCCI ANAEROBIC BOTTLE ONLY CRITICAL VALUE NOTED.  VALUE IS CONSISTENT WITH PREVIOUSLY REPORTED AND CALLED VALUE. Performed at [facility], [address], [place], [place] [address]    Culture GRAM POSITIVE COCCI  Final   Report Status PENDING  Incomplete  Culture, blood (routine x 2)     Status: Abnormal (Preliminary result)    Collection Time: 10/10/20 10:24 PM   Specimen: BLOOD  Result Value Ref Range Status   Specimen Description BLOOD RIGHT HAND  Final   Special Requests   Final    BOTTLES DRAWN AEROBIC AND ANAEROBIC Blood Culture adequate volume   Culture  Setup Time   Final    GRAM POSITIVE COCCI IN BOTH AEROBIC AND ANAEROBIC BOTTLES CRITICAL RESULT CALLED TO, READ BACK BY AND VERIFIED WITH: PHARMD T. DANTE 10/11/20@21 :49 BY TW    Culture (A)  Final    GROUP A STREP (S.PYOGENES) ISOLATED SUSCEPTIBILITIES TO FOLLOW Performed at [facility], [address] [place], [place] [address]    Report Status PENDING  Incomplete  Blood Culture ID Panel (Reflexed)     Status: Abnormal   Collection Time: 10/10/20 10:24 PM  Result Value Ref Range Status   Enterococcus faecalis NOT DETECTED NOT DETECTED Final   Enterococcus Faecium NOT DETECTED NOT DETECTED Final   Listeria monocytogenes NOT DETECTED NOT DETECTED Final   Staphylococcus species NOT DETECTED NOT DETECTED Final   Staphylococcus aureus (BCID) NOT DETECTED NOT DETECTED Final   Staphylococcus epidermidis NOT DETECTED NOT DETECTED Final   Staphylococcus lugdunensis NOT DETECTED NOT DETECTED Final   Streptococcus species DETECTED (A) NOT DETECTED Final    Comment: CRITICAL RESULT CALLED TO, READ BACK BY AND VERIFIED WITH: PHARMD T.DANTE 10/11/20@21 :49 BY TW    Streptococcus agalactiae NOT DETECTED NOT DETECTED Final   Streptococcus pneumoniae NOT DETECTED NOT DETECTED Final   Streptococcus pyogenes DETECTED (A) NOT DETECTED Final    Comment: CRITICAL RESULT CALLED TO, READ BACK BY AND VERIFIED WITH: PHARMD T.DANTE 10/11/20@21 :49 BY TW    A.calcoaceticus-baumannii NOT DETECTED NOT DETECTED Final   Bacteroides fragilis NOT DETECTED NOT DETECTED Final   Enterobacterales NOT DETECTED NOT DETECTED Final   Enterobacter cloacae complex NOT DETECTED NOT DETECTED Final   Escherichia coli NOT DETECTED NOT DETECTED Final   Klebsiella aerogenes NOT  DETECTED NOT DETECTED Final   Klebsiella oxytoca NOT DETECTED NOT DETECTED Final   Klebsiella pneumoniae NOT DETECTED NOT DETECTED Final   Proteus species NOT DETECTED NOT DETECTED Final   Salmonella species NOT DETECTED NOT DETECTED Final   Serratia marcescens NOT DETECTED NOT DETECTED Final   Haemophilus influenzae NOT DETECTED NOT DETECTED Final   Neisseria  meningitidis NOT DETECTED NOT DETECTED Final   Pseudomonas aeruginosa NOT DETECTED NOT DETECTED Final   Stenotrophomonas maltophilia NOT DETECTED NOT DETECTED Final   Candida albicans NOT DETECTED NOT DETECTED Final   Candida auris NOT DETECTED NOT DETECTED Final   Candida glabrata NOT DETECTED NOT DETECTED Final   Candida krusei NOT DETECTED NOT DETECTED Final   Candida parapsilosis NOT DETECTED NOT DETECTED Final   Candida tropicalis NOT DETECTED NOT DETECTED Final   Cryptococcus neoformans/gattii NOT DETECTED NOT DETECTED Final    Comment: Performed at [facility], [address], [place], [place] [address]    Gardiner Barefoot, MD [facility] [email or web address] 10/12/2020, 10:43 AM

## 2020-10-13 ENCOUNTER — Inpatient Hospital Stay (HOSPITAL_COMMUNITY): Payer: Medicare Other

## 2020-10-13 DIAGNOSIS — M7989 Other specified soft tissue disorders: Secondary | ICD-10-CM

## 2020-10-13 DIAGNOSIS — L538 Other specified erythematous conditions: Secondary | ICD-10-CM

## 2020-10-13 DIAGNOSIS — N179 Acute kidney failure, unspecified: Secondary | ICD-10-CM | POA: Diagnosis not present

## 2020-10-13 LAB — CBC
HCT: 35.4 % — ABNORMAL LOW (ref 39.0–52.0)
Hemoglobin: 11.8 g/dL — ABNORMAL LOW (ref 13.0–17.0)
MCH: 29.5 pg (ref 26.0–34.0)
MCHC: 33.3 g/dL (ref 30.0–36.0)
MCV: 88.5 fL (ref 80.0–100.0)
Platelets: 163 10*3/uL (ref 150–400)
RBC: 4 MIL/uL — ABNORMAL LOW (ref 4.22–5.81)
RDW: 13.9 % (ref 11.5–15.5)
WBC: 8.6 10*3/uL (ref 4.0–10.5)
nRBC: 0 % (ref 0.0–0.2)

## 2020-10-13 LAB — COMPREHENSIVE METABOLIC PANEL
ALT: 23 U/L (ref 0–44)
AST: 24 U/L (ref 15–41)
Albumin: 2.6 g/dL — ABNORMAL LOW (ref 3.5–5.0)
Alkaline Phosphatase: 62 U/L (ref 38–126)
Anion gap: 10 (ref 5–15)
BUN: 23 mg/dL (ref 8–23)
CO2: 20 mmol/L — ABNORMAL LOW (ref 22–32)
Calcium: 8.5 mg/dL — ABNORMAL LOW (ref 8.9–10.3)
Chloride: 101 mmol/L (ref 98–111)
Creatinine, Ser: 1.56 mg/dL — ABNORMAL HIGH (ref 0.61–1.24)
GFR, Estimated: 45 mL/min — ABNORMAL LOW (ref >60)
Glucose, Bld: 137 mg/dL — ABNORMAL HIGH (ref 70–99)
Potassium: 4 mmol/L (ref 3.5–5.1)
Sodium: 131 mmol/L — ABNORMAL LOW (ref 135–145)
Total Bilirubin: 0.8 mg/dL (ref 0.3–1.2)
Total Protein: 5.7 g/dL — ABNORMAL LOW (ref 6.5–8.1)

## 2020-10-13 LAB — CULTURE, BLOOD (ROUTINE X 2)
Special Requests: ADEQUATE
Special Requests: ADEQUATE

## 2020-10-13 LAB — MAGNESIUM: Magnesium: 1.7 mg/dL (ref 1.7–2.4)

## 2020-10-13 LAB — GLUCOSE, CAPILLARY
Glucose-Capillary: 140 mg/dL — ABNORMAL HIGH (ref 70–99)
Glucose-Capillary: 159 mg/dL — ABNORMAL HIGH (ref 70–99)
Glucose-Capillary: 133 mg/dL — ABNORMAL HIGH (ref 70–99)
Glucose-Capillary: 166 mg/dL — ABNORMAL HIGH (ref 70–99)

## 2020-10-13 LAB — PHOSPHORUS: Phosphorus: 2.1 mg/dL — ABNORMAL LOW (ref 2.5–4.6)

## 2020-10-13 LAB — HEPARIN LEVEL (UNFRACTIONATED): Heparin Unfractionated: 0.24 IU/mL — ABNORMAL LOW (ref 0.30–0.70)

## 2020-10-13 MED ORDER — ENOXAPARIN SODIUM 120 MG/0.8ML IJ SOSY
120.0000 mg | PREFILLED_SYRINGE | Freq: Two times a day (BID) | INTRAMUSCULAR | Status: DC
  Administered 2020-10-13 – 2020-10-14 (×3): 120 mg via SUBCUTANEOUS
  Filled 2020-10-13 (×5): qty 0.8

## 2020-10-13 NOTE — Progress Notes (Addendum)
PROGRESS NOTE    Karl Peters  QPR:[id number] DOB: [birth year] DOA: 10/07/2020 PCP: Elizabeth Palau, FNP   Chief Complaint  Patient presents with   Weakness   Nausea   Brief Narrative:  This 80 years old male with PMH significant for type 2 diabetes, hypertension, hyperlipidemia, prior NSVT and symptomatic PVCs treated with beta-blockers , presented in the ED with generalized weakness.  Patient was started on Jardiance 2 weeks ago which made him feel so bad,  he stopped taking it, then he presented with nausea, vomiting and generalized weakness.  He reports he was working in his garden when he became very tired and had to sit down and developed short of breath but denies any chest pain.  He is found to have slightly elevated serum creatinine from his baseline, EKG shows some abnormalities multiple PVCs.  Cardiology was consulted, recommended to repeat echocardiogram and would not pursue another ischemic evaluation unless there is any change in his systolic function.  Continue Coreg twice.  He's now s/p stress test, area of reduced perfusion concerning for infarct.  Planning for cath 8/25 AM.  Assessment & Plan:   Principal Problem:   AKI (acute kidney injury) (HCC) Active Problems:   DM2 (diabetes mellitus, type 2) (HCC)   Essential hypertension   NSVT (nonsustained ventricular tachycardia) (HCC)   Syncope   Hypotension   Chest pressure   Arrhythmia   SOB (shortness of breath)  Septic Thrombophlebitis  Sepsis  Strep Pyogenes Bacteremia  Superficial Vein Thrombosis of L Cephalic Vein LUE [place] notable for superficial venous thrombosis - continue lovenox (change to ease IV needs) for now (per discussion with [place] tech, clot likely >5 cm).  Given worsening swelling below, will plan for treatment - not great guidance for upper extremity SVT.  Will refer to follow up with vascular outpatient (discussed informally).   Last true fever 8/26 (but elevated temp, 99.9 on 8/27) Strep pyogenes  bacteremia Repeat blood cultures NG Worse LUE swelling today, follow repeat US and CT scan  ID c/s, appreciate recs -> penicillin for now, amoxicllin x 14 days (500 mg TID) at the time of discharge.  Need 24 hrs afebrile prior to discharge.  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%, mildly decreased function - no RWMA Myoview with large area of reduced perfusion at rest and stress in inferior and inferolateral wall from apex to base, concerning for infarct Patient has multiple PVCs on EKG S/p LHC without significant obstructive coronary disease in the major arteries -> recommending risk factor modification to prevent progression of mild coronary atherosclerosis -> EP evaluation  Transition to toprol  Imdur added, hydral 10 mg TID EP eval, appreciate recommendations - recommending resuming beta blocker, hydration - no further EP w/u planned Follow TSH wnl Cardiology now signed off -> ASA, hydral, imdur, toprol, crestor - holding on diuretics at this time - recommending ziopatch x2 weeks and bmet in 1 week - outpatient f/u with Dr. Johney Frame in 4 weeks  Acute kidney injury: Presented with serum creatinine of 1.7 increased from his baseline 1.0 Fluctuating  UA bland Renal US normal  Continue IVF Holding ARB  Hypertension: Continue Coreg, Norvasc . resume valsartan once renal function improves.   DM2: Hold oral diabetic medications Regular insulin sliding scale   Hyperlipidemia :  continue Crestor (increased to 40)   RUQ pain LFTs normal, ultrasound unremarkable.  DVT prophylaxis: heparin  Code Status: full  Family Communication: none at  Specimen Description BLOOD RIGHT HAND  Final   Special Requests   Final    BOTTLES DRAWN AEROBIC AND ANAEROBIC Blood Culture adequate volume   Culture   Final    NO GROWTH 1 DAY Performed at [facility], [address], [place], [place] [address]    Report Status PENDING  Incomplete  Culture, blood (routine x 2)     Status: None (Preliminary result)   Collection Time: 10/12/20  8:04 AM   Specimen: BLOOD LEFT HAND  Result Value Ref Range Status   Specimen Description BLOOD LEFT HAND  Final   Special Requests   Final    BOTTLES DRAWN AEROBIC AND ANAEROBIC Blood Culture adequate volume   Culture   Final    NO GROWTH 1 DAY Performed at [facility], [address] [place], [place] [address]    Report Status PENDING  Incomplete         Radiology Studies: VAS [place] UPPER EXTREMITY  VENOUS DUPLEX  Result Date: 10/13/2020 UPPER VENOUS STUDY  Patient Name:  JORY TANGUMA  Date of Exam:   10/13/2020 Medical Rec #: [id number]      Accession #:    [id number] Date of Birth: [birth year]     Patient Gender: M Patient Age:   68 years Exam Location:  [facility] Procedure:      VAS [place] UPPER EXTREMITY VENOUS DUPLEX Referring Phys: Oley Lahaie POWELL JR --------------------------------------------------------------------------------  Indications: Prior cephalic clot, worsening swelling and erythema Comparison Study: 10-11-2020 Prior left upper extremity venous study was                   positive for acute superficial thrombosis involving the                   cephalic vein. Performing Technologist: Jean Rosenthal RDMS, RVT  Examination Guidelines: Brandilee Pies complete evaluation includes B-mode imaging, spectral Doppler, color Doppler, and power Doppler as needed of all accessible portions of each vessel. Bilateral testing is considered an integral part of Aaylah Pokorny complete examination. Limited examinations for reoccurring indications may be performed as noted.  Left Findings: +----------+------------+---------+-----------+----------+-------+ LEFT      CompressiblePhasicitySpontaneousPropertiesSummary +----------+------------+---------+-----------+----------+-------+ IJV           Full       Yes       Yes                      +----------+------------+---------+-----------+----------+-------+ Subclavian    Full       Yes       Yes                      +----------+------------+---------+-----------+----------+-------+ Axillary      Full       Yes       Yes                      +----------+------------+---------+-----------+----------+-------+ Brachial      Full                                          +----------+------------+---------+-----------+----------+-------+ Radial        Full                                           +----------+------------+---------+-----------+----------+-------+  Specimen Description BLOOD RIGHT HAND  Final   Special Requests   Final    BOTTLES DRAWN AEROBIC AND ANAEROBIC Blood Culture adequate volume   Culture   Final    NO GROWTH 1 DAY Performed at [facility], [address], [place], [place] [address]    Report Status PENDING  Incomplete  Culture, blood (routine x 2)     Status: None (Preliminary result)   Collection Time: 10/12/20  8:04 AM   Specimen: BLOOD LEFT HAND  Result Value Ref Range Status   Specimen Description BLOOD LEFT HAND  Final   Special Requests   Final    BOTTLES DRAWN AEROBIC AND ANAEROBIC Blood Culture adequate volume   Culture   Final    NO GROWTH 1 DAY Performed at [facility], [address] [place], [place] [address]    Report Status PENDING  Incomplete         Radiology Studies: VAS [place] UPPER EXTREMITY  VENOUS DUPLEX  Result Date: 10/13/2020 UPPER VENOUS STUDY  Patient Name:  JORY TANGUMA  Date of Exam:   10/13/2020 Medical Rec #: [id number]      Accession #:    [id number] Date of Birth: [birth year]     Patient Gender: M Patient Age:   68 years Exam Location:  [facility] Procedure:      VAS [place] UPPER EXTREMITY VENOUS DUPLEX Referring Phys: Oley Lahaie POWELL JR --------------------------------------------------------------------------------  Indications: Prior cephalic clot, worsening swelling and erythema Comparison Study: 10-11-2020 Prior left upper extremity venous study was                   positive for acute superficial thrombosis involving the                   cephalic vein. Performing Technologist: Jean Rosenthal RDMS, RVT  Examination Guidelines: Brandilee Pies complete evaluation includes B-mode imaging, spectral Doppler, color Doppler, and power Doppler as needed of all accessible portions of each vessel. Bilateral testing is considered an integral part of Aaylah Pokorny complete examination. Limited examinations for reoccurring indications may be performed as noted.  Left Findings: +----------+------------+---------+-----------+----------+-------+ LEFT      CompressiblePhasicitySpontaneousPropertiesSummary +----------+------------+---------+-----------+----------+-------+ IJV           Full       Yes       Yes                      +----------+------------+---------+-----------+----------+-------+ Subclavian    Full       Yes       Yes                      +----------+------------+---------+-----------+----------+-------+ Axillary      Full       Yes       Yes                      +----------+------------+---------+-----------+----------+-------+ Brachial      Full                                          +----------+------------+---------+-----------+----------+-------+ Radial        Full                                           +----------+------------+---------+-----------+----------+-------+  Specimen Description BLOOD RIGHT HAND  Final   Special Requests   Final    BOTTLES DRAWN AEROBIC AND ANAEROBIC Blood Culture adequate volume   Culture   Final    NO GROWTH 1 DAY Performed at [facility], [address], [place], [place] [address]    Report Status PENDING  Incomplete  Culture, blood (routine x 2)     Status: None (Preliminary result)   Collection Time: 10/12/20  8:04 AM   Specimen: BLOOD LEFT HAND  Result Value Ref Range Status   Specimen Description BLOOD LEFT HAND  Final   Special Requests   Final    BOTTLES DRAWN AEROBIC AND ANAEROBIC Blood Culture adequate volume   Culture   Final    NO GROWTH 1 DAY Performed at [facility], [address] [place], [place] [address]    Report Status PENDING  Incomplete         Radiology Studies: VAS [place] UPPER EXTREMITY  VENOUS DUPLEX  Result Date: 10/13/2020 UPPER VENOUS STUDY  Patient Name:  JORY TANGUMA  Date of Exam:   10/13/2020 Medical Rec #: [id number]      Accession #:    [id number] Date of Birth: [birth year]     Patient Gender: M Patient Age:   68 years Exam Location:  [facility] Procedure:      VAS [place] UPPER EXTREMITY VENOUS DUPLEX Referring Phys: Oley Lahaie POWELL JR --------------------------------------------------------------------------------  Indications: Prior cephalic clot, worsening swelling and erythema Comparison Study: 10-11-2020 Prior left upper extremity venous study was                   positive for acute superficial thrombosis involving the                   cephalic vein. Performing Technologist: Jean Rosenthal RDMS, RVT  Examination Guidelines: Brandilee Pies complete evaluation includes B-mode imaging, spectral Doppler, color Doppler, and power Doppler as needed of all accessible portions of each vessel. Bilateral testing is considered an integral part of Aaylah Pokorny complete examination. Limited examinations for reoccurring indications may be performed as noted.  Left Findings: +----------+------------+---------+-----------+----------+-------+ LEFT      CompressiblePhasicitySpontaneousPropertiesSummary +----------+------------+---------+-----------+----------+-------+ IJV           Full       Yes       Yes                      +----------+------------+---------+-----------+----------+-------+ Subclavian    Full       Yes       Yes                      +----------+------------+---------+-----------+----------+-------+ Axillary      Full       Yes       Yes                      +----------+------------+---------+-----------+----------+-------+ Brachial      Full                                          +----------+------------+---------+-----------+----------+-------+ Radial        Full                                           +----------+------------+---------+-----------+----------+-------+  Specimen Description BLOOD RIGHT HAND  Final   Special Requests   Final    BOTTLES DRAWN AEROBIC AND ANAEROBIC Blood Culture adequate volume   Culture   Final    NO GROWTH 1 DAY Performed at [facility], [address], [place], [place] [address]    Report Status PENDING  Incomplete  Culture, blood (routine x 2)     Status: None (Preliminary result)   Collection Time: 10/12/20  8:04 AM   Specimen: BLOOD LEFT HAND  Result Value Ref Range Status   Specimen Description BLOOD LEFT HAND  Final   Special Requests   Final    BOTTLES DRAWN AEROBIC AND ANAEROBIC Blood Culture adequate volume   Culture   Final    NO GROWTH 1 DAY Performed at [facility], [address] [place], [place] [address]    Report Status PENDING  Incomplete         Radiology Studies: VAS [place] UPPER EXTREMITY  VENOUS DUPLEX  Result Date: 10/13/2020 UPPER VENOUS STUDY  Patient Name:  JORY TANGUMA  Date of Exam:   10/13/2020 Medical Rec #: [id number]      Accession #:    [id number] Date of Birth: [birth year]     Patient Gender: M Patient Age:   68 years Exam Location:  [facility] Procedure:      VAS [place] UPPER EXTREMITY VENOUS DUPLEX Referring Phys: Oley Lahaie POWELL JR --------------------------------------------------------------------------------  Indications: Prior cephalic clot, worsening swelling and erythema Comparison Study: 10-11-2020 Prior left upper extremity venous study was                   positive for acute superficial thrombosis involving the                   cephalic vein. Performing Technologist: Jean Rosenthal RDMS, RVT  Examination Guidelines: Brandilee Pies complete evaluation includes B-mode imaging, spectral Doppler, color Doppler, and power Doppler as needed of all accessible portions of each vessel. Bilateral testing is considered an integral part of Aaylah Pokorny complete examination. Limited examinations for reoccurring indications may be performed as noted.  Left Findings: +----------+------------+---------+-----------+----------+-------+ LEFT      CompressiblePhasicitySpontaneousPropertiesSummary +----------+------------+---------+-----------+----------+-------+ IJV           Full       Yes       Yes                      +----------+------------+---------+-----------+----------+-------+ Subclavian    Full       Yes       Yes                      +----------+------------+---------+-----------+----------+-------+ Axillary      Full       Yes       Yes                      +----------+------------+---------+-----------+----------+-------+ Brachial      Full                                          +----------+------------+---------+-----------+----------+-------+ Radial        Full                                           +----------+------------+---------+-----------+----------+-------+  PROGRESS NOTE    Karl Peters  QPR:[id number] DOB: [birth year] DOA: 10/07/2020 PCP: Elizabeth Palau, FNP   Chief Complaint  Patient presents with   Weakness   Nausea   Brief Narrative:  This 80 years old male with PMH significant for type 2 diabetes, hypertension, hyperlipidemia, prior NSVT and symptomatic PVCs treated with beta-blockers , presented in the ED with generalized weakness.  Patient was started on Jardiance 2 weeks ago which made him feel so bad,  he stopped taking it, then he presented with nausea, vomiting and generalized weakness.  He reports he was working in his garden when he became very tired and had to sit down and developed short of breath but denies any chest pain.  He is found to have slightly elevated serum creatinine from his baseline, EKG shows some abnormalities multiple PVCs.  Cardiology was consulted, recommended to repeat echocardiogram and would not pursue another ischemic evaluation unless there is any change in his systolic function.  Continue Coreg twice.  He's now s/p stress test, area of reduced perfusion concerning for infarct.  Planning for cath 8/25 AM.  Assessment & Plan:   Principal Problem:   AKI (acute kidney injury) (HCC) Active Problems:   DM2 (diabetes mellitus, type 2) (HCC)   Essential hypertension   NSVT (nonsustained ventricular tachycardia) (HCC)   Syncope   Hypotension   Chest pressure   Arrhythmia   SOB (shortness of breath)  Septic Thrombophlebitis  Sepsis  Strep Pyogenes Bacteremia  Superficial Vein Thrombosis of L Cephalic Vein LUE [place] notable for superficial venous thrombosis - continue lovenox (change to ease IV needs) for now (per discussion with [place] tech, clot likely >5 cm).  Given worsening swelling below, will plan for treatment - not great guidance for upper extremity SVT.  Will refer to follow up with vascular outpatient (discussed informally).   Last true fever 8/26 (but elevated temp, 99.9 on 8/27) Strep pyogenes  bacteremia Repeat blood cultures NG Worse LUE swelling today, follow repeat US and CT scan  ID c/s, appreciate recs -> penicillin for now, amoxicllin x 14 days (500 mg TID) at the time of discharge.  Need 24 hrs afebrile prior to discharge.  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%, mildly decreased function - no RWMA Myoview with large area of reduced perfusion at rest and stress in inferior and inferolateral wall from apex to base, concerning for infarct Patient has multiple PVCs on EKG S/p LHC without significant obstructive coronary disease in the major arteries -> recommending risk factor modification to prevent progression of mild coronary atherosclerosis -> EP evaluation  Transition to toprol  Imdur added, hydral 10 mg TID EP eval, appreciate recommendations - recommending resuming beta blocker, hydration - no further EP w/u planned Follow TSH wnl Cardiology now signed off -> ASA, hydral, imdur, toprol, crestor - holding on diuretics at this time - recommending ziopatch x2 weeks and bmet in 1 week - outpatient f/u with Dr. Johney Frame in 4 weeks  Acute kidney injury: Presented with serum creatinine of 1.7 increased from his baseline 1.0 Fluctuating  UA bland Renal US normal  Continue IVF Holding ARB  Hypertension: Continue Coreg, Norvasc . resume valsartan once renal function improves.   DM2: Hold oral diabetic medications Regular insulin sliding scale   Hyperlipidemia :  continue Crestor (increased to 40)   RUQ pain LFTs normal, ultrasound unremarkable.  DVT prophylaxis: heparin  Code Status: full  Family Communication: none at  Specimen Description BLOOD RIGHT HAND  Final   Special Requests   Final    BOTTLES DRAWN AEROBIC AND ANAEROBIC Blood Culture adequate volume   Culture   Final    NO GROWTH 1 DAY Performed at [facility], [address], [place], [place] [address]    Report Status PENDING  Incomplete  Culture, blood (routine x 2)     Status: None (Preliminary result)   Collection Time: 10/12/20  8:04 AM   Specimen: BLOOD LEFT HAND  Result Value Ref Range Status   Specimen Description BLOOD LEFT HAND  Final   Special Requests   Final    BOTTLES DRAWN AEROBIC AND ANAEROBIC Blood Culture adequate volume   Culture   Final    NO GROWTH 1 DAY Performed at [facility], [address] [place], [place] [address]    Report Status PENDING  Incomplete         Radiology Studies: VAS [place] UPPER EXTREMITY  VENOUS DUPLEX  Result Date: 10/13/2020 UPPER VENOUS STUDY  Patient Name:  JORY TANGUMA  Date of Exam:   10/13/2020 Medical Rec #: [id number]      Accession #:    [id number] Date of Birth: [birth year]     Patient Gender: M Patient Age:   68 years Exam Location:  [facility] Procedure:      VAS [place] UPPER EXTREMITY VENOUS DUPLEX Referring Phys: Oley Lahaie POWELL JR --------------------------------------------------------------------------------  Indications: Prior cephalic clot, worsening swelling and erythema Comparison Study: 10-11-2020 Prior left upper extremity venous study was                   positive for acute superficial thrombosis involving the                   cephalic vein. Performing Technologist: Jean Rosenthal RDMS, RVT  Examination Guidelines: Brandilee Pies complete evaluation includes B-mode imaging, spectral Doppler, color Doppler, and power Doppler as needed of all accessible portions of each vessel. Bilateral testing is considered an integral part of Aaylah Pokorny complete examination. Limited examinations for reoccurring indications may be performed as noted.  Left Findings: +----------+------------+---------+-----------+----------+-------+ LEFT      CompressiblePhasicitySpontaneousPropertiesSummary +----------+------------+---------+-----------+----------+-------+ IJV           Full       Yes       Yes                      +----------+------------+---------+-----------+----------+-------+ Subclavian    Full       Yes       Yes                      +----------+------------+---------+-----------+----------+-------+ Axillary      Full       Yes       Yes                      +----------+------------+---------+-----------+----------+-------+ Brachial      Full                                          +----------+------------+---------+-----------+----------+-------+ Radial        Full                                           +----------+------------+---------+-----------+----------+-------+

## 2020-10-13 NOTE — Progress Notes (Signed)
Upper extremity venous LT study completed.  Preliminary results relayed to Powell, MD.   See CV Proc for preliminary results report.   Brandonlee Navis, RDMS, RVT  

## 2020-10-13 NOTE — Progress Notes (Signed)
ANTICOAGULATION CONSULT NOTE  Pharmacy Consult for heparin Indication: acute SVT of  LCV  Allergies  Allergen Reactions   Jardiance [Empagliflozin] Nausea Only    Patient Measurements: Height: 5\' 8"  (172.7 cm) Weight: 118.3 kg (260 lb 12.8 oz) IBW/kg (Calculated) : 68.4 Heparin Dosing Weight: 95kg  Vital Signs: Temp: 99.8 F (37.7 C) (08/28 0428) Temp Source: Oral (08/28 0428) BP: 135/76 (08/28 0428) Pulse Rate: 76 (08/28 0428)  Labs: Recent Labs    10/10/20 0928 10/11/20 0201 10/11/20 1821 10/12/20 0330 10/12/20 1149 10/13/20 0351  HGB  --  13.3  --   --   --  11.8*  HCT  --  41.1  --   --   --  35.4*  PLT  --  196  --   --   --  163  HEPARINUNFRC  --   --    < > 0.48 0.39 0.24*  CREATININE 1.49* 1.43*  --   --   --   --    < > = values in this interval not displayed.     Estimated Creatinine Clearance: 53.2 mL/min (A) (by C-G formula based on SCr of 1.43 mg/dL (H)).   Assessment: 80 y.o. male with SVT of L cephalic vein for heparin  Goal of Therapy:  Heparin level 0.3-0.7 units/ml Monitor platelets by anticoagulation protocol: Yes   Plan:  Increase Heparin 1800 units/hr  70, PharmD, BCPS

## 2020-10-13 NOTE — Progress Notes (Signed)
ANTICOAGULATION CONSULT NOTE  Pharmacy Consult for enoxaparin Indication: empiric treatment of septic thrombophlebitis  Allergies  Allergen Reactions   Jardiance [Empagliflozin] Nausea Only    Patient Measurements: Height: 5\' 8"  (172.7 cm) Weight: 118.3 kg (260 lb 12.8 oz) IBW/kg (Calculated) : 68.4 Heparin Dosing Weight: 95kg  Vital Signs: Temp: 99.8 F (37.7 C) (08/28 0428) Temp Source: Oral (08/28 0428) BP: 115/73 (08/28 0938) Pulse Rate: 84 (08/28 0938)  Labs: Recent Labs    10/11/20 0201 10/11/20 1821 10/12/20 0330 10/12/20 1149 10/13/20 0351  HGB 13.3  --   --   --  11.8*  HCT 41.1  --   --   --  35.4*  PLT 196  --   --   --  163  HEPARINUNFRC  --    < > 0.48 0.39 0.24*  CREATININE 1.43*  --   --   --  1.56*   < > = values in this interval not displayed.     Estimated Creatinine Clearance: 48.8 mL/min (A) (by C-G formula based on SCr of 1.56 mg/dL (H)).   Assessment: 80 y.o. male with SVT of L cephalic vein and was started on heparin 10/11/20 and was transitioned to enoxaparin on 8/28 for empiric treatment of septic thrombophlebitis.   CBC and renal function are stable. No signs of bleeding noted. Given BMI >30, will start enoxaparin 1 mg/kg q12h. If renal function worsens (CrCl <30), recommend decreasing to 1 mg/kg q24h.   Goal of Therapy:  Monitor platelets by anticoagulation protocol: Yes   Plan:  DC heparin infusion Start enoxaparin 120 mg q12h Monitor renal function, CBC, s/sx of bleeding Follow up long term AC plan.   9/28, Pharm.D. PGY-1 Pharmacy Resident Phone:[phone] 10/13/2020 12:11 PM

## 2020-10-14 LAB — GLUCOSE, CAPILLARY
Glucose-Capillary: 160 mg/dL — ABNORMAL HIGH (ref 70–99)
Glucose-Capillary: 155 mg/dL — ABNORMAL HIGH (ref 70–99)
Glucose-Capillary: 184 mg/dL — ABNORMAL HIGH (ref 70–99)

## 2020-10-14 LAB — CBC WITH DIFFERENTIAL/PLATELET
Abs Immature Granulocytes: 0.06 10*3/uL (ref 0.00–0.07)
Basophils Absolute: 0.1 10*3/uL (ref 0.0–0.1)
Basophils Relative: 1 %
Eosinophils Absolute: 0.1 10*3/uL (ref 0.0–0.5)
Eosinophils Relative: 1 %
HCT: 38.5 % — ABNORMAL LOW (ref 39.0–52.0)
Hemoglobin: 12.9 g/dL — ABNORMAL LOW (ref 13.0–17.0)
Immature Granulocytes: 1 %
Lymphocytes Relative: 19 %
Lymphs Abs: 1.1 10*3/uL (ref 0.7–4.0)
MCH: 29.6 pg (ref 26.0–34.0)
MCHC: 33.5 g/dL (ref 30.0–36.0)
MCV: 88.3 fL (ref 80.0–100.0)
Monocytes Absolute: 1 10*3/uL (ref 0.1–1.0)
Monocytes Relative: 16 %
Neutro Abs: 3.7 10*3/uL (ref 1.7–7.7)
Neutrophils Relative %: 62 %
Platelets: 151 10*3/uL (ref 150–400)
RBC: 4.36 MIL/uL (ref 4.22–5.81)
RDW: 13.9 % (ref 11.5–15.5)
WBC: 6 10*3/uL (ref 4.0–10.5)
nRBC: 0 % (ref 0.0–0.2)

## 2020-10-14 LAB — COMPREHENSIVE METABOLIC PANEL
ALT: 22 U/L (ref 0–44)
AST: 24 U/L (ref 15–41)
Albumin: 2.6 g/dL — ABNORMAL LOW (ref 3.5–5.0)
Alkaline Phosphatase: 66 U/L (ref 38–126)
Anion gap: 7 (ref 5–15)
BUN: 21 mg/dL (ref 8–23)
CO2: 21 mmol/L — ABNORMAL LOW (ref 22–32)
Calcium: 8.6 mg/dL — ABNORMAL LOW (ref 8.9–10.3)
Chloride: 105 mmol/L (ref 98–111)
Creatinine, Ser: 1.43 mg/dL — ABNORMAL HIGH (ref 0.61–1.24)
GFR, Estimated: 50 mL/min — ABNORMAL LOW (ref >60)
Glucose, Bld: 198 mg/dL — ABNORMAL HIGH (ref 70–99)
Potassium: 4.2 mmol/L (ref 3.5–5.1)
Sodium: 133 mmol/L — ABNORMAL LOW (ref 135–145)
Total Bilirubin: 0.5 mg/dL (ref 0.3–1.2)
Total Protein: 6 g/dL — ABNORMAL LOW (ref 6.5–8.1)

## 2020-10-14 MED ORDER — ASPIRIN 81 MG PO CHEW: 81.0000 mg | CHEWABLE_TABLET | Freq: Every day | ORAL | 1 refills | Status: AC

## 2020-10-14 MED ORDER — APIXABAN 5 MG PO TABS: 5.0000 mg | ORAL_TABLET | Freq: Two times a day (BID) | ORAL | 0 refills | Status: DC

## 2020-10-14 MED ORDER — ROSUVASTATIN CALCIUM 20 MG PO TABS: 40.0000 mg | ORAL_TABLET | Freq: Every morning | ORAL | 0 refills | Status: DC

## 2020-10-14 MED ORDER — AMOXICILLIN 500 MG PO CAPS: 500.0000 mg | ORAL_CAPSULE | Freq: Three times a day (TID) | ORAL | 0 refills | Status: AC

## 2020-10-14 MED ORDER — METOPROLOL SUCCINATE ER 50 MG PO TB24: 50.0000 mg | ORAL_TABLET | Freq: Every day | ORAL | 1 refills | Status: DC

## 2020-10-14 MED ORDER — APIXABAN 5 MG PO TABS: 5.0000 mg | ORAL_TABLET | Freq: Two times a day (BID) | ORAL | Status: DC

## 2020-10-14 MED ORDER — HYDRALAZINE HCL 10 MG PO TABS: 10.0000 mg | ORAL_TABLET | Freq: Three times a day (TID) | ORAL | 1 refills | Status: DC

## 2020-10-14 MED ORDER — ISOSORBIDE MONONITRATE ER 30 MG PO TB24: 15.0000 mg | ORAL_TABLET | Freq: Every day | ORAL | 1 refills | Status: DC

## 2020-10-14 NOTE — Progress Notes (Signed)
Orthopedic Tech Progress Note Patient Details:  Karl Peters 1941-01-14 [id number]  Ortho Devices Type of Ortho Device: Velcro wrist splint Ortho Device/Splint Location: LUE Ortho Device/Splint Interventions: Ordered, Adjustment, Application   Post Interventions Patient Tolerated: Well Instructions Provided: Care of device  Karl Peters 10/14/2020, 4:12 PM

## 2020-10-14 NOTE — Evaluation (Signed)
Physical Therapy Evaluation and Discharge Patient Details Name: Karl Peters MRN: [id number] DOB: [birth year] Today's Date: 10/14/2020   History of Present Illness  Pt is a 80 y.o. M who presented 10/09/2020 with generalized weaknses, nausea and vomiting afer starting Jardiance 2 weeks prior.  Pt s/p LHC which was without significant obstructive coronary disease in major arteries; cardiology recommend risk factor modification. Pt also found to have AKI and septic thrombophlebitis with superficial vein thrombosis of L cephalic vein. Significant PMH: diabetes mellitus, HTN, obesity, ventricular tachycardia and sypmptomatic PVCs.  Clinical Impression  Patient evaluated by Physical Therapy with no further acute PT needs identified. Pt reports feeling mildly weaker than baseline, but overall is not requiring physical assist for mobility. Pt ambulating hallway distances with no assistive device and negotiated 2 steps with good activity tolerance. Education provided regarding activity recommendations. All education has been completed and the patient has no further questions. No follow-up Physical Therapy or equipment needs. PT is signing off. Thank you for this referral.     Follow Up Recommendations No PT follow up    Equipment Recommendations  None recommended by PT    Recommendations for Other Services       Precautions / Restrictions Precautions Precautions: None Restrictions Weight Bearing Restrictions: No      Mobility  Bed Mobility Overal bed mobility: Modified Independent                  Transfers Overall transfer level: Independent Equipment used: None                Ambulation/Gait Ambulation/Gait assistance: Modified independent (Device/Increase time) Gait Distance (Feet): 350 Feet Assistive device: None Gait Pattern/deviations: Step-through pattern;Decreased stride length Gait velocity: mildly decreased   General Gait Details: Slow and steady pace, no  gross instability noted or dyspnea on exertion  Stairs Stairs: Yes Stairs assistance: Modified independent (Device/Increase time) Stair Management: One rail Left Number of Stairs: 2    Wheelchair Mobility    Modified Rankin (Stroke Patients Only)       Balance Overall balance assessment: Mild deficits observed, not formally tested                                           Pertinent Vitals/Pain Pain Assessment: Faces Faces Pain Scale: No hurt    Home Living Family/patient expects to be discharged to:: Private residence Living Arrangements: Spouse/significant other Available Help at Discharge: Family Type of Home: House Home Access: Stairs to enter   Secretary/administrator of Steps: 2 Home Layout: One level Home Equipment: None      Prior Function Level of Independence: Independent         Comments: Enjoys yard work, Geophysicist/field seismologist        Extremity/Trunk Assessment   Upper Extremity Assessment Upper Extremity Assessment: Overall WFL for tasks assessed    Lower Extremity Assessment Lower Extremity Assessment: Overall WFL for tasks assessed    Cervical / Trunk Assessment Cervical / Trunk Assessment: Kyphotic (mildly kyphotic)  Communication   Communication: No difficulties  Cognition Arousal/Alertness: Awake/alert Behavior During Therapy: WFL for tasks assessed/performed Overall Cognitive Status: Within Functional Limits for tasks assessed  General Comments General comments (skin integrity, edema, etc.): VSS on RA    Exercises     Assessment/Plan    PT Assessment Patent does not need any further PT services  PT Problem List         PT Treatment Interventions      PT Goals (Current goals can be found in the Care Plan section)  Acute Rehab PT Goals Patient Stated Goal: to go hunting in the winter PT Goal Formulation: All assessment and education  complete, DC therapy    Frequency     Barriers to discharge        Co-evaluation               AM-PAC PT "6 Clicks" Mobility  Outcome Measure Help needed turning from your back to your side while in a flat bed without using bedrails?: None Help needed moving from lying on your back to sitting on the side of a flat bed without using bedrails?: None Help needed moving to and from a bed to a chair (including a wheelchair)?: None Help needed standing up from a chair using your arms (e.g., wheelchair or bedside chair)?: None Help needed to walk in hospital room?: None Help needed climbing 3-5 steps with a railing? : None 6 Click Score: 24    End of Session   Activity Tolerance: Patient tolerated treatment well Patient left: in chair;with call bell/phone within reach Nurse Communication: Mobility status PT Visit Diagnosis: Difficulty in walking, not elsewhere classified (R26.2)    Time: 8469-6295 PT Time Calculation (min) (ACUTE ONLY): 27 min   Charges:   PT Evaluation $PT Eval Low Complexity: 1 Low PT Treatments $Therapeutic Activity: 8-22 mins        Lillia Pauls, PT, DPT Acute Rehabilitation Services Pager [phone] Office [phone]   Norval Morton 10/14/2020, 2:30 PM

## 2020-10-14 NOTE — TOC Benefit Eligibility Note (Signed)
Transition of Care [facility]) Benefit Eligibility Note    Patient Details  Name: BUCK MCAFFEE MRN: [id number] Date of Birth: [birth year]   Medication/Dose: Everlene Balls  5 MG BID  Covered?: Yes  Tier: 3 Drug  Prescription Coverage Preferred Pharmacy: [facility]  Spoke with Person/Company/Phone Number:: IZA   @ [employer] # [phone]  Co-Pay: $ 47.00  Prior Approval: No  Deductible:  (NO DEDUCTIBLE WITH PLAN / OUT-OF-POCKET:UNMET)       Mardene Sayer Phone Number: 10/14/2020, 10:28 AM

## 2020-10-14 NOTE — Progress Notes (Signed)
ANTICOAGULATION CONSULT NOTE  Pharmacy Consult for enoxaparin to apixaban Indication: empiric treatment of septic thrombophlebitis  Allergies  Allergen Reactions   Jardiance [Empagliflozin] Nausea Only    Patient Measurements: Height: 5\' 8"  (172.7 cm) Weight: 118.3 kg (260 lb 12.8 oz) IBW/kg (Calculated) : 68.4 Heparin Dosing Weight: 95kg  Vital Signs: Temp: 99.6 F (37.6 C) (08/29 0557) Temp Source: Oral (08/29 0557) BP: 165/55 (08/29 0557) Pulse Rate: 72 (08/29 0557)  Labs: Recent Labs    10/12/20 0330 10/12/20 1149 10/13/20 0351 10/14/20 0902  HGB  --   --  11.8* 12.9*  HCT  --   --  35.4* 38.5*  PLT  --   --  163 151  HEPARINUNFRC 0.48 0.39 0.24*  --   CREATININE  --   --  1.56* 1.43*     Estimated Creatinine Clearance: 53.2 mL/min (A) (by C-G formula based on SCr of 1.43 mg/dL (H)).   Assessment: 80 y.o. male with SVT of L cephalic vein and was started on heparin 10/11/20 and was transitioned to enoxaparin on 8/28 for empiric treatment of septic thrombophlebitis.   Cr stable, LFTs wnl, pharmacy to transition to apixaban - no load given superficial thrombus. Copay $47/mo.  Goal of Therapy:  Monitor platelets by anticoagulation protocol: Yes   Plan:  Stop enoxaparin Apixaban 5mg  BID Pharmacy will sign off, reconsult as needed  9/28, PharmD, BCPS, [facility] Clinical Pharmacist [phone] Please check AMION for all [facility] Pharmacy numbers 10/14/2020

## 2020-10-14 NOTE — Discharge Summary (Addendum)
measurements: 11.1 x 4.6 x 4.7 cm = volume: 125 mL. Echogenicity within normal limits. No mass or hydronephrosis visualized. Bladder: Appears normal for degree of bladder distention. Other: None. IMPRESSION: Normal ultrasound examination of the kidneys.  No hydronephrosis. Electronically Signed   By: Karl Peters M.D.   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 The study is high risk.   No ST deviation was noted.   LV perfusion is abnormal.   Defect 1: There is Karl Peters large defect with moderate reduction in uptake present in the apical to basal inferior and inferolateral location(s) that is fixed. There is abnormal wall motion in the defect area. Consistent with infarction.   The left ventricular ejection fraction is severely decreased (<30%). Left ventricular function is abnormal. Global function is severely reduced. End diastolic cavity size is moderately enlarged. End systolic cavity size is moderately enlarged. Difficulty in assessment of EF/wall motion due to frequent ectopy. Large area of reduced perfusion at both rest and stress in the inferior and inferolateral wall, from apex to base. Concerning for infarct. Findings communicated with Karl Peters.   DG CHEST PORT 1 VIEW  Result Date: 10/11/2020 CLINICAL DATA:  Fever EXAM: PORTABLE CHEST 1 VIEW COMPARISON:  Portable exam 0832 hours compared to 10/07/2020 FINDINGS: Normal heart size, mediastinal contours, and pulmonary vascularity. Mild  atelectasis at RIGHT base. Lungs otherwise clear. No infiltrate, pleural effusion, or pneumothorax. Prior cervical spine fusion. IMPRESSION: Mild RIGHT basilar atelectasis. Electronically Signed   By: Karl Peters M.D.   On: 10/11/2020 11:03   ECHOCARDIOGRAM COMPLETE  Result Date: 10/08/2020    ECHOCARDIOGRAM REPORT   Patient Name:   Karl Peters Date of Exam: 10/08/2020 Medical Rec #:  [id number]     Height:       68.0 in Accession #:    [id number]    Weight:       287.0 lb Date of Birth:  08-06-1940    BSA:          2.383 m Patient Age:    79 years      BP:           141/72 mmHg Patient Gender: M             HR:           81 bpm. Exam Location:  Inpatient Procedure: 2D Echo, Cardiac Doppler, Color Doppler and Intracardiac            Opacification Agent Indications:    Abnormal ECG R94.31  History:        Patient has prior history of Echocardiogram examinations, most                 recent 03/19/2016. Arrythmias:RBBB, PVC, Bradycardia and                 Ventricular tachycardia. First-degree AV block. Trifascicular                 block; Risk Factors:Hypertension, Diabetes and Dyslipidemia.  Sonographer:    Karl Peters Peters Referring Phys: [phone] Karl Peters  Sonographer Comments: Technically difficult study due to poor echo windows. IMPRESSIONS  1. Left ventricular ejection fraction, by estimation, is 40 to 45% with significant beat to beat variability. The left ventricle has mildly decreased function. The left ventricle has no regional wall motion abnormalities. Left ventricular diastolic parameters are indeterminate.  Ulnar         Full                                          +----------+------------+---------+-----------+----------+-------+ Cephalic      None       No        No                Acute  +----------+------------+---------+-----------+----------+-------+ Basilic       Full                                          +----------+------------+---------+-----------+----------+-------+  Summary:  Left: No evidence of deep vein thrombosis in the upper extremity. Findings consistent with acute superficial vein thrombosis involving the left cephalic vein. Findings appear essentially unchanged as compared to previous examination.   *See table(s) above for measurements and observations.    Preliminary    VAS [place] UPPER EXTREMITY VENOUS DUPLEX  Result Date: 10/11/2020 UPPER VENOUS STUDY  Patient Name:  Karl Peters  Date of Exam:   10/11/2020 Medical Rec #: [id number]      Accession #:    [id number] Date of Birth: [birth year]     Patient Gender: M Patient Age:   31 years Exam Location:  [facility] Procedure:      VAS [place] UPPER EXTREMITY VENOUS DUPLEX Referring Phys: Karl Peters --------------------------------------------------------------------------------  Indications: pain, edema, and erythema at recent IV site. Comparison Study: No prior study Performing Technologist: Karl Peters  Examination Guidelines: Karl Peters complete evaluation includes B-mode imaging, spectral Doppler, color Doppler, and power Doppler as needed of all accessible portions of each vessel. Bilateral testing is considered an integral part of Karl Peters complete examination. Limited examinations for reoccurring indications may be performed as noted.  Right Findings: +----------+------------+---------+-----------+----------+-------+ RIGHT     CompressiblePhasicitySpontaneousPropertiesSummary +----------+------------+---------+-----------+----------+-------+ Subclavian               Yes       Yes                      +----------+------------+---------+-----------+----------+-------+  Left Findings: +----------+------------+---------+-----------+----------+-------+ LEFT      CompressiblePhasicitySpontaneousPropertiesSummary +----------+------------+---------+-----------+----------+-------+ IJV           Full       Yes       Yes                      +----------+------------+---------+-----------+----------+-------+ Subclavian    Full       Yes       Yes                      +----------+------------+---------+-----------+----------+-------+ Axillary      Full       Yes       Yes                       +----------+------------+---------+-----------+----------+-------+ Brachial      Full       Yes       Yes                      +----------+------------+---------+-----------+----------+-------+ Radial        Full                                          +----------+------------+---------+-----------+----------+-------+  measurements: 11.1 x 4.6 x 4.7 cm = volume: 125 mL. Echogenicity within normal limits. No mass or hydronephrosis visualized. Bladder: Appears normal for degree of bladder distention. Other: None. IMPRESSION: Normal ultrasound examination of the kidneys.  No hydronephrosis. Electronically Signed   By: Karl Peters M.D.   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 The study is high risk.   No ST deviation was noted.   LV perfusion is abnormal.   Defect 1: There is Karl Peters large defect with moderate reduction in uptake present in the apical to basal inferior and inferolateral location(s) that is fixed. There is abnormal wall motion in the defect area. Consistent with infarction.   The left ventricular ejection fraction is severely decreased (<30%). Left ventricular function is abnormal. Global function is severely reduced. End diastolic cavity size is moderately enlarged. End systolic cavity size is moderately enlarged. Difficulty in assessment of EF/wall motion due to frequent ectopy. Large area of reduced perfusion at both rest and stress in the inferior and inferolateral wall, from apex to base. Concerning for infarct. Findings communicated with Karl Peters.   DG CHEST PORT 1 VIEW  Result Date: 10/11/2020 CLINICAL DATA:  Fever EXAM: PORTABLE CHEST 1 VIEW COMPARISON:  Portable exam 0832 hours compared to 10/07/2020 FINDINGS: Normal heart size, mediastinal contours, and pulmonary vascularity. Mild  atelectasis at RIGHT base. Lungs otherwise clear. No infiltrate, pleural effusion, or pneumothorax. Prior cervical spine fusion. IMPRESSION: Mild RIGHT basilar atelectasis. Electronically Signed   By: Karl Peters M.D.   On: 10/11/2020 11:03   ECHOCARDIOGRAM COMPLETE  Result Date: 10/08/2020    ECHOCARDIOGRAM REPORT   Patient Name:   Karl Peters Date of Exam: 10/08/2020 Medical Rec #:  [id number]     Height:       68.0 in Accession #:    [id number]    Weight:       287.0 lb Date of Birth:  08-06-1940    BSA:          2.383 m Patient Age:    79 years      BP:           141/72 mmHg Patient Gender: M             HR:           81 bpm. Exam Location:  Inpatient Procedure: 2D Echo, Cardiac Doppler, Color Doppler and Intracardiac            Opacification Agent Indications:    Abnormal ECG R94.31  History:        Patient has prior history of Echocardiogram examinations, most                 recent 03/19/2016. Arrythmias:RBBB, PVC, Bradycardia and                 Ventricular tachycardia. First-degree AV block. Trifascicular                 block; Risk Factors:Hypertension, Diabetes and Dyslipidemia.  Sonographer:    Karl Peters Peters Referring Phys: [phone] Karl Peters  Sonographer Comments: Technically difficult study due to poor echo windows. IMPRESSIONS  1. Left ventricular ejection fraction, by estimation, is 40 to 45% with significant beat to beat variability. The left ventricle has mildly decreased function. The left ventricle has no regional wall motion abnormalities. Left ventricular diastolic parameters are indeterminate.  Vmax:   105.63 cm/s LVOT Vmean:  68.733 cm/s LVOT VTI:    0.192 m  AORTA Ao Root diam: 3.30 cm Ao Asc diam:  3.40 cm MITRAL VALVE               TRICUSPID VALVE MV Area (PHT): 3.88 cm    TR Peak grad:   26.8 mmHg MV Decel Time: 195 msec    TR Vmax:        259.00 cm/s MV E velocity: 67.07 cm/s MV Jayne Peckenpaugh velocity: 77.43 cm/s  SHUNTS MV E/Clearnce Leja ratio:  0.87        Systemic VTI:  0.19 m                            Systemic Diam: 1.80 cm Weston Brass MD Electronically signed by Weston Brass MD Signature Date/Time: 10/08/2020/1:27:29 PM    Final    VAS [place] UPPER EXTREMITY VENOUS DUPLEX  Result Date: 10/13/2020 UPPER VENOUS STUDY  Patient Name:  Karl Peters  Date of Exam:   10/13/2020 Medical Rec #: [id number]      Accession #:    [id number] Date of Birth: 1940-09-01     Patient Gender: M Patient Age:   67 years Exam Location:  [facility] Procedure:      VAS [place] UPPER EXTREMITY VENOUS DUPLEX Referring Phys: Terrilynn Postell POWELL Peters --------------------------------------------------------------------------------  Indications: Prior cephalic clot, worsening swelling and erythema Comparison Study: 10-11-2020 Prior left upper extremity venous study was                   positive for acute superficial thrombosis involving  the                   cephalic vein. Performing Technologist: Jean Rosenthal RDMS, RVT  Examination Guidelines: Everlie Eble complete evaluation includes B-mode imaging, spectral Doppler, color Doppler, and power Doppler as needed of all accessible portions of each vessel. Bilateral testing is considered an integral part of Fionn Stracke complete examination. Limited examinations for reoccurring indications may be performed as noted.  Left Findings: +----------+------------+---------+-----------+----------+-------+ LEFT      CompressiblePhasicitySpontaneousPropertiesSummary +----------+------------+---------+-----------+----------+-------+ IJV           Full       Yes       Yes                      +----------+------------+---------+-----------+----------+-------+ Subclavian    Full       Yes       Yes                      +----------+------------+---------+-----------+----------+-------+ Axillary      Full       Yes       Yes                      +----------+------------+---------+-----------+----------+-------+ Brachial      Full                                          +----------+------------+---------+-----------+----------+-------+ Radial        Full                                          +----------+------------+---------+-----------+----------+-------+  Vmax:   105.63 cm/s LVOT Vmean:  68.733 cm/s LVOT VTI:    0.192 m  AORTA Ao Root diam: 3.30 cm Ao Asc diam:  3.40 cm MITRAL VALVE               TRICUSPID VALVE MV Area (PHT): 3.88 cm    TR Peak grad:   26.8 mmHg MV Decel Time: 195 msec    TR Vmax:        259.00 cm/s MV E velocity: 67.07 cm/s MV Jayne Peckenpaugh velocity: 77.43 cm/s  SHUNTS MV E/Clearnce Leja ratio:  0.87        Systemic VTI:  0.19 m                            Systemic Diam: 1.80 cm Weston Brass MD Electronically signed by Weston Brass MD Signature Date/Time: 10/08/2020/1:27:29 PM    Final    VAS [place] UPPER EXTREMITY VENOUS DUPLEX  Result Date: 10/13/2020 UPPER VENOUS STUDY  Patient Name:  Karl Peters  Date of Exam:   10/13/2020 Medical Rec #: [id number]      Accession #:    [id number] Date of Birth: 1940-09-01     Patient Gender: M Patient Age:   67 years Exam Location:  [facility] Procedure:      VAS [place] UPPER EXTREMITY VENOUS DUPLEX Referring Phys: Terrilynn Postell POWELL Peters --------------------------------------------------------------------------------  Indications: Prior cephalic clot, worsening swelling and erythema Comparison Study: 10-11-2020 Prior left upper extremity venous study was                   positive for acute superficial thrombosis involving  the                   cephalic vein. Performing Technologist: Jean Rosenthal RDMS, RVT  Examination Guidelines: Everlie Eble complete evaluation includes B-mode imaging, spectral Doppler, color Doppler, and power Doppler as needed of all accessible portions of each vessel. Bilateral testing is considered an integral part of Fionn Stracke complete examination. Limited examinations for reoccurring indications may be performed as noted.  Left Findings: +----------+------------+---------+-----------+----------+-------+ LEFT      CompressiblePhasicitySpontaneousPropertiesSummary +----------+------------+---------+-----------+----------+-------+ IJV           Full       Yes       Yes                      +----------+------------+---------+-----------+----------+-------+ Subclavian    Full       Yes       Yes                      +----------+------------+---------+-----------+----------+-------+ Axillary      Full       Yes       Yes                      +----------+------------+---------+-----------+----------+-------+ Brachial      Full                                          +----------+------------+---------+-----------+----------+-------+ Radial        Full                                          +----------+------------+---------+-----------+----------+-------+  Physician Discharge Summary  Karl Peters OIB:[id number] DOB: May 30, 1940 DOA: 10/07/2020  PCP: Elizabeth Palau, FNP  Admit date: 10/07/2020 Discharge date: 10/14/2020  Time spent: 40 minutes  Recommendations for Outpatient Follow-up:  Follow outpatient CBC/CMP Follow with cardiology outpatient - follow with EP - follow ziopatch Follow with cardiology outpatient  Septic thrombophelbitis - discharging with eliquis and amoxicillin -> follow up with vascular to help decide on duration of anticoagulation - may need repeat US Follow with ID outpatient  Follow renal function outpatient, still not quite back to his baseline - attention to meds - I think metformin ok for now, follow trend (valsartan currently on hold)  Follow with hand outpatient for incidental findings  Discharge Diagnoses:  Principal Problem:   AKI (acute kidney injury) (HCC) Active Problems:   DM2 (diabetes mellitus, type 2) (HCC)   Essential hypertension   NSVT (nonsustained ventricular tachycardia) (HCC)   Syncope   Hypotension   Chest pressure   Arrhythmia   SOB (shortness of breath)   Discharge Condition: stable  Diet recommendation: heart healthy  Filed Weights   10/08/20 1745 10/10/20 0639 10/11/20 1005  Weight: 121.3 kg 118.3 kg 118.3 kg    History of present illness:  This 80 years old male with PMH significant for type 2 diabetes, hypertension, hyperlipidemia, prior NSVT and symptomatic PVCs treated with beta-blockers , presented in the ED with generalized weakness.  Patient was started on Jardiance 2 weeks ago which made him feel so bad,  he stopped taking it, then he presented with nausea, vomiting and generalized weakness.  He reports he was working in his garden when he became very tired and had to sit down and developed short of breath but denies any chest pain.  He is found to have slightly elevated serum creatinine from his baseline, EKG shows some abnormalities multiple PVCs.  Cardiology was  consulted, recommended to repeat echocardiogram and would not pursue another ischemic evaluation unless there is any change in his systolic function.  Continue Coreg twice.   He's now s/p stress test, area of reduced perfusion concerning for infarct.  Catheterization did not show significant obstructive coronary disease in major arteries.  Recommended risk factor modification.  EP was c/s given freq PCP, recommended beta blocker and outpatient follow up with ziopatch.  Hospitalization complicated by septic thrombophlebitis and strep pyogenes bacteremia.  Being treated with abx and eliquis.  Follow outpatient with EP, ID, vascular planned.  See below for additional details  Hospital Course:  Septic Thrombophlebitis  Sepsis  Strep Pyogenes Bacteremia  Superficial Vein Thrombosis of L Cephalic Vein LUE [place] notable for superficial venous thrombosis - Will treat with anticoagulation for at least 45 days.  Follow outpatient with vascular surgery outpatient to discuss long term management.   Last true fever 8/26 (but elevated temp, 99.9 on 8/27) Strep pyogenes bacteremia Repeat blood cultures NG x 48 hrs Repeat US 8/28 stable.  CT LUE with skin thickening and swelling along the LUE and forearm ID c/s, appreciate recs -> penicillin transitioned to amoxicllin x 14 days (500 mg TID) at the time of discharge.  Follow up with ID outpatient.     Frequent PVC's  Abnormal EKG  Abnormal Stress Test  HFpEF  Mildly Reduced Systolic Function: Echo with EF 40-45%, mildly decreased function - no RWMA Myoview with large area of reduced perfusion at rest and stress in inferior and inferolateral wall from apex to base, concerning for infarct Patient has multiple PVCs on EKG S/p LHC without  Ulnar         Full                                          +----------+------------+---------+-----------+----------+-------+ Cephalic      None       No        No                Acute  +----------+------------+---------+-----------+----------+-------+ Basilic       Full                                          +----------+------------+---------+-----------+----------+-------+  Summary:  Left: No evidence of deep vein thrombosis in the upper extremity. Findings consistent with acute superficial vein thrombosis involving the left cephalic vein. Findings appear essentially unchanged as compared to previous examination.   *See table(s) above for measurements and observations.    Preliminary    VAS [place] UPPER EXTREMITY VENOUS DUPLEX  Result Date: 10/11/2020 UPPER VENOUS STUDY  Patient Name:  Karl Peters  Date of Exam:   10/11/2020 Medical Rec #: [id number]      Accession #:    [id number] Date of Birth: [birth year]     Patient Gender: M Patient Age:   31 years Exam Location:  [facility] Procedure:      VAS [place] UPPER EXTREMITY VENOUS DUPLEX Referring Phys: Karl Peters --------------------------------------------------------------------------------  Indications: pain, edema, and erythema at recent IV site. Comparison Study: No prior study Performing Technologist: Karl Peters  Examination Guidelines: Karl Peters complete evaluation includes B-mode imaging, spectral Doppler, color Doppler, and power Doppler as needed of all accessible portions of each vessel. Bilateral testing is considered an integral part of Karl Peters complete examination. Limited examinations for reoccurring indications may be performed as noted.  Right Findings: +----------+------------+---------+-----------+----------+-------+ RIGHT     CompressiblePhasicitySpontaneousPropertiesSummary +----------+------------+---------+-----------+----------+-------+ Subclavian               Yes       Yes                      +----------+------------+---------+-----------+----------+-------+  Left Findings: +----------+------------+---------+-----------+----------+-------+ LEFT      CompressiblePhasicitySpontaneousPropertiesSummary +----------+------------+---------+-----------+----------+-------+ IJV           Full       Yes       Yes                      +----------+------------+---------+-----------+----------+-------+ Subclavian    Full       Yes       Yes                      +----------+------------+---------+-----------+----------+-------+ Axillary      Full       Yes       Yes                       +----------+------------+---------+-----------+----------+-------+ Brachial      Full       Yes       Yes                      +----------+------------+---------+-----------+----------+-------+ Radial        Full                                          +----------+------------+---------+-----------+----------+-------+  Vmax:   105.63 cm/s LVOT Vmean:  68.733 cm/s LVOT VTI:    0.192 m  AORTA Ao Root diam: 3.30 cm Ao Asc diam:  3.40 cm MITRAL VALVE               TRICUSPID VALVE MV Area (PHT): 3.88 cm    TR Peak grad:   26.8 mmHg MV Decel Time: 195 msec    TR Vmax:        259.00 cm/s MV E velocity: 67.07 cm/s MV Jayne Peckenpaugh velocity: 77.43 cm/s  SHUNTS MV E/Clearnce Leja ratio:  0.87        Systemic VTI:  0.19 m                            Systemic Diam: 1.80 cm Weston Brass MD Electronically signed by Weston Brass MD Signature Date/Time: 10/08/2020/1:27:29 PM    Final    VAS [place] UPPER EXTREMITY VENOUS DUPLEX  Result Date: 10/13/2020 UPPER VENOUS STUDY  Patient Name:  Karl Peters  Date of Exam:   10/13/2020 Medical Rec #: [id number]      Accession #:    [id number] Date of Birth: 1940-09-01     Patient Gender: M Patient Age:   67 years Exam Location:  [facility] Procedure:      VAS [place] UPPER EXTREMITY VENOUS DUPLEX Referring Phys: Terrilynn Postell POWELL Peters --------------------------------------------------------------------------------  Indications: Prior cephalic clot, worsening swelling and erythema Comparison Study: 10-11-2020 Prior left upper extremity venous study was                   positive for acute superficial thrombosis involving  the                   cephalic vein. Performing Technologist: Jean Rosenthal RDMS, RVT  Examination Guidelines: Everlie Eble complete evaluation includes B-mode imaging, spectral Doppler, color Doppler, and power Doppler as needed of all accessible portions of each vessel. Bilateral testing is considered an integral part of Fionn Stracke complete examination. Limited examinations for reoccurring indications may be performed as noted.  Left Findings: +----------+------------+---------+-----------+----------+-------+ LEFT      CompressiblePhasicitySpontaneousPropertiesSummary +----------+------------+---------+-----------+----------+-------+ IJV           Full       Yes       Yes                      +----------+------------+---------+-----------+----------+-------+ Subclavian    Full       Yes       Yes                      +----------+------------+---------+-----------+----------+-------+ Axillary      Full       Yes       Yes                      +----------+------------+---------+-----------+----------+-------+ Brachial      Full                                          +----------+------------+---------+-----------+----------+-------+ Radial        Full                                          +----------+------------+---------+-----------+----------+-------+  measurements: 11.1 x 4.6 x 4.7 cm = volume: 125 mL. Echogenicity within normal limits. No mass or hydronephrosis visualized. Bladder: Appears normal for degree of bladder distention. Other: None. IMPRESSION: Normal ultrasound examination of the kidneys.  No hydronephrosis. Electronically Signed   By: Karl Peters M.D.   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 The study is high risk.   No ST deviation was noted.   LV perfusion is abnormal.   Defect 1: There is Karl Peters large defect with moderate reduction in uptake present in the apical to basal inferior and inferolateral location(s) that is fixed. There is abnormal wall motion in the defect area. Consistent with infarction.   The left ventricular ejection fraction is severely decreased (<30%). Left ventricular function is abnormal. Global function is severely reduced. End diastolic cavity size is moderately enlarged. End systolic cavity size is moderately enlarged. Difficulty in assessment of EF/wall motion due to frequent ectopy. Large area of reduced perfusion at both rest and stress in the inferior and inferolateral wall, from apex to base. Concerning for infarct. Findings communicated with Karl Peters.   DG CHEST PORT 1 VIEW  Result Date: 10/11/2020 CLINICAL DATA:  Fever EXAM: PORTABLE CHEST 1 VIEW COMPARISON:  Portable exam 0832 hours compared to 10/07/2020 FINDINGS: Normal heart size, mediastinal contours, and pulmonary vascularity. Mild  atelectasis at RIGHT base. Lungs otherwise clear. No infiltrate, pleural effusion, or pneumothorax. Prior cervical spine fusion. IMPRESSION: Mild RIGHT basilar atelectasis. Electronically Signed   By: Karl Peters M.D.   On: 10/11/2020 11:03   ECHOCARDIOGRAM COMPLETE  Result Date: 10/08/2020    ECHOCARDIOGRAM REPORT   Patient Name:   Karl Peters Date of Exam: 10/08/2020 Medical Rec #:  [id number]     Height:       68.0 in Accession #:    [id number]    Weight:       287.0 lb Date of Birth:  08-06-1940    BSA:          2.383 m Patient Age:    79 years      BP:           141/72 mmHg Patient Gender: M             HR:           81 bpm. Exam Location:  Inpatient Procedure: 2D Echo, Cardiac Doppler, Color Doppler and Intracardiac            Opacification Agent Indications:    Abnormal ECG R94.31  History:        Patient has prior history of Echocardiogram examinations, most                 recent 03/19/2016. Arrythmias:RBBB, PVC, Bradycardia and                 Ventricular tachycardia. First-degree AV block. Trifascicular                 block; Risk Factors:Hypertension, Diabetes and Dyslipidemia.  Sonographer:    Karl Peters Peters Referring Phys: [phone] Karl Peters  Sonographer Comments: Technically difficult study due to poor echo windows. IMPRESSIONS  1. Left ventricular ejection fraction, by estimation, is 40 to 45% with significant beat to beat variability. The left ventricle has mildly decreased function. The left ventricle has no regional wall motion abnormalities. Left ventricular diastolic parameters are indeterminate.  Vmax:   105.63 cm/s LVOT Vmean:  68.733 cm/s LVOT VTI:    0.192 m  AORTA Ao Root diam: 3.30 cm Ao Asc diam:  3.40 cm MITRAL VALVE               TRICUSPID VALVE MV Area (PHT): 3.88 cm    TR Peak grad:   26.8 mmHg MV Decel Time: 195 msec    TR Vmax:        259.00 cm/s MV E velocity: 67.07 cm/s MV Jayne Peckenpaugh velocity: 77.43 cm/s  SHUNTS MV E/Clearnce Leja ratio:  0.87        Systemic VTI:  0.19 m                            Systemic Diam: 1.80 cm Weston Brass MD Electronically signed by Weston Brass MD Signature Date/Time: 10/08/2020/1:27:29 PM    Final    VAS [place] UPPER EXTREMITY VENOUS DUPLEX  Result Date: 10/13/2020 UPPER VENOUS STUDY  Patient Name:  Karl Peters  Date of Exam:   10/13/2020 Medical Rec #: [id number]      Accession #:    [id number] Date of Birth: 1940-09-01     Patient Gender: M Patient Age:   67 years Exam Location:  [facility] Procedure:      VAS [place] UPPER EXTREMITY VENOUS DUPLEX Referring Phys: Terrilynn Postell POWELL Peters --------------------------------------------------------------------------------  Indications: Prior cephalic clot, worsening swelling and erythema Comparison Study: 10-11-2020 Prior left upper extremity venous study was                   positive for acute superficial thrombosis involving  the                   cephalic vein. Performing Technologist: Jean Rosenthal RDMS, RVT  Examination Guidelines: Everlie Eble complete evaluation includes B-mode imaging, spectral Doppler, color Doppler, and power Doppler as needed of all accessible portions of each vessel. Bilateral testing is considered an integral part of Fionn Stracke complete examination. Limited examinations for reoccurring indications may be performed as noted.  Left Findings: +----------+------------+---------+-----------+----------+-------+ LEFT      CompressiblePhasicitySpontaneousPropertiesSummary +----------+------------+---------+-----------+----------+-------+ IJV           Full       Yes       Yes                      +----------+------------+---------+-----------+----------+-------+ Subclavian    Full       Yes       Yes                      +----------+------------+---------+-----------+----------+-------+ Axillary      Full       Yes       Yes                      +----------+------------+---------+-----------+----------+-------+ Brachial      Full                                          +----------+------------+---------+-----------+----------+-------+ Radial        Full                                          +----------+------------+---------+-----------+----------+-------+  Ulnar         Full                                          +----------+------------+---------+-----------+----------+-------+ Cephalic      None       No        No                Acute  +----------+------------+---------+-----------+----------+-------+ Basilic       Full                                          +----------+------------+---------+-----------+----------+-------+  Summary:  Left: No evidence of deep vein thrombosis in the upper extremity. Findings consistent with acute superficial vein thrombosis involving the left cephalic vein. Findings appear essentially unchanged as compared to previous examination.   *See table(s) above for measurements and observations.    Preliminary    VAS [place] UPPER EXTREMITY VENOUS DUPLEX  Result Date: 10/11/2020 UPPER VENOUS STUDY  Patient Name:  Karl Peters  Date of Exam:   10/11/2020 Medical Rec #: [id number]      Accession #:    [id number] Date of Birth: [birth year]     Patient Gender: M Patient Age:   31 years Exam Location:  [facility] Procedure:      VAS [place] UPPER EXTREMITY VENOUS DUPLEX Referring Phys: Karl Peters --------------------------------------------------------------------------------  Indications: pain, edema, and erythema at recent IV site. Comparison Study: No prior study Performing Technologist: Karl Peters  Examination Guidelines: Karl Peters complete evaluation includes B-mode imaging, spectral Doppler, color Doppler, and power Doppler as needed of all accessible portions of each vessel. Bilateral testing is considered an integral part of Karl Peters complete examination. Limited examinations for reoccurring indications may be performed as noted.  Right Findings: +----------+------------+---------+-----------+----------+-------+ RIGHT     CompressiblePhasicitySpontaneousPropertiesSummary +----------+------------+---------+-----------+----------+-------+ Subclavian               Yes       Yes                      +----------+------------+---------+-----------+----------+-------+  Left Findings: +----------+------------+---------+-----------+----------+-------+ LEFT      CompressiblePhasicitySpontaneousPropertiesSummary +----------+------------+---------+-----------+----------+-------+ IJV           Full       Yes       Yes                      +----------+------------+---------+-----------+----------+-------+ Subclavian    Full       Yes       Yes                      +----------+------------+---------+-----------+----------+-------+ Axillary      Full       Yes       Yes                       +----------+------------+---------+-----------+----------+-------+ Brachial      Full       Yes       Yes                      +----------+------------+---------+-----------+----------+-------+ Radial        Full                                          +----------+------------+---------+-----------+----------+-------+  Physician Discharge Summary  Karl Peters OIB:[id number] DOB: May 30, 1940 DOA: 10/07/2020  PCP: Elizabeth Palau, FNP  Admit date: 10/07/2020 Discharge date: 10/14/2020  Time spent: 40 minutes  Recommendations for Outpatient Follow-up:  Follow outpatient CBC/CMP Follow with cardiology outpatient - follow with EP - follow ziopatch Follow with cardiology outpatient  Septic thrombophelbitis - discharging with eliquis and amoxicillin -> follow up with vascular to help decide on duration of anticoagulation - may need repeat US Follow with ID outpatient  Follow renal function outpatient, still not quite back to his baseline - attention to meds - I think metformin ok for now, follow trend (valsartan currently on hold)  Follow with hand outpatient for incidental findings  Discharge Diagnoses:  Principal Problem:   AKI (acute kidney injury) (HCC) Active Problems:   DM2 (diabetes mellitus, type 2) (HCC)   Essential hypertension   NSVT (nonsustained ventricular tachycardia) (HCC)   Syncope   Hypotension   Chest pressure   Arrhythmia   SOB (shortness of breath)   Discharge Condition: stable  Diet recommendation: heart healthy  Filed Weights   10/08/20 1745 10/10/20 0639 10/11/20 1005  Weight: 121.3 kg 118.3 kg 118.3 kg    History of present illness:  This 80 years old male with PMH significant for type 2 diabetes, hypertension, hyperlipidemia, prior NSVT and symptomatic PVCs treated with beta-blockers , presented in the ED with generalized weakness.  Patient was started on Jardiance 2 weeks ago which made him feel so bad,  he stopped taking it, then he presented with nausea, vomiting and generalized weakness.  He reports he was working in his garden when he became very tired and had to sit down and developed short of breath but denies any chest pain.  He is found to have slightly elevated serum creatinine from his baseline, EKG shows some abnormalities multiple PVCs.  Cardiology was  consulted, recommended to repeat echocardiogram and would not pursue another ischemic evaluation unless there is any change in his systolic function.  Continue Coreg twice.   He's now s/p stress test, area of reduced perfusion concerning for infarct.  Catheterization did not show significant obstructive coronary disease in major arteries.  Recommended risk factor modification.  EP was c/s given freq PCP, recommended beta blocker and outpatient follow up with ziopatch.  Hospitalization complicated by septic thrombophlebitis and strep pyogenes bacteremia.  Being treated with abx and eliquis.  Follow outpatient with EP, ID, vascular planned.  See below for additional details  Hospital Course:  Septic Thrombophlebitis  Sepsis  Strep Pyogenes Bacteremia  Superficial Vein Thrombosis of L Cephalic Vein LUE [place] notable for superficial venous thrombosis - Will treat with anticoagulation for at least 45 days.  Follow outpatient with vascular surgery outpatient to discuss long term management.   Last true fever 8/26 (but elevated temp, 99.9 on 8/27) Strep pyogenes bacteremia Repeat blood cultures NG x 48 hrs Repeat US 8/28 stable.  CT LUE with skin thickening and swelling along the LUE and forearm ID c/s, appreciate recs -> penicillin transitioned to amoxicllin x 14 days (500 mg TID) at the time of discharge.  Follow up with ID outpatient.     Frequent PVC's  Abnormal EKG  Abnormal Stress Test  HFpEF  Mildly Reduced Systolic Function: Echo with EF 40-45%, mildly decreased function - no RWMA Myoview with large area of reduced perfusion at rest and stress in inferior and inferolateral wall from apex to base, concerning for infarct Patient has multiple PVCs on EKG S/p LHC without  Physician Discharge Summary  Karl Peters OIB:[id number] DOB: May 30, 1940 DOA: 10/07/2020  PCP: Elizabeth Palau, FNP  Admit date: 10/07/2020 Discharge date: 10/14/2020  Time spent: 40 minutes  Recommendations for Outpatient Follow-up:  Follow outpatient CBC/CMP Follow with cardiology outpatient - follow with EP - follow ziopatch Follow with cardiology outpatient  Septic thrombophelbitis - discharging with eliquis and amoxicillin -> follow up with vascular to help decide on duration of anticoagulation - may need repeat US Follow with ID outpatient  Follow renal function outpatient, still not quite back to his baseline - attention to meds - I think metformin ok for now, follow trend (valsartan currently on hold)  Follow with hand outpatient for incidental findings  Discharge Diagnoses:  Principal Problem:   AKI (acute kidney injury) (HCC) Active Problems:   DM2 (diabetes mellitus, type 2) (HCC)   Essential hypertension   NSVT (nonsustained ventricular tachycardia) (HCC)   Syncope   Hypotension   Chest pressure   Arrhythmia   SOB (shortness of breath)   Discharge Condition: stable  Diet recommendation: heart healthy  Filed Weights   10/08/20 1745 10/10/20 0639 10/11/20 1005  Weight: 121.3 kg 118.3 kg 118.3 kg    History of present illness:  This 80 years old male with PMH significant for type 2 diabetes, hypertension, hyperlipidemia, prior NSVT and symptomatic PVCs treated with beta-blockers , presented in the ED with generalized weakness.  Patient was started on Jardiance 2 weeks ago which made him feel so bad,  he stopped taking it, then he presented with nausea, vomiting and generalized weakness.  He reports he was working in his garden when he became very tired and had to sit down and developed short of breath but denies any chest pain.  He is found to have slightly elevated serum creatinine from his baseline, EKG shows some abnormalities multiple PVCs.  Cardiology was  consulted, recommended to repeat echocardiogram and would not pursue another ischemic evaluation unless there is any change in his systolic function.  Continue Coreg twice.   He's now s/p stress test, area of reduced perfusion concerning for infarct.  Catheterization did not show significant obstructive coronary disease in major arteries.  Recommended risk factor modification.  EP was c/s given freq PCP, recommended beta blocker and outpatient follow up with ziopatch.  Hospitalization complicated by septic thrombophlebitis and strep pyogenes bacteremia.  Being treated with abx and eliquis.  Follow outpatient with EP, ID, vascular planned.  See below for additional details  Hospital Course:  Septic Thrombophlebitis  Sepsis  Strep Pyogenes Bacteremia  Superficial Vein Thrombosis of L Cephalic Vein LUE [place] notable for superficial venous thrombosis - Will treat with anticoagulation for at least 45 days.  Follow outpatient with vascular surgery outpatient to discuss long term management.   Last true fever 8/26 (but elevated temp, 99.9 on 8/27) Strep pyogenes bacteremia Repeat blood cultures NG x 48 hrs Repeat US 8/28 stable.  CT LUE with skin thickening and swelling along the LUE and forearm ID c/s, appreciate recs -> penicillin transitioned to amoxicllin x 14 days (500 mg TID) at the time of discharge.  Follow up with ID outpatient.     Frequent PVC's  Abnormal EKG  Abnormal Stress Test  HFpEF  Mildly Reduced Systolic Function: Echo with EF 40-45%, mildly decreased function - no RWMA Myoview with large area of reduced perfusion at rest and stress in inferior and inferolateral wall from apex to base, concerning for infarct Patient has multiple PVCs on EKG S/p LHC without

## 2020-10-14 NOTE — Care Management Important Message (Signed)
Important Message  Patient Details  Name: Karl Peters MRN: [id number] Date of Birth: [birth year]   Medicare Important Message Given:  Yes     Renie Ora 10/14/2020, 11:54 AM

## 2020-10-17 ENCOUNTER — Other Ambulatory Visit: Payer: Medicare Other

## 2020-10-17 ENCOUNTER — Other Ambulatory Visit: Payer: Self-pay

## 2020-10-17 DIAGNOSIS — I493 Ventricular premature depolarization: Secondary | ICD-10-CM | POA: Diagnosis not present

## 2020-10-17 DIAGNOSIS — I428 Other cardiomyopathies: Secondary | ICD-10-CM

## 2020-10-17 LAB — CULTURE, BLOOD (ROUTINE X 2)
Culture: NO GROWTH
Culture: NO GROWTH
Special Requests: ADEQUATE
Special Requests: ADEQUATE

## 2020-10-18 LAB — BASIC METABOLIC PANEL
BUN/Creatinine Ratio: 14 (ref 10–24)
BUN: 18 mg/dL (ref 8–27)
CO2: 24 mmol/L (ref 20–29)
Calcium: 9 mg/dL (ref 8.6–10.2)
Chloride: 105 mmol/L (ref 96–106)
Creatinine, Ser: 1.28 mg/dL — ABNORMAL HIGH (ref 0.76–1.27)
Glucose: 101 mg/dL — ABNORMAL HIGH (ref 65–99)
Potassium: 4.6 mmol/L (ref 3.5–5.2)
Sodium: 141 mmol/L (ref 134–144)
eGFR: 57 mL/min/{1.73_m2} — ABNORMAL LOW (ref >59)

## 2020-10-25 ENCOUNTER — Telehealth: Payer: Medicare Other | Admitting: Internal Medicine

## 2020-10-29 ENCOUNTER — Encounter: Payer: Self-pay | Admitting: Internal Medicine

## 2020-10-29 ENCOUNTER — Telehealth: Payer: Medicare Other | Admitting: Internal Medicine

## 2020-10-29 ENCOUNTER — Other Ambulatory Visit: Payer: Self-pay

## 2020-10-29 DIAGNOSIS — R7881 Bacteremia: Secondary | ICD-10-CM | POA: Insufficient documentation

## 2020-10-29 DIAGNOSIS — I809 Phlebitis and thrombophlebitis of unspecified site: Secondary | ICD-10-CM | POA: Insufficient documentation

## 2020-10-29 NOTE — Progress Notes (Signed)
Subjective:    Patient ID: Karl Peters, male    DOB: 1940/06/13, 80 y.o.   MRN: [id number]  HPI Here for hsfu He was hospitalized last month with weakness related to Jardiance and during his hospitalization developed fever and superficial thrombophlebitis at the site of a peripheral IV on his left arm.  His blood cultures were subsequently positive for Streptococcal pyogenes and he was treated with penicillin followed by oral amoxicillin at discharge.  He is doing well with no further fever or chills.  He is about to complete the 14 days of amoxicillin.  Arm feels fine.  No associated rash or diarrhea.    Review of Systems  Constitutional:  Negative for chills and fever.  Gastrointestinal:  Negative for diarrhea and nausea.  Skin:  Negative for rash.      Objective:   Physical Exam Musculoskeletal:     Comments: Left arm with no warmth, minimal induration/firmness at the PIV site.  Good ROM  Neurological:     Mental Status: He is alert.          Assessment & Plan:

## 2020-10-29 NOTE — Assessment & Plan Note (Signed)
Repeat blood cultures remained negative.  No further treatment indicated.

## 2020-10-29 NOTE — Assessment & Plan Note (Addendum)
Resolved at this point, no concerns noted on exam. Follow up as needed

## 2020-11-15 ENCOUNTER — Encounter: Payer: Self-pay | Admitting: Student

## 2020-11-15 ENCOUNTER — Other Ambulatory Visit: Payer: Self-pay

## 2020-11-15 ENCOUNTER — Ambulatory Visit: Payer: Medicare Other | Admitting: Student

## 2020-11-15 VITALS — BP 144/88 | HR 63 | Ht 68.0 in | Wt 261.2 lb

## 2020-11-15 DIAGNOSIS — I4729 Other ventricular tachycardia: Secondary | ICD-10-CM

## 2020-11-15 DIAGNOSIS — I428 Other cardiomyopathies: Secondary | ICD-10-CM | POA: Diagnosis not present

## 2020-11-15 DIAGNOSIS — I493 Ventricular premature depolarization: Secondary | ICD-10-CM

## 2020-11-15 DIAGNOSIS — I2510 Atherosclerotic heart disease of native coronary artery without angina pectoris: Secondary | ICD-10-CM | POA: Diagnosis not present

## 2020-11-15 DIAGNOSIS — I472 Ventricular tachycardia: Secondary | ICD-10-CM | POA: Diagnosis not present

## 2020-11-15 LAB — BASIC METABOLIC PANEL
BUN/Creatinine Ratio: 13 (ref 10–24)
BUN: 16 mg/dL (ref 8–27)
CO2: 24 mmol/L (ref 20–29)
Calcium: 9.2 mg/dL (ref 8.6–10.2)
Chloride: 104 mmol/L (ref 96–106)
Creatinine, Ser: 1.24 mg/dL (ref 0.76–1.27)
Glucose: 126 mg/dL — ABNORMAL HIGH (ref 70–99)
Potassium: 4.5 mmol/L (ref 3.5–5.2)
Sodium: 141 mmol/L (ref 134–144)
eGFR: 60 mL/min/{1.73_m2} (ref >59)

## 2020-11-15 MED ORDER — LOSARTAN POTASSIUM 25 MG PO TABS: 12.5000 mg | ORAL_TABLET | Freq: Every day | ORAL | 3 refills | Status: DC

## 2020-11-15 NOTE — Progress Notes (Signed)
PCP:  Elizabeth Palau, FNP Primary Cardiologist: None Electrophysiologist: Karl Peters   Karl Peters is a 80 y.o. male seen today for Karl Peters for post hospital follow up.  Since discharge from hospital the patient reports doing very well. He has had no further palpitations. No dizziness or lightheadedness. He feels "back to himself". Denies any undue SOB. No CP. No orthopnea or peripheral edema.  Past Medical History:  Diagnosis Date   Diabetes mellitus    Dyslipidemia    Hx of cardiac catheterization    a. normal cors by cath in 1998 and 2010   Hx of echocardiogram    Echocardiogram 06/10/12: Normal LV wall thickness, EF 50-55%, grade 1 diastolic dysfunction, mild LAE   Hypertension    Obesity    Ventricular tachycardia (HCC)    and symptomatic PVCs - Rx with beta blocker   Past Surgical History:  Procedure Laterality Date   ESOPHAGOGASTRODUODENOSCOPY  06/18/2003   negative   LEFT HEART CATH  12/27/2015   Procedure: Left Heart Catheterization W Intervention; Surgeon: Arizona Constable, Peters; Location: [facility] CATH; Service: Cardiology     LEFT HEART CATH AND CORONARY ANGIOGRAPHY N/A 10/10/2020   Procedure: LEFT HEART CATH AND CORONARY ANGIOGRAPHY;  Surgeon: Lyn Records, Peters;  Location: [facility] INVASIVE CV LAB;  Service: Cardiovascular;  Laterality: N/A;    Current Outpatient Medications  Medication Sig Dispense Refill   apixaban (ELIQUIS) 5 MG TABS tablet Take 1 tablet (5 mg total) by mouth 2 (two) times daily. 60 tablet 0   buPROPion (WELLBUTRIN XL) 300 MG 24 hr tablet Take 300 mg by mouth every morning.     glimepiride (AMARYL) 4 MG tablet Take 4 mg by mouth every morning.     hydrALAZINE (APRESOLINE) 10 MG tablet Take 1 tablet (10 mg total) by mouth every 8 (eight) hours. 90 tablet 1   isosorbide mononitrate (IMDUR) 30 MG 24 hr tablet Take 0.5 tablets (15 mg total) by mouth daily. 15 tablet 1   metFORMIN (GLUCOPHAGE) 500 MG tablet Take 1,000 mg by mouth every  morning.     metoprolol succinate (TOPROL-XL) 50 MG 24 hr tablet Take 1 tablet (50 mg total) by mouth daily. Take with or immediately following a meal. 30 tablet 1   montelukast (SINGULAIR) 10 MG tablet Take 10 mg by mouth every morning.     pantoprazole (PROTONIX) 40 MG tablet Take 40 mg by mouth every morning.     rosuvastatin (CRESTOR) 20 MG tablet Take 2 tablets (40 mg total) by mouth every morning. 60 tablet 0   vitamin B-12 (CYANOCOBALAMIN) 1000 MCG tablet Take 1,000 mcg by mouth every morning.     No current facility-administered medications for this visit.    Allergies  Allergen Reactions   Jardiance [Empagliflozin] Nausea Only    Social History   Socioeconomic History   Marital status: Single    Spouse name: Not on file   Number of children: Not on file   Years of education: Not on file   Highest education level: Not on file  Occupational History   Not on file  Tobacco Use   Smoking status: Never   Smokeless tobacco: Never  Substance and Sexual Activity   Alcohol use: No   Drug use: Not on file   Sexual activity: Not on file  Other Topics Concern   Not on file  Social History Narrative   Not on file   Social Determinants of Health   Financial  Resource Strain: Not on file  Food Insecurity: Not on file  Transportation Needs: Not on file  Physical Activity: Not on file  Stress: Not on file  Social Connections: Not on file  Intimate Partner Violence: Not on file     Review of Systems: All other systems reviewed and are otherwise negative except as noted above.  Physical Exam: Vitals:   11/15/20 1053  BP: (!) 144/88  Pulse: 63  SpO2: 96%  Weight: 261 lb 3.2 oz (118.5 kg)  Height: 5\' 8"  (1.727 m)    GEN- The patient is well appearing, alert and oriented x 3 today.   HEENT: normocephalic, atraumatic; sclera clear, conjunctiva pink; hearing intact; oropharynx clear; neck supple, no JVP Lymph- no cervical lymphadenopathy Lungs- Clear to ausculation  bilaterally, normal work of breathing.  No wheezes, rales, rhonchi Heart- Regular rate and rhythm, no murmurs, rubs or gallops, PMI not laterally displaced GI- soft, non-tender, non-distended, bowel sounds present, no hepatosplenomegaly Extremities- no clubbing, cyanosis, or edema; DP/PT/radial pulses 2+ bilaterally MS- no significant deformity or atrophy Skin- warm and dry, no rash or lesion Psych- euthymic mood, full affect Neuro- strength and sensation are intact  EKG is ordered. Personal review of EKG from today shows NSR at 63 bpm with trifascicular block (PR interval 232 ms and QRS 136 ms)  Additional studies reviewed include: Previous EP office and recent admission/consult notes  Patient had a min HR of 49 bpm, max HR of 187 bpm, and avg HR of 66 bpm. Predominant underlying rhythm was Sinus Rhythm. First Degree AV Block was present. Bundle Branch Block/IVCD was present. 662 Ventricular Tachycardia runs occurred, the run with the  fastest interval lasting 4 beats with a max rate of 187 bpm, the longest lasting 10 beats with an avg rate of 122 bpm. 1 run of Supraventricular Tachycardia occurred lasting 10 beats with a max rate of 130 bpm (avg 115 bpm). Isolated SVEs were rare  (<1.0%), SVE Couplets were rare (<1.0%), and SVE Triplets were rare (<1.0%). Isolated VEs were frequent (11.3%, 141170), VE Couplets were occasional (2.6%, 16045), and VE Triplets were occasional (1.5%, 6333). Ventricular Bigeminy and Trigeminy were  present.  Assessment and Plan:  PVCs, NSVT Chronic history.  Continue toprol which was a recent change from coreg.  He has trifascicular block  Recent monitor with burden of 11.3%. He has trifascicular block. I don't think we can increase his BB or consider amiodarone without a high likelihood of bradycardia or worsening AV block.   He has no PVCs on EKG today.     2. NICM LVEF 40-45% 09/2020. Planning for repeat with recent admission and beat to beat variability  due to frequent ectopy that admission.  Continue toprol Add low dose losartan at 12.5 mg qhs. No MRA for now with recent AKI and admission in setting of dehydration on Jardiance He does not require diuretics.   3. 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 Sooner pending Echo results. If EF remains depressed will consider referring to gen cards in [place] for ease of travel (Pt lives in [place])  Vasto, Graciella Freer  11/15/20 11:28 AM

## 2020-11-15 NOTE — Patient Instructions (Signed)
Medication Instructions:  Your physician has recommended you make the following change in your medication:   START: Losartan 12.5mg  daily at bedtime  *If you need a refill on your cardiac medications before your next appointment, please call your pharmacy*   Lab Work: TODAY: BMET BMET in 10 days  If you have labs (blood work) drawn today and your tests are completely normal, you will receive your results only by: MyChart Message (if you have MyChart) OR A paper copy in the mail If you have any lab test that is abnormal or we need to change your treatment, we will call you to review the results.   Follow-Up: At [facility], you and your health needs are our priority.  As part of our continuing mission to provide you with exceptional heart care, we have created designated Provider Care Teams.  These Care Teams include your primary Cardiologist (physician) and Advanced Practice Providers (APPs -  Physician Assistants and Nurse Practitioners) who all work together to provide you with the care you need, when you need it.  We recommend signing up for the patient portal called "MyChart".  Sign up information is provided on this After Visit Summary.  MyChart is used to connect with patients for Virtual Visits (Telemedicine).  Patients are able to view lab/test results, encounter notes, upcoming appointments, etc.  Non-urgent messages can be sent to your provider as well.   To learn more about what you can do with MyChart, go to [email or web address].    Your next appointment:   6 month(s)  The format for your next appointment:   In Person  Provider:   Casimiro Needle "Otilio Saber, PA-C

## 2020-11-25 ENCOUNTER — Other Ambulatory Visit: Payer: Medicare Other

## 2020-12-10 ENCOUNTER — Ambulatory Visit (HOSPITAL_COMMUNITY): Payer: Medicare Other | Attending: Internal Medicine

## 2020-12-10 ENCOUNTER — Other Ambulatory Visit: Payer: Self-pay

## 2020-12-10 DIAGNOSIS — I493 Ventricular premature depolarization: Secondary | ICD-10-CM | POA: Diagnosis not present

## 2020-12-10 DIAGNOSIS — I428 Other cardiomyopathies: Secondary | ICD-10-CM | POA: Diagnosis not present

## 2020-12-10 LAB — ECHOCARDIOGRAM COMPLETE
Area-P 1/2: 3.03 cm2
S' Lateral: 3.9 cm

## 2020-12-10 MED ORDER — PERFLUTREN LIPID MICROSPHERE
1.0000 mL | INTRAVENOUS | Status: AC | PRN
  Administered 2020-12-10: 3 mL via INTRAVENOUS

## 2021-04-04 DIAGNOSIS — F329 Major depressive disorder, single episode, unspecified: Secondary | ICD-10-CM | POA: Insufficient documentation

## 2021-04-07 ENCOUNTER — Telehealth: Payer: Self-pay

## 2021-04-07 NOTE — Telephone Encounter (Signed)
NOTES SCANNED TO REFERRAL 

## 2021-04-10 ENCOUNTER — Emergency Department (HOSPITAL_COMMUNITY): Payer: Medicare Other

## 2021-04-10 ENCOUNTER — Other Ambulatory Visit: Payer: Self-pay

## 2021-04-10 ENCOUNTER — Encounter (HOSPITAL_COMMUNITY): Payer: Self-pay

## 2021-04-10 ENCOUNTER — Inpatient Hospital Stay (HOSPITAL_COMMUNITY)
Admission: EM | Admit: 2021-04-10 | Discharge: 2021-04-12 | DRG: 291 | Disposition: A | Discharge status: Home / Self Care | Payer: Medicare Other | Attending: Internal Medicine | Admitting: Internal Medicine

## 2021-04-10 DIAGNOSIS — Z7901 Long term (current) use of anticoagulants: Secondary | ICD-10-CM

## 2021-04-10 DIAGNOSIS — I493 Ventricular premature depolarization: Secondary | ICD-10-CM | POA: Diagnosis not present

## 2021-04-10 DIAGNOSIS — I2510 Atherosclerotic heart disease of native coronary artery without angina pectoris: Secondary | ICD-10-CM

## 2021-04-10 DIAGNOSIS — I472 Ventricular tachycardia, unspecified: Secondary | ICD-10-CM | POA: Diagnosis present

## 2021-04-10 DIAGNOSIS — I10 Essential (primary) hypertension: Secondary | ICD-10-CM | POA: Diagnosis present

## 2021-04-10 DIAGNOSIS — I5033 Acute on chronic diastolic (congestive) heart failure: Secondary | ICD-10-CM

## 2021-04-10 DIAGNOSIS — Z6841 Body Mass Index (BMI) 40.0 and over, adult: Secondary | ICD-10-CM

## 2021-04-10 DIAGNOSIS — I5023 Acute on chronic systolic (congestive) heart failure: Secondary | ICD-10-CM

## 2021-04-10 DIAGNOSIS — N179 Acute kidney failure, unspecified: Secondary | ICD-10-CM | POA: Diagnosis present

## 2021-04-10 DIAGNOSIS — I498 Other specified cardiac arrhythmias: Secondary | ICD-10-CM | POA: Diagnosis not present

## 2021-04-10 DIAGNOSIS — I110 Hypertensive heart disease with heart failure: Secondary | ICD-10-CM | POA: Diagnosis not present

## 2021-04-10 DIAGNOSIS — Z8249 Family history of ischemic heart disease and other diseases of the circulatory system: Secondary | ICD-10-CM

## 2021-04-10 DIAGNOSIS — Z833 Family history of diabetes mellitus: Secondary | ICD-10-CM

## 2021-04-10 DIAGNOSIS — Z20822 Contact with and (suspected) exposure to covid-19: Secondary | ICD-10-CM | POA: Diagnosis present

## 2021-04-10 DIAGNOSIS — I428 Other cardiomyopathies: Secondary | ICD-10-CM

## 2021-04-10 DIAGNOSIS — E785 Hyperlipidemia, unspecified: Secondary | ICD-10-CM | POA: Diagnosis present

## 2021-04-10 DIAGNOSIS — I509 Heart failure, unspecified: Secondary | ICD-10-CM

## 2021-04-10 DIAGNOSIS — I453 Trifascicular block: Secondary | ICD-10-CM | POA: Diagnosis present

## 2021-04-10 DIAGNOSIS — Z888 Allergy status to other drugs, medicaments and biological substances status: Secondary | ICD-10-CM

## 2021-04-10 DIAGNOSIS — Z79899 Other long term (current) drug therapy: Secondary | ICD-10-CM

## 2021-04-10 DIAGNOSIS — I4729 Other ventricular tachycardia: Secondary | ICD-10-CM | POA: Diagnosis present

## 2021-04-10 DIAGNOSIS — G4733 Obstructive sleep apnea (adult) (pediatric): Secondary | ICD-10-CM | POA: Diagnosis present

## 2021-04-10 DIAGNOSIS — Z7984 Long term (current) use of oral hypoglycemic drugs: Secondary | ICD-10-CM

## 2021-04-10 DIAGNOSIS — E119 Type 2 diabetes mellitus without complications: Secondary | ICD-10-CM

## 2021-04-10 HISTORY — DX: Acute on chronic diastolic (congestive) heart failure: I50.33

## 2021-04-10 LAB — BASIC METABOLIC PANEL
Anion gap: 9 (ref 5–15)
BUN: 25 mg/dL — ABNORMAL HIGH (ref 8–23)
CO2: 26 mmol/L (ref 22–32)
Calcium: 9.1 mg/dL (ref 8.9–10.3)
Chloride: 104 mmol/L (ref 98–111)
Creatinine, Ser: 1.59 mg/dL — ABNORMAL HIGH (ref 0.61–1.24)
GFR, Estimated: 44 mL/min — ABNORMAL LOW (ref >60)
Glucose, Bld: 82 mg/dL (ref 70–99)
Potassium: 4.2 mmol/L (ref 3.5–5.1)
Sodium: 139 mmol/L (ref 135–145)

## 2021-04-10 LAB — CBC
HCT: 41.4 % (ref 39.0–52.0)
Hemoglobin: 13.1 g/dL (ref 13.0–17.0)
MCH: 27.6 pg (ref 26.0–34.0)
MCHC: 31.6 g/dL (ref 30.0–36.0)
MCV: 87.3 fL (ref 80.0–100.0)
Platelets: 244 10*3/uL (ref 150–400)
RBC: 4.74 MIL/uL (ref 4.22–5.81)
RDW: 15.1 % (ref 11.5–15.5)
WBC: 6.8 10*3/uL (ref 4.0–10.5)
nRBC: 0 % (ref 0.0–0.2)

## 2021-04-10 LAB — TROPONIN I (HIGH SENSITIVITY)
Troponin I (High Sensitivity): 19 ng/L — ABNORMAL HIGH (ref <18)
Troponin I (High Sensitivity): 20 ng/L — ABNORMAL HIGH (ref <18)

## 2021-04-10 LAB — MRSA NEXT GEN BY PCR, NASAL: MRSA by PCR Next Gen: NOT DETECTED

## 2021-04-10 LAB — BRAIN NATRIURETIC PEPTIDE: B Natriuretic Peptide: 1143.3 pg/mL — ABNORMAL HIGH (ref 0.0–100.0)

## 2021-04-10 LAB — RESP PANEL BY RT-PCR (FLU A&B, COVID) ARPGX2
Influenza A by PCR: NEGATIVE
Influenza B by PCR: NEGATIVE
SARS Coronavirus 2 by RT PCR: NEGATIVE

## 2021-04-10 LAB — TSH: TSH: 2.037 u[IU]/mL (ref 0.350–4.500)

## 2021-04-10 MED ORDER — FUROSEMIDE 10 MG/ML IJ SOLN
80.0000 mg | Freq: Once | INTRAMUSCULAR | Status: AC
  Administered 2021-04-10: 80 mg via INTRAVENOUS
  Filled 2021-04-10: qty 8

## 2021-04-10 MED ORDER — HEPARIN SODIUM (PORCINE) 5000 UNIT/ML IJ SOLN
5000.0000 [IU] | Freq: Three times a day (TID) | INTRAMUSCULAR | Status: DC
  Administered 2021-04-10 – 2021-04-12 (×5): 5000 [IU] via SUBCUTANEOUS
  Filled 2021-04-10 (×5): qty 1

## 2021-04-10 MED ORDER — ISOSORBIDE MONONITRATE ER 30 MG PO TB24: 15.0000 mg | ORAL_TABLET | Freq: Every day | ORAL | Status: DC

## 2021-04-10 MED ORDER — HYDRALAZINE HCL 25 MG PO TABS: 25.0000 mg | ORAL_TABLET | Freq: Three times a day (TID) | ORAL | Status: DC

## 2021-04-10 MED ORDER — METOPROLOL SUCCINATE ER 25 MG PO TB24
50.0000 mg | ORAL_TABLET | Freq: Every day | ORAL | Status: DC
  Administered 2021-04-10 – 2021-04-12 (×3): 50 mg via ORAL
  Filled 2021-04-10 (×3): qty 2

## 2021-04-10 MED ORDER — FUROSEMIDE 10 MG/ML IJ SOLN
40.0000 mg | Freq: Once | INTRAMUSCULAR | Status: AC
  Administered 2021-04-10: 40 mg via INTRAVENOUS
  Filled 2021-04-10: qty 4

## 2021-04-10 MED ORDER — MONTELUKAST SODIUM 10 MG PO TABS
10.0000 mg | ORAL_TABLET | Freq: Every morning | ORAL | Status: DC
  Administered 2021-04-11 – 2021-04-12 (×2): 10 mg via ORAL
  Filled 2021-04-10 (×2): qty 1

## 2021-04-10 MED ORDER — ONDANSETRON HCL 4 MG/2ML IJ SOLN: 4.0000 mg | Freq: Four times a day (QID) | INTRAMUSCULAR | Status: DC | PRN

## 2021-04-10 MED ORDER — ROSUVASTATIN CALCIUM 20 MG PO TABS: 40.0000 mg | ORAL_TABLET | Freq: Every morning | ORAL | Status: DC

## 2021-04-10 MED ORDER — ROSUVASTATIN CALCIUM 20 MG PO TABS
40.0000 mg | ORAL_TABLET | Freq: Every morning | ORAL | Status: DC
Start: 2021-04-11 — End: 2021-04-12
  Administered 2021-04-11 – 2021-04-12 (×2): 40 mg via ORAL
  Filled 2021-04-10 (×2): qty 2

## 2021-04-10 MED ORDER — PANTOPRAZOLE SODIUM 40 MG PO TBEC
40.0000 mg | DELAYED_RELEASE_TABLET | Freq: Every morning | ORAL | Status: DC
  Administered 2021-04-11 – 2021-04-12 (×2): 40 mg via ORAL
  Filled 2021-04-10 (×2): qty 1

## 2021-04-10 MED ORDER — ASPIRIN EC 81 MG PO TBEC
81.0000 mg | DELAYED_RELEASE_TABLET | Freq: Every day | ORAL | Status: DC
  Administered 2021-04-11 – 2021-04-12 (×2): 81 mg via ORAL
  Filled 2021-04-10 (×2): qty 1

## 2021-04-10 MED ORDER — BUPROPION HCL ER (XL) 300 MG PO TB24
300.0000 mg | ORAL_TABLET | Freq: Every morning | ORAL | Status: DC
  Administered 2021-04-11 – 2021-04-12 (×2): 300 mg via ORAL
  Filled 2021-04-10 (×2): qty 1

## 2021-04-10 MED ORDER — ACETAMINOPHEN 325 MG PO TABS: 650.0000 mg | ORAL_TABLET | ORAL | Status: DC | PRN

## 2021-04-10 MED ORDER — NITROGLYCERIN 0.4 MG SL SUBL: 0.4000 mg | SUBLINGUAL_TABLET | SUBLINGUAL | Status: DC | PRN

## 2021-04-10 NOTE — H&P (Addendum)
See consult note for details.   Patient had originally been planned for admission- due to hesitancy for wanted admission was then consult note.  After discussion was amenable for admission.  Seen Consult for admission details.  Riley Lam, MD Cardiologist [facility]  [address] [place], [address] [place], [place] [address] [phone]  4:27 PM

## 2021-04-10 NOTE — ED Notes (Signed)
Pt c/o generally not feeling well since hospital stay in August.   C/o SOB w/ exertion and increasing swelling in legs.  Pt has runs of V Tach and is followed by Cardiology.

## 2021-04-10 NOTE — ED Provider Notes (Signed)
[place] EMERGENCY DEPARTMENT  Provider Note  CSN: [id number] Arrival date & time: 04/10/21 1009  History Chief Complaint  Patient presents with   Shortness of Breath    Karl Peters is a 81 y.o. male with history of PVCs and NSVT reports 2 weeks of worsening SOB, worse with walking. No orthopnea and swelling to his legs which is worse from baseline. Has not had much cough or fever. No chest pains. He was feeling more weak recently too. He was previously on lasix but not taking that any more. He had an admission for symptomatic bigeminy in 2022, started on betablocker then but did not have PVCs at Cards follow up. He saw his PCP last week for the SOB and was scheduled for outpatient echo but went to UC today for worsening symptoms and was directed to the ED.    Home Medications Prior to Admission medications   Medication Sig Start Date End Date Taking? Authorizing Provider  buPROPion (WELLBUTRIN XL) 300 MG 24 hr tablet Take 300 mg by mouth every morning.   Yes [provider]  glimepiride (AMARYL) 4 MG tablet Take 4 mg by mouth every morning. 11/22/17  Yes [provider]  losartan (COZAAR) 25 MG tablet Take 0.5 tablets (12.5 mg total) by mouth at bedtime. 11/15/20 04/10/21 Yes Tillery, Satira Mccallum, PA-C  metFORMIN (GLUCOPHAGE) 500 MG tablet Take 1,000 mg by mouth every morning.   Yes [provider]  metoprolol succinate (TOPROL-XL) 50 MG 24 hr tablet Take 1 tablet (50 mg total) by mouth daily. Take with or immediately following a meal. 10/15/20 04/10/21 Yes Elodia [place]., MD  montelukast (SINGULAIR) 10 MG tablet Take 10 mg by mouth every morning. 05/16/17  Yes [provider]  pantoprazole (PROTONIX) 40 MG tablet Take 40 mg by mouth every morning.   Yes [provider]  rosuvastatin (CRESTOR) 40 MG tablet Take 40 mg by mouth daily. 04/04/21  Yes [provider]  apixaban (ELIQUIS) 5 MG TABS tablet Take 1  tablet (5 mg total) by mouth 2 (two) times daily. Patient not taking: Reported on 04/10/2021 10/14/20 11/15/20  Elodia [place]., MD  hydrALAZINE (APRESOLINE) 10 MG tablet Take 1 tablet (10 mg total) by mouth every 8 (eight) hours. Patient not taking: Reported on 04/10/2021 10/14/20 11/15/20  Elodia [place]., MD  isosorbide mononitrate (IMDUR) 30 MG 24 hr tablet Take 0.5 tablets (15 mg total) by mouth daily. Patient not taking: Reported on 04/10/2021 10/15/20 04/10/21  Elodia [place]., MD  rosuvastatin (CRESTOR) 20 MG tablet Take 2 tablets (40 mg total) by mouth every morning. Patient not taking: Reported on 04/10/2021 10/14/20 11/15/20  Elodia [place]., MD  vitamin B-12 (CYANOCOBALAMIN) 1000 MCG tablet Take 1,000 mcg by mouth every morning. Patient not taking: Reported on 04/10/2021    [provider]     Allergies    Jardiance [empagliflozin]   Review of Systems   Review of Systems Please see HPI for pertinent positives and negatives  Physical Exam BP (!) 159/91    Pulse 72    Temp 98.7 F (37.1 C) (Oral)    Resp 18    Ht 5\' 8"  (1.727 m)    Wt 120.7 kg    SpO2 97%    BMI 40.45 kg/m   Physical Exam Vitals and nursing note reviewed.  Constitutional:      Appearance: Normal appearance.  HENT:     Head: Normocephalic  and atraumatic.     Nose: Nose normal.     Mouth/Throat:     Mouth: Mucous membranes are moist.  Eyes:     Extraocular Movements: Extraocular movements intact.     Conjunctiva/sclera: Conjunctivae normal.  Cardiovascular:     Rate and Rhythm: Normal rate. Rhythm irregular.  Pulmonary:     Effort: Pulmonary effort is normal.     Breath sounds: Normal breath sounds. No wheezing or rales.  Abdominal:     General: Abdomen is flat.     Palpations: Abdomen is soft.     Tenderness: There is no abdominal tenderness.  Musculoskeletal:        General: No swelling. Normal range of motion.     Cervical back: Neck supple.     Right lower leg:  Edema present.     Left lower leg: Edema present.  Skin:    General: Skin is warm and dry.  Neurological:     General: No focal deficit present.     Mental Status: He is alert.  Psychiatric:        Mood and Affect: Mood normal.    ED Results / Procedures / Treatments   EKG EKG Interpretation  Date/Time:  Thursday April 10 2021 10:17:46 EST Ventricular Rate:  75 PR Interval:  224 QRS Duration: 132 QT Interval:  456 QTC Calculation: 509 R Axis:   -77 Text Interpretation: Sinus rhythm with 1st degree A-V block with frequent Premature ventricular complexes in a pattern of bigeminy Left axis deviation Right bundle branch block Inferior infarct , age undetermined Anterior infarct , age undetermined Abnormal ECG When compared with ECG of 07-Oct-2020 16:44, No significant change since last tracing Confirmed by Calvert Cantor [phone]) on 04/10/2021 10:55:25 AM  Procedures Procedures  Medications Ordered in the ED Medications  furosemide (LASIX) injection 40 mg (40 mg Intravenous Given 04/10/21 1344)    Initial Impression and Plan  Patient with known PVCs, here with increasing SOB and DOE for 2 weeks also having LE edema although he states that is not unusual for him. He has not been gaining weight (actually reports weight loss in the last few days due to poor appetite). He is currently in Bigeminy on monitor. Will check labs, CXR and continue to monitor in the ED.   ED Course   Clinical Course as of 04/10/21 1518  Thu Apr 10, 2021  1125 I personally viewed the images from radiology studies and agree with radiologist interpretation: CXR with cardiomegaly and vascular congestion, no frank edema.   [CS]  1315 CBC is normal. Trop borderline elevated. BMP with mild worsening of CKD.  [CS]  [id number] Covid is neg. BNP is elevated, consistent with likely CHF. Will give a dose of Lasix in the ED.  [CS]  1413 Spoke with Dr. Gasper Sells, Cardiology, who will come see the patient to determine  if he would benefit from admission or not. Patient is otherwise well appearing. Continues to have frequent PVCs on monitor but not in any distress.  [CS]  [id number] Repeat Trop is unchanged.  [CS]  [id number] Care of the patient signed out to Dr. Roslynn Amble at the change of shift pending Cardiology consult.  [CS]    Clinical Course User Index [CS] Truddie Hidden, MD     MDM Rules/Calculators/A&P Medical Decision Making Problems Addressed: Acute congestive heart failure, unspecified heart failure type [facility]): acute illness or injury Bigeminy: chronic illness or injury  Amount and/or Complexity of Data Reviewed Labs: ordered. Decision-making details  documented in ED Course. Radiology: ordered and independent interpretation performed. Decision-making details documented in ED Course. ECG/medicine tests: ordered and independent interpretation performed. Decision-making details documented in ED Course.  Risk Prescription drug management. Decision regarding hospitalization.    Final Clinical Impression(s) / ED Diagnoses Final diagnoses:  Acute congestive heart failure, unspecified heart failure type [facility])  Bigeminy    Rx / DC Orders ED Discharge Orders     None        Truddie Hidden, MD 04/10/21 [phone]

## 2021-04-10 NOTE — ED Provider Triage Note (Signed)
Emergency Medicine Provider Triage Evaluation Note  Karl Peters , a 81 y.o. male  was evaluated in triage.  Pt complains of dyspnea on exertion.  Patient states that for "past 2 to 3 weeks" he has had worsening dyspnea on exertion.  He states that he feels mostly okay at rest however he is having decreased ability to get around his house due to shortness of breath.  He states that he has increased fluid on him and does not take any diuretics.  He additionally endorses cough and fatigue.  He denies any fevers, chest pain, abdominal pain, nausea vomiting or diarrhea.  Review of Systems  Positive: See above Negative:   Physical Exam  BP (!) 143/104 (BP Location: Right Arm)    Pulse (!) 36    Temp 98.7 F (37.1 C) (Oral)    Resp 20    Ht 5\' 8"  (1.727 m)    Wt 120.7 kg    SpO2 99%    BMI 40.45 kg/m  Gen:   Awake, no distress   Resp:  Normal effort, rhonchi in left upper lobe, clear otherwise MSK:   Moves extremities without difficulty  Other:  2-3+ pitting edema bilateral lower extremities.  Medical Decision Making  Medically screening exam initiated at 10:24 AM.  Appropriate orders placed.  was informed that the remainder of the evaluation will be completed by another provider, this initial triage assessment does not replace that evaluation, and the importance of remaining in the ED until their evaluation is complete.     Jeannetta Ellis, PA-C 04/10/21 1025

## 2021-04-10 NOTE — Consult Note (Addendum)
Cardiology Consultation:   Patient ID: Karl Peters MRN: [id number]; DOB: 1940/08/24  Admit date: 04/10/2021 Date of Consult: 04/10/2021  PCP:  Elizabeth Palau, FNP   [facility] Providers Cardiologist:  None  Electrophysiologist:  [name], MD  {  Patient Profile:   Karl Peters is a 81 y.o. male with a hx of PVC, nonsustained VT, nonobstructive CAD, nonischemic cardiomyopathy, hypertension, hyperlipidemia, diabetes mellitus, obesity, sleep apnea (not using CPAP for long time) who is being seen 04/10/2021 for the evaluation of heart failure and ventricular ectopy at the request of Dr. Bernette Mayers.  Patient has a longstanding history of PVC and nonsustained VT dating back to 2010.  Previously on carvedilol which changed to Toprol.  Has underlying trifascicular block, limiting up titration of AV nodal blocking agent or addition of antiarrhythmic.  History of dehydration on Jardiance.  Echocardiogram August 2022 with low normal LV function at 40 to 45%.  Had a high risk stress test August 2022.  Underwent cardiac catheterization for further evaluation showing nonobstructive CAD.  Monitor 10/2020 with 11.3% burden of PVCs/nonsustained VT. Seen by EP APP, recommended continue current dose of beta-blocker.  Echocardiogram October 2022 showed improved LV function to 50 to 55% and grade 1 diastolic dysfunction.  History of Present Illness:   Karl Peters has not felt well for past few months.  Reported fatigue and exertional shortness of breath.  Symptoms worsen in past 2 weeks.  Noted lower extremity edema.  Sleeps on 1 pillow.  No chest pain.  No regular exercise.  He was using CPAP many years ago but has stopped using it for unknown reason.  Denies adding extra salt however does eat food high in sodium.  No regular weight check.  Due to worsening shortness of breath he came to ER for further evaluation.  BNP 1143 High-sensitivity troponin 19>> 20  Creatinine 1.59 (baseline Scr was 1.2-1.3 on  10/2020) Hemoglobin 13.1 Chest x-ray showing cardiomegaly and pulmonary edema  Patient is given IV Lasix 40 mg x 1 in ER.   Past Medical History:  Diagnosis Date   Diabetes mellitus    Dyslipidemia    Hx of cardiac catheterization    a. normal cors by cath in 1998 and 2010   Hx of echocardiogram    Echocardiogram 06/10/12: Normal LV wall thickness, EF 50-55%, grade 1 diastolic dysfunction, mild LAE   Hypertension    Obesity    Ventricular tachycardia    and symptomatic PVCs - Rx with beta blocker    Past Surgical History:  Procedure Laterality Date   ESOPHAGOGASTRODUODENOSCOPY  06/18/2003   negative   LEFT HEART CATH  12/27/2015   Procedure: Left Heart Catheterization W Intervention; Surgeon: Arizona Constable, MD; Location: [facility] CATH; Service: Cardiology     LEFT HEART CATH AND CORONARY ANGIOGRAPHY N/A 10/10/2020   Procedure: LEFT HEART CATH AND CORONARY ANGIOGRAPHY;  Surgeon: Lyn Records, MD;  Location: [facility] INVASIVE CV LAB;  Service: Cardiovascular;  Laterality: N/A;   Inpatient Medications: Scheduled Meds:  Continuous Infusions:  PRN Meds:  Allergies:    Allergies  Allergen Reactions   Jardiance [Empagliflozin] Nausea Only   Social History:   Social History   Socioeconomic History   Marital status: Single    Spouse name: Not on file   Number of children: Not on file   Years of education: Not on file   Highest education level: Not on file  Occupational History   Not on file  Tobacco Use  Smoking status: Never   Smokeless tobacco: Never  Substance and Sexual Activity   Alcohol use: No   Drug use: Not on file   Sexual activity: Not on file  Other Topics Concern   Not on file  Social History Narrative   Not on file   Social Determinants of Health   Financial Resource Strain: Not on file  Food Insecurity: Not on file  Transportation Needs: Not on file  Physical Activity: Not on file  Stress: Not on file  Social Connections: Not on file   Intimate Partner Violence: Not on file    Family History:   Family History  Problem Relation Age of Onset   Heart attack Father 4       Died at age 72 from heart attack   Heart failure Mother 61       Died from heart failure   Lymphoma Mother    Diabetes Mother    Diabetes Mellitus II Maternal Grandmother    Other Maternal Grandfather        head injury   Prostate cancer Paternal Grandfather    Depression Daughter    Rheum arthritis Daughter    Anxiety disorder Daughter      ROS:  Please see the history of present illness.  All other ROS reviewed and negative.     Physical Exam/Data:   Vitals:   04/10/21 1300 04/10/21 1345 04/10/21 1430 04/10/21 1500  BP: (!) 175/117 (!) 165/88 (!) 177/82 (!) 159/91  Pulse: 69 69 (!) 35 72  Resp: 17 (!) 21 18 18   Temp:      TempSrc:      SpO2: 97% 97% 96% 97%  Weight:      Height:       No intake or output data in the 24 hours ending 04/10/21 1515 Last 3 Weights 04/10/2021 11/15/2020 10/29/2020  Weight (lbs) 266 lb 261 lb 3.2 oz 260 lb  Weight (kg) 120.657 kg 118.48 kg 117.935 kg     Body mass index is 40.45 kg/m.  General: Obese male in no acute distress HEENT: normal Neck: JVD difficult to evaluate due to beard Vascular: No carotid bruits; Distal pulses 2+ bilaterally Cardiac:  normal S1, S2; irregular no murmur  Lungs:  clear to auscultation bilaterally, no wheezing, rhonchi or rales  Abd: soft, nontender, no hepatomegaly  Ext: 1-2+ edema R > L Musculoskeletal:  No deformities, BUE and BLE strength normal and equal Skin: warm and dry  Neuro:  CNs 2-12 intact, no focal abnormalities noted Psych:  Normal affect   EKG:  The EKG was personally reviewed and demonstrates: Sinus rhythm, heart rate 75, PVC, ventricular bigeminy Telemetry:  Telemetry was personally reviewed and demonstrates: Sinus rhythm with frequent PVC and nonsustained VT  Relevant CV Studies:  Echo 12/10/2020  1. Left ventricular ejection fraction, by  estimation, is 50 to 55%. The  left ventricle has low normal function. Left ventricular diastolic  parameters are consistent with Grade I diastolic dysfunction (impaired  relaxation).   2. Right ventricular systolic function is normal. The right ventricular  size is normal. There is normal pulmonary artery systolic pressure.   3. Left atrial size was mildly dilated.   4. The mitral valve is normal in structure. No evidence of mitral valve  regurgitation.   5. The aortic valve is normal in structure. Aortic valve regurgitation is  not visualized. No aortic stenosis is present.   6. The inferior vena cava is normal in size with greater  than 50%  respiratory variability, suggesting right atrial pressure of 3 mmHg.   Conclusion(s)/Recommendation(s): Accoustic windows poor in the prior  study. EF has improved compared to 10/08/2020.   Laboratory Data:  High Sensitivity Troponin:   Recent Labs  Lab 04/10/21 1201 04/10/21 1343  TROPONINIHS 19* 20*     Chemistry Recent Labs  Lab 04/10/21 1201  NA 139  K 4.2  CL 104  CO2 26  GLUCOSE 82  BUN 25*  CREATININE 1.59*  CALCIUM 9.1  GFRNONAA 44*  ANIONGAP 9    Hematology Recent Labs  Lab 04/10/21 1201  WBC 6.8  RBC 4.74  HGB 13.1  HCT 41.4  MCV 87.3  MCH 27.6  MCHC 31.6  RDW 15.1  PLT 244   BNP Recent Labs  Lab 04/10/21 1201  BNP 1,143.3*    Radiology/Studies:  DG Chest 2 View  Result Date: 04/10/2021 CLINICAL DATA:  SOB x 1 week with fatigue during exertion. EXAM: CHEST - 2 VIEW COMPARISON:  None. FINDINGS: Mild bilateral interstitial thickening. Trace right pleural effusion. No focal consolidation. No pleural effusion or pneumothorax. Stable cardiomegaly. No acute osseous abnormality. IMPRESSION: 1. Cardiomegaly with mild pulmonary vascular congestion. Electronically Signed   By: Elige Ko M.D.   On: 04/10/2021 10:36    Assessment and Plan:   Acute on chronic diastolic heart failure - Echo 6/96 LVEF 40-45% -  Echo 10/22 LVEF 50-55% and grade 1 DD -His shortness of breath seems multifactorial from obesity, deconditioning and heart failure -Evidence of volume overload by exam and lab -Give IV Lasix 40 mg x 1 -Patient wants to go home.  Will review plan with MD -Current heart failure exacerbation is due to excess sodium intake +/- with PVC burden -Continue Toprol-XL - Hold home losartan given bump in SCr - Continue home imdur and hydralazine ( (reported not taking it)  2.  PVC/nonsustained VT -Longstanding history.  Recent monitor with 11.3% burden. -Given underlying trifascicular block, unable to add amiodarone or uptitrate beta-blocker -Continue Toprol-XL 50 mg daily - Keep k > 4 and Mg > 2  3. AKI - Creatinine 1.59 (baseline Scr was 1.2-1.3 on 10/2020) - Follow closely with diuresis   4. HLD - No results found for requested labs within last 8760 hours.  - Continue home Crestor 20mg  qd (reported not taking it)   Patient will need diet and medications education prior to discharge.   Risk Assessment/Risk Scores:   New York Heart Association (NYHA) Functional Class NYHA Class II    For questions or updates, please contact [facility] Please consult [email or web address] for contact info under    Signed, Manson Passey, PA    Personally seen and examined. 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  Though his multiple LHCs he has not had chest pain chest pressure chest tightness of chest stinging.  At the end of the year he could walk to his truck and do mostly what he wanted.  Over the past two weeks he has had worsening SOB.  This  had progressed to DOE where he cannot get out to his truck.  He suspects weight gain.  No PND or orthopnea.  He notes his legs have been swollen for 78 years but they are worse than normal.    Seen in ED with BNP 1143 and given 40 IV lasix.  Feels better but not back to baseline.  Exam notable for Morbid obese male  decreased breath sounds in bases, large beard difficulty JVP, +2 bilateral edema   Labs notable for Creatinine 1.59 Personally reviewed relevant tests; ECG ST with LBBB and frequent PVCs in the pattern of bigeminy Tele- frequent PVCs and NSVT Would recommend  as above - will get echo - will diurese - adding back afterload reduction but holding ARB - if decreased LV function will get EP on board for discussion of more aggressive therapy for ectopy (discussed in brief today)  Riley Lam, MD Cardiologist [facility]  [address] [place], [address] [place], [place] [address] [phone]  6:12 PM     04/10/2021 3:15 PM

## 2021-04-10 NOTE — ED Triage Notes (Signed)
SOB x 1 week with fatigue during exertion. +edema denies chest pain.

## 2021-04-11 ENCOUNTER — Observation Stay (HOSPITAL_COMMUNITY): Payer: Medicare Other

## 2021-04-11 DIAGNOSIS — Z20822 Contact with and (suspected) exposure to covid-19: Secondary | ICD-10-CM | POA: Diagnosis present

## 2021-04-11 DIAGNOSIS — I5033 Acute on chronic diastolic (congestive) heart failure: Secondary | ICD-10-CM | POA: Diagnosis not present

## 2021-04-11 DIAGNOSIS — G4733 Obstructive sleep apnea (adult) (pediatric): Secondary | ICD-10-CM | POA: Diagnosis present

## 2021-04-11 DIAGNOSIS — I110 Hypertensive heart disease with heart failure: Secondary | ICD-10-CM | POA: Diagnosis present

## 2021-04-11 DIAGNOSIS — Z8249 Family history of ischemic heart disease and other diseases of the circulatory system: Secondary | ICD-10-CM | POA: Diagnosis not present

## 2021-04-11 DIAGNOSIS — Z79899 Other long term (current) drug therapy: Secondary | ICD-10-CM | POA: Diagnosis not present

## 2021-04-11 DIAGNOSIS — N179 Acute kidney failure, unspecified: Secondary | ICD-10-CM | POA: Diagnosis present

## 2021-04-11 DIAGNOSIS — I5043 Acute on chronic combined systolic (congestive) and diastolic (congestive) heart failure: Secondary | ICD-10-CM | POA: Diagnosis not present

## 2021-04-11 DIAGNOSIS — I472 Ventricular tachycardia, unspecified: Secondary | ICD-10-CM | POA: Diagnosis present

## 2021-04-11 DIAGNOSIS — E119 Type 2 diabetes mellitus without complications: Secondary | ICD-10-CM | POA: Diagnosis present

## 2021-04-11 DIAGNOSIS — Z833 Family history of diabetes mellitus: Secondary | ICD-10-CM | POA: Diagnosis not present

## 2021-04-11 DIAGNOSIS — Z7984 Long term (current) use of oral hypoglycemic drugs: Secondary | ICD-10-CM | POA: Diagnosis not present

## 2021-04-11 DIAGNOSIS — I2510 Atherosclerotic heart disease of native coronary artery without angina pectoris: Secondary | ICD-10-CM | POA: Diagnosis present

## 2021-04-11 DIAGNOSIS — I5021 Acute systolic (congestive) heart failure: Secondary | ICD-10-CM | POA: Diagnosis not present

## 2021-04-11 DIAGNOSIS — Z888 Allergy status to other drugs, medicaments and biological substances status: Secondary | ICD-10-CM | POA: Diagnosis not present

## 2021-04-11 DIAGNOSIS — Z7901 Long term (current) use of anticoagulants: Secondary | ICD-10-CM | POA: Diagnosis not present

## 2021-04-11 DIAGNOSIS — I498 Other specified cardiac arrhythmias: Secondary | ICD-10-CM | POA: Diagnosis present

## 2021-04-11 DIAGNOSIS — I5023 Acute on chronic systolic (congestive) heart failure: Secondary | ICD-10-CM | POA: Diagnosis present

## 2021-04-11 DIAGNOSIS — E785 Hyperlipidemia, unspecified: Secondary | ICD-10-CM | POA: Diagnosis present

## 2021-04-11 DIAGNOSIS — I428 Other cardiomyopathies: Secondary | ICD-10-CM | POA: Diagnosis present

## 2021-04-11 DIAGNOSIS — Z6841 Body Mass Index (BMI) 40.0 and over, adult: Secondary | ICD-10-CM | POA: Diagnosis not present

## 2021-04-11 DIAGNOSIS — I453 Trifascicular block: Secondary | ICD-10-CM | POA: Diagnosis present

## 2021-04-11 LAB — CBC
HCT: 42 % (ref 39.0–52.0)
Hemoglobin: 13.3 g/dL (ref 13.0–17.0)
MCH: 26.9 pg (ref 26.0–34.0)
MCHC: 31.7 g/dL (ref 30.0–36.0)
MCV: 85 fL (ref 80.0–100.0)
Platelets: 234 10*3/uL (ref 150–400)
RBC: 4.94 MIL/uL (ref 4.22–5.81)
RDW: 14.7 % (ref 11.5–15.5)
WBC: 8.4 10*3/uL (ref 4.0–10.5)
nRBC: 0 % (ref 0.0–0.2)

## 2021-04-11 LAB — LIPID PANEL
Cholesterol: 145 mg/dL (ref 0–200)
HDL: 36 mg/dL — ABNORMAL LOW (ref >40)
LDL Cholesterol: 92 mg/dL (ref 0–99)
Total CHOL/HDL Ratio: 4 RATIO
Triglycerides: 83 mg/dL (ref <150)
VLDL: 17 mg/dL (ref 0–40)

## 2021-04-11 LAB — BASIC METABOLIC PANEL
Anion gap: 10 (ref 5–15)
BUN: 25 mg/dL — ABNORMAL HIGH (ref 8–23)
CO2: 27 mmol/L (ref 22–32)
Calcium: 9.3 mg/dL (ref 8.9–10.3)
Chloride: 102 mmol/L (ref 98–111)
Creatinine, Ser: 1.52 mg/dL — ABNORMAL HIGH (ref 0.61–1.24)
GFR, Estimated: 46 mL/min — ABNORMAL LOW (ref >60)
Glucose, Bld: 125 mg/dL — ABNORMAL HIGH (ref 70–99)
Potassium: 3.7 mmol/L (ref 3.5–5.1)
Sodium: 139 mmol/L (ref 135–145)

## 2021-04-11 LAB — HEMOGLOBIN A1C
Hgb A1c MFr Bld: 6.5 % — ABNORMAL HIGH (ref 4.8–5.6)
Mean Plasma Glucose: 139.85 mg/dL

## 2021-04-11 LAB — ECHOCARDIOGRAM LIMITED
Height: 68 in
S' Lateral: 5 cm
Weight: 3933.01 oz

## 2021-04-11 MED ORDER — PERFLUTREN LIPID MICROSPHERE
1.0000 mL | INTRAVENOUS | Status: AC | PRN
  Administered 2021-04-11: 4 mL via INTRAVENOUS
  Filled 2021-04-11: qty 10

## 2021-04-11 MED ORDER — FUROSEMIDE 10 MG/ML IJ SOLN
40.0000 mg | Freq: Two times a day (BID) | INTRAMUSCULAR | Status: DC
  Administered 2021-04-11 – 2021-04-12 (×2): 40 mg via INTRAVENOUS
  Filled 2021-04-11 (×2): qty 4

## 2021-04-11 MED ORDER — LOSARTAN POTASSIUM 25 MG PO TABS
12.5000 mg | ORAL_TABLET | Freq: Every day | ORAL | Status: DC
  Administered 2021-04-11 – 2021-04-12 (×2): 12.5 mg via ORAL
  Filled 2021-04-11 (×2): qty 1

## 2021-04-11 NOTE — Progress Notes (Signed)
Echocardiogram 2D Echocardiogram has been performed.  Eduard Roux 04/11/2021, 8:47 AM

## 2021-04-11 NOTE — TOC Initial Note (Signed)
Transition of Care [facility]) - Initial/Assessment Note    Patient Details  Name: Karl Peters MRN: [id number] Date of Birth: 1940/05/03  Transition of Care [facility]) CM/SW Contact:    Beckie Busing, RN Phone Number:[phone]  04/11/2021, 1:08 PM  Clinical Narrative:                  Transition of Care [facility]) Screening Note   Patient Details  Name: Karl Peters Date of Birth: 04/24/1940   Transition of Care [facility]) CM/SW Contact:    Beckie Busing, RN Phone Number: 04/11/2021, 1:09 PM    Transition of Care Department [facility]) has reviewed patient and no TOC needs have been identified at this time. We will continue to monitor patient advancement through interdisciplinary progression rounds.           Patient Goals and CMS Choice        Expected Discharge Plan and Services                                                Prior Living Arrangements/Services                       Activities of Daily Living Home Assistive Devices/Equipment: Eyeglasses, Hearing aid ADL Screening (condition at time of admission) Patient's cognitive ability adequate to safely complete daily activities?: Yes Is the patient deaf or have difficulty hearing?: No Does the patient have difficulty seeing, even when wearing glasses/contacts?: No Does the patient have difficulty concentrating, remembering, or making decisions?: No Patient able to express need for assistance with ADLs?: Yes Does the patient have difficulty dressing or bathing?: No Independently performs ADLs?: Yes (appropriate for developmental age) Does the patient have difficulty walking or climbing stairs?: No Weakness of Legs: None Weakness of Arms/Hands: None  Permission Sought/Granted                  Emotional Assessment              Admission diagnosis:  Bigeminy [I49.8] Acute on chronic diastolic CHF (congestive heart failure), NYHA class 3 (HCC) [I50.33] Acute congestive heart failure,  unspecified heart failure type (HCC) [I50.9] Acute on chronic diastolic CHF (congestive heart failure) (HCC) [I50.33] Patient Active Problem List   Diagnosis Date Noted   Acute on chronic diastolic CHF (congestive heart failure) (HCC) 04/11/2021   Acute on chronic diastolic CHF (congestive heart failure), NYHA class 3 (HCC) 04/10/2021   Thrombophlebitis 10/29/2020   Bacteremia due to Gram-positive bacteria 10/29/2020   SOB (shortness of breath)    AKI (acute kidney injury) (HCC) 10/07/2020   Arrhythmia 10/07/2020   Hypotension 06/09/2012   Chest pressure 06/09/2012   DM2 (diabetes mellitus, type 2) (HCC) 05/24/2008   DEPRESSION 05/24/2008   Essential hypertension 05/24/2008   NSVT (nonsustained ventricular tachycardia) (HCC) 05/24/2008   PREMATURE VENTRICULAR CONTRACTIONS 05/24/2008   Syncope 05/24/2008   PCP:  Elizabeth Palau, FNP Pharmacy:   [facility] [phone] - [place], [place] - [address] [facility] [place] [address] Phone: [phone] Fax: [phone]     Social Determinants of Health (SDOH) Interventions    Readmission Risk Interventions No flowsheet data found.

## 2021-04-11 NOTE — Progress Notes (Signed)
Progress Note  Patient Name: Karl Peters Date of Encounter: 04/11/2021  Primary Cardiologist: None   Subjective   Echo wad done and pending upload; appears significant reduced in non contrast images.   Creatinine stable with diuresis. Feels like he has lost 21 lbs.  Provider note: he has run our of hearing aid battery and is notably HOH.  Inpatient Medications    Scheduled Meds:  aspirin EC  81 mg Oral Daily   buPROPion  300 mg Oral q morning   heparin  5,000 Units Subcutaneous Q8H   metoprolol succinate  50 mg Oral Daily   montelukast  10 mg Oral q morning   pantoprazole  40 mg Oral q morning   rosuvastatin  40 mg Oral q morning   Continuous Infusions:  PRN Meds: acetaminophen, nitroGLYCERIN, ondansetron (ZOFRAN) IV   Vital Signs    Vitals:   04/10/21 2126 04/10/21 2352 04/11/21 0330 04/11/21 0610  BP: (!) 180/93 137/67 (!) 142/97   Pulse: 82 86 66   Resp:  18 16   Temp:  98.2 F (36.8 C) 98.4 F (36.9 C)   TempSrc:  Oral Oral   SpO2:  93% 94%   Weight:    111.5 kg  Height:        Intake/Output Summary (Last 24 hours) at 04/11/2021 0804 Last data filed at 04/11/2021 [id number] Gross per 24 hour  Intake 370 ml  Output 4100 ml  Net -3730 ml   Filed Weights   04/10/21 1013 04/10/21 2009 04/11/21 0610  Weight: 120.7 kg 117 kg 111.5 kg    Telemetry    SR with NSVT and frequent PVCs - Personally Reviewed  Physical Exam   Gen: No distress, morbid obesity  Cardiac: No Rubs or Gallops, no Murmur, IRIR +2 radial pulses distant heart sounds Respiratory: Decreased breath sounds bilaterally, normal effort, normal  respiratory rate GI: Soft, nontender, non-distended  MS: +2 edema;  moves all extremities Integument: Skin feels warm Neuro:  At time of evaluation, alert and oriented to person/place/time/situation  Psych: Normal affect, patient feels well  Labs    Chemistry Recent Labs  Lab 04/10/21 1201 04/11/21 0116  NA 139 139  K 4.2 3.7  CL 104 102   CO2 26 27  GLUCOSE 82 125*  BUN 25* 25*  CREATININE 1.59* 1.52*  CALCIUM 9.1 9.3  GFRNONAA 44* 46*  ANIONGAP 9 10     Hematology Recent Labs  Lab 04/10/21 1201 04/11/21 0116  WBC 6.8 8.4  RBC 4.74 4.94  HGB 13.1 13.3  HCT 41.4 42.0  MCV 87.3 85.0  MCH 27.6 26.9  MCHC 31.6 31.7  RDW 15.1 14.7  PLT 244 234    Cardiac EnzymesNo results for input(s): TROPONINI in the last 168 hours. No results for input(s): TROPIPOC in the last 168 hours.   BNP Recent Labs  Lab 04/10/21 1201  BNP 1,143.3*     DDimer No results for input(s): DDIMER in the last 168 hours.   Radiology    DG Chest 2 View  Result Date: 04/10/2021 CLINICAL DATA:  SOB x 1 week with fatigue during exertion. EXAM: CHEST - 2 VIEW COMPARISON:  None. FINDINGS: Mild bilateral interstitial thickening. Trace right pleural effusion. No focal consolidation. No pleural effusion or pneumothorax. Stable cardiomegaly. No acute osseous abnormality. IMPRESSION: 1. Cardiomegaly with mild pulmonary vascular congestion. Electronically Signed   By: Kathreen Devoid M.D.   On: 04/10/2021 10:36     Patient Profile  81 y.o. 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 - Intolerant to SGLT2i (n/v) with 8/22 attempt - lasix 40 IV BID today  - there is a mixed picture of him being on sub optimal GDMT and  - metoprolol succinate 50 - EP has reviewed patients case and will make recommendations - ASA 81 mg PO daily - restarting losartan 12.5 mg PO daily; I would like to transition him to ARNI but he is hesitant and cite his prior issues with SGLT2i - continue rosuvastatin 40 mg PO daily  DVT PPX- heparin Labs ordered for tomorrow Full Code  For questions or updates, please contact [facility] Please consult [email or web address] for contact info  under Cardiology/STEMI.      Signed, Werner Lean, MD  04/11/2021, 8:04 AM

## 2021-04-11 NOTE — Consult Note (Addendum)
ELECTROPHYSIOLOGY CONSULT NOTE    Patient ID: Karl Peters MRN: [id number], DOB/AGE: 23-Apr-1940 81 y.o.  Admit date: 04/10/2021 Date of Consult: 04/11/2021  Primary Physician: Elizabeth Palau, FNP Primary Cardiologist: None  Electrophysiologist: Dr. Johney Frame  Referring Provider: Dr. Izora Ribas  Patient Profile: Karl Peters is a 81 y.o. male with a history of PVC, nonsustained VT, nonobstructive CAD, nonischemic cardiomyopathy, hypertension, hyperlipidemia, diabetes mellitus, obesity, sleep apnea (not using CPAP for long time) who is being seen 04/10/2021 for the evaluation of heart failure and ventricular ectopy at the request of who is being seen today for the evaluation of frequent PVCs at the request of Dr. Izora Ribas.  HPI:  Karl Peters is a 81 y.o. male with medical history as above.   Patient has a longstanding history of PVC and nonsustained VT dating back to 2010.  Previously on carvedilol which changed to Toprol.  Has underlying trifascicular block, limiting up titration of AV nodal blocking agent or addition of antiarrhythmic.  History of dehydration on Jardiance.   Echocardiogram August 2022 with low normal LV function at 40 to 45%.  Had a high risk stress test August 2022.  Underwent cardiac catheterization for further evaluation showing nonobstructive CAD.  Monitor 10/2020 with 11.3% burden of PVCs/nonsustained VT. Seen by EP APP, recommended continue current dose of beta-blocker.  Echocardiogram October 2022 showed improved LV function to 50 to 55% and grade 1 diastolic dysfunction.  Presented to [facility] yesterday with worsening fatigue and exertion x 2 weeks, but chronic over several months. + peripheral edema, and no real exercise.   Feeling a little better today. His hearing aid batteries have died.  He says his wife is scheduled for an AF ablation, but he cant remember with whom (here in town). Comfortable at rest currently and diuresing well.  He is open to  ablation consideration.   Past Medical History:  Diagnosis Date   Acute on chronic diastolic CHF (congestive heart failure), NYHA class 3 (HCC) 04/10/2021   Diabetes mellitus    Dyslipidemia    Hx of cardiac catheterization    a. normal cors by cath in 1998 and 2010   Hx of echocardiogram    Echocardiogram 06/10/12: Normal LV wall thickness, EF 50-55%, grade 1 diastolic dysfunction, mild LAE   Hypertension    Obesity    Ventricular tachycardia    and symptomatic PVCs - Rx with beta blocker     Surgical History:  Past Surgical History:  Procedure Laterality Date   ESOPHAGOGASTRODUODENOSCOPY  06/18/2003   negative   LEFT HEART CATH  12/27/2015   Procedure: Left Heart Catheterization W Intervention; Surgeon: Arizona Constable, MD; Location: [facility] CATH; Service: Cardiology     LEFT HEART CATH AND CORONARY ANGIOGRAPHY N/A 10/10/2020   Procedure: LEFT HEART CATH AND CORONARY ANGIOGRAPHY;  Surgeon: Lyn Records, MD;  Location: [facility] INVASIVE CV LAB;  Service: Cardiovascular;  Laterality: N/A;     Medications Prior to Admission  Medication Sig Dispense Refill Last Dose   buPROPion (WELLBUTRIN XL) 300 MG 24 hr tablet Take 300 mg by mouth every morning.   04/09/2021   glimepiride (AMARYL) 4 MG tablet Take 4 mg by mouth every morning.   04/09/2021   losartan (COZAAR) 25 MG tablet Take 0.5 tablets (12.5 mg total) by mouth at bedtime. 45 tablet 3 04/09/2021   metFORMIN (GLUCOPHAGE) 500 MG tablet Take 1,000 mg by mouth every morning.   04/09/2021   metoprolol succinate (TOPROL-XL) 50 MG 24 hr tablet  Vitals:   04/10/21 2126 04/10/21 2352 04/11/21 0330 04/11/21 0610  BP: (!) 180/93 137/67 (!) 142/97   Pulse: 82 86 66   Resp:  18 16   Temp:  98.2 F (36.8 C) 98.4 F (36.9 C)   TempSrc:  Oral Oral   SpO2:  93% 94%   Weight:    111.5 kg  Height:        GEN- The patient is well appearing, alert and oriented x 3 today.   HEENT: normocephalic, atraumatic; sclera clear, conjunctiva pink; hearing intact; oropharynx clear; neck supple Lungs- Clear to ausculation bilaterally, normal work of breathing.  No wheezes, rales, rhonchi Heart- Regular rate and rhythm, no murmurs, rubs or gallops GI- soft, non-tender, non-distended, bowel sounds present Extremities- no clubbing, cyanosis, or edema; DP/PT/radial pulses 2+ bilaterally MS- no significant deformity or atrophy Skin- warm and dry, no rash or lesion Psych- euthymic mood, full affect Neuro- strength and sensation are intact  Labs:   Lab Results  Component Value Date   WBC 8.4 04/11/2021   HGB 13.3 04/11/2021   HCT 42.0 04/11/2021   MCV 85.0 04/11/2021   PLT 234 04/11/2021    Recent Labs  Lab 04/11/21 0116  NA 139  K 3.7  CL 102  CO2 27  BUN 25*  CREATININE 1.52*  CALCIUM 9.3  GLUCOSE 125*      Radiology/Studies: DG Chest 2 View  Result Date: 04/10/2021 CLINICAL DATA:  SOB x 1 week with fatigue during exertion. EXAM:  CHEST - 2 VIEW COMPARISON:  None. FINDINGS: Mild bilateral interstitial thickening. Trace right pleural effusion. No focal consolidation. No pleural effusion or pneumothorax. Stable cardiomegaly. No acute osseous abnormality. IMPRESSION: 1. Cardiomegaly with mild pulmonary vascular congestion. Electronically Signed   By: Elige Ko M.D.   On: 04/10/2021 10:36   ECHOCARDIOGRAM LIMITED  Result Date: 04/11/2021    ECHOCARDIOGRAM LIMITED REPORT   Patient Name:   Karl Peters Date of Exam: 04/11/2021 Medical Rec #:  [id number]     Height:       68.0 in Accession #:    [id number]    Weight:       245.8 lb Date of Birth:  11-18-1940    BSA:          2.231 m Patient Age:    80 years      BP:           142/97 mmHg Patient Gender: M             HR:           35 bpm. Exam Location:  Inpatient Procedure: 2D Echo and Intracardiac Opacification Agent Indications:    Congestive heart failure  History:        Patient has prior history of Echocardiogram examinations, most                 recent 12/10/2020. CHF; Risk Factors:Hypertension and Diabetes.  Sonographer:    Eduard Roux Referring Phys: [id number] Sharrell Ku BHAGAT IMPRESSIONS  1. Global hypokinesis worse in the inferoseptal, inferior and inferolateral myocarium. Left ventricular ejection fraction, by estimation, is 25 to 30%. The left ventricle has severely decreased function. The left ventricle demonstrates global hypokinesis. There is mild concentric left ventricular hypertrophy.  2. Right ventricular systolic function is normal. The right ventricular size is normal.  3. Left atrial size was moderately dilated.  4. The mitral valve is normal in structure. No evidence of mitral valve regurgitation. No evidence of  Vitals:   04/10/21 2126 04/10/21 2352 04/11/21 0330 04/11/21 0610  BP: (!) 180/93 137/67 (!) 142/97   Pulse: 82 86 66   Resp:  18 16   Temp:  98.2 F (36.8 C) 98.4 F (36.9 C)   TempSrc:  Oral Oral   SpO2:  93% 94%   Weight:    111.5 kg  Height:        GEN- The patient is well appearing, alert and oriented x 3 today.   HEENT: normocephalic, atraumatic; sclera clear, conjunctiva pink; hearing intact; oropharynx clear; neck supple Lungs- Clear to ausculation bilaterally, normal work of breathing.  No wheezes, rales, rhonchi Heart- Regular rate and rhythm, no murmurs, rubs or gallops GI- soft, non-tender, non-distended, bowel sounds present Extremities- no clubbing, cyanosis, or edema; DP/PT/radial pulses 2+ bilaterally MS- no significant deformity or atrophy Skin- warm and dry, no rash or lesion Psych- euthymic mood, full affect Neuro- strength and sensation are intact  Labs:   Lab Results  Component Value Date   WBC 8.4 04/11/2021   HGB 13.3 04/11/2021   HCT 42.0 04/11/2021   MCV 85.0 04/11/2021   PLT 234 04/11/2021    Recent Labs  Lab 04/11/21 0116  NA 139  K 3.7  CL 102  CO2 27  BUN 25*  CREATININE 1.52*  CALCIUM 9.3  GLUCOSE 125*      Radiology/Studies: DG Chest 2 View  Result Date: 04/10/2021 CLINICAL DATA:  SOB x 1 week with fatigue during exertion. EXAM:  CHEST - 2 VIEW COMPARISON:  None. FINDINGS: Mild bilateral interstitial thickening. Trace right pleural effusion. No focal consolidation. No pleural effusion or pneumothorax. Stable cardiomegaly. No acute osseous abnormality. IMPRESSION: 1. Cardiomegaly with mild pulmonary vascular congestion. Electronically Signed   By: Elige Ko M.D.   On: 04/10/2021 10:36   ECHOCARDIOGRAM LIMITED  Result Date: 04/11/2021    ECHOCARDIOGRAM LIMITED REPORT   Patient Name:   Karl Peters Date of Exam: 04/11/2021 Medical Rec #:  [id number]     Height:       68.0 in Accession #:    [id number]    Weight:       245.8 lb Date of Birth:  11-18-1940    BSA:          2.231 m Patient Age:    80 years      BP:           142/97 mmHg Patient Gender: M             HR:           35 bpm. Exam Location:  Inpatient Procedure: 2D Echo and Intracardiac Opacification Agent Indications:    Congestive heart failure  History:        Patient has prior history of Echocardiogram examinations, most                 recent 12/10/2020. CHF; Risk Factors:Hypertension and Diabetes.  Sonographer:    Eduard Roux Referring Phys: [id number] Sharrell Ku BHAGAT IMPRESSIONS  1. Global hypokinesis worse in the inferoseptal, inferior and inferolateral myocarium. Left ventricular ejection fraction, by estimation, is 25 to 30%. The left ventricle has severely decreased function. The left ventricle demonstrates global hypokinesis. There is mild concentric left ventricular hypertrophy.  2. Right ventricular systolic function is normal. The right ventricular size is normal.  3. Left atrial size was moderately dilated.  4. The mitral valve is normal in structure. No evidence of mitral valve regurgitation. No evidence of  ELECTROPHYSIOLOGY CONSULT NOTE    Patient ID: Karl Peters MRN: [id number], DOB/AGE: 23-Apr-1940 81 y.o.  Admit date: 04/10/2021 Date of Consult: 04/11/2021  Primary Physician: Elizabeth Palau, FNP Primary Cardiologist: None  Electrophysiologist: Dr. Johney Frame  Referring Provider: Dr. Izora Ribas  Patient Profile: Karl Peters is a 81 y.o. male with a history of PVC, nonsustained VT, nonobstructive CAD, nonischemic cardiomyopathy, hypertension, hyperlipidemia, diabetes mellitus, obesity, sleep apnea (not using CPAP for long time) who is being seen 04/10/2021 for the evaluation of heart failure and ventricular ectopy at the request of who is being seen today for the evaluation of frequent PVCs at the request of Dr. Izora Ribas.  HPI:  Karl Peters is a 81 y.o. male with medical history as above.   Patient has a longstanding history of PVC and nonsustained VT dating back to 2010.  Previously on carvedilol which changed to Toprol.  Has underlying trifascicular block, limiting up titration of AV nodal blocking agent or addition of antiarrhythmic.  History of dehydration on Jardiance.   Echocardiogram August 2022 with low normal LV function at 40 to 45%.  Had a high risk stress test August 2022.  Underwent cardiac catheterization for further evaluation showing nonobstructive CAD.  Monitor 10/2020 with 11.3% burden of PVCs/nonsustained VT. Seen by EP APP, recommended continue current dose of beta-blocker.  Echocardiogram October 2022 showed improved LV function to 50 to 55% and grade 1 diastolic dysfunction.  Presented to [facility] yesterday with worsening fatigue and exertion x 2 weeks, but chronic over several months. + peripheral edema, and no real exercise.   Feeling a little better today. His hearing aid batteries have died.  He says his wife is scheduled for an AF ablation, but he cant remember with whom (here in town). Comfortable at rest currently and diuresing well.  He is open to  ablation consideration.   Past Medical History:  Diagnosis Date   Acute on chronic diastolic CHF (congestive heart failure), NYHA class 3 (HCC) 04/10/2021   Diabetes mellitus    Dyslipidemia    Hx of cardiac catheterization    a. normal cors by cath in 1998 and 2010   Hx of echocardiogram    Echocardiogram 06/10/12: Normal LV wall thickness, EF 50-55%, grade 1 diastolic dysfunction, mild LAE   Hypertension    Obesity    Ventricular tachycardia    and symptomatic PVCs - Rx with beta blocker     Surgical History:  Past Surgical History:  Procedure Laterality Date   ESOPHAGOGASTRODUODENOSCOPY  06/18/2003   negative   LEFT HEART CATH  12/27/2015   Procedure: Left Heart Catheterization W Intervention; Surgeon: Arizona Constable, MD; Location: [facility] CATH; Service: Cardiology     LEFT HEART CATH AND CORONARY ANGIOGRAPHY N/A 10/10/2020   Procedure: LEFT HEART CATH AND CORONARY ANGIOGRAPHY;  Surgeon: Lyn Records, MD;  Location: [facility] INVASIVE CV LAB;  Service: Cardiovascular;  Laterality: N/A;     Medications Prior to Admission  Medication Sig Dispense Refill Last Dose   buPROPion (WELLBUTRIN XL) 300 MG 24 hr tablet Take 300 mg by mouth every morning.   04/09/2021   glimepiride (AMARYL) 4 MG tablet Take 4 mg by mouth every morning.   04/09/2021   losartan (COZAAR) 25 MG tablet Take 0.5 tablets (12.5 mg total) by mouth at bedtime. 45 tablet 3 04/09/2021   metFORMIN (GLUCOPHAGE) 500 MG tablet Take 1,000 mg by mouth every morning.   04/09/2021   metoprolol succinate (TOPROL-XL) 50 MG 24 hr tablet  Vitals:   04/10/21 2126 04/10/21 2352 04/11/21 0330 04/11/21 0610  BP: (!) 180/93 137/67 (!) 142/97   Pulse: 82 86 66   Resp:  18 16   Temp:  98.2 F (36.8 C) 98.4 F (36.9 C)   TempSrc:  Oral Oral   SpO2:  93% 94%   Weight:    111.5 kg  Height:        GEN- The patient is well appearing, alert and oriented x 3 today.   HEENT: normocephalic, atraumatic; sclera clear, conjunctiva pink; hearing intact; oropharynx clear; neck supple Lungs- Clear to ausculation bilaterally, normal work of breathing.  No wheezes, rales, rhonchi Heart- Regular rate and rhythm, no murmurs, rubs or gallops GI- soft, non-tender, non-distended, bowel sounds present Extremities- no clubbing, cyanosis, or edema; DP/PT/radial pulses 2+ bilaterally MS- no significant deformity or atrophy Skin- warm and dry, no rash or lesion Psych- euthymic mood, full affect Neuro- strength and sensation are intact  Labs:   Lab Results  Component Value Date   WBC 8.4 04/11/2021   HGB 13.3 04/11/2021   HCT 42.0 04/11/2021   MCV 85.0 04/11/2021   PLT 234 04/11/2021    Recent Labs  Lab 04/11/21 0116  NA 139  K 3.7  CL 102  CO2 27  BUN 25*  CREATININE 1.52*  CALCIUM 9.3  GLUCOSE 125*      Radiology/Studies: DG Chest 2 View  Result Date: 04/10/2021 CLINICAL DATA:  SOB x 1 week with fatigue during exertion. EXAM:  CHEST - 2 VIEW COMPARISON:  None. FINDINGS: Mild bilateral interstitial thickening. Trace right pleural effusion. No focal consolidation. No pleural effusion or pneumothorax. Stable cardiomegaly. No acute osseous abnormality. IMPRESSION: 1. Cardiomegaly with mild pulmonary vascular congestion. Electronically Signed   By: Elige Ko M.D.   On: 04/10/2021 10:36   ECHOCARDIOGRAM LIMITED  Result Date: 04/11/2021    ECHOCARDIOGRAM LIMITED REPORT   Patient Name:   Karl Peters Date of Exam: 04/11/2021 Medical Rec #:  [id number]     Height:       68.0 in Accession #:    [id number]    Weight:       245.8 lb Date of Birth:  11-18-1940    BSA:          2.231 m Patient Age:    80 years      BP:           142/97 mmHg Patient Gender: M             HR:           35 bpm. Exam Location:  Inpatient Procedure: 2D Echo and Intracardiac Opacification Agent Indications:    Congestive heart failure  History:        Patient has prior history of Echocardiogram examinations, most                 recent 12/10/2020. CHF; Risk Factors:Hypertension and Diabetes.  Sonographer:    Eduard Roux Referring Phys: [id number] Sharrell Ku BHAGAT IMPRESSIONS  1. Global hypokinesis worse in the inferoseptal, inferior and inferolateral myocarium. Left ventricular ejection fraction, by estimation, is 25 to 30%. The left ventricle has severely decreased function. The left ventricle demonstrates global hypokinesis. There is mild concentric left ventricular hypertrophy.  2. Right ventricular systolic function is normal. The right ventricular size is normal.  3. Left atrial size was moderately dilated.  4. The mitral valve is normal in structure. No evidence of mitral valve regurgitation. No evidence of

## 2021-04-11 NOTE — Plan of Care (Signed)

## 2021-04-11 NOTE — Progress Notes (Incomplete)
{  Select Note:[id number]} 

## 2021-04-11 NOTE — Progress Notes (Signed)
Mobility Specialist Criteria Algorithm Info. ° ° 04/11/21 1030  °Mobility  °Activity Ambulated with assistance in hallway  °Range of Motion/Exercises Active;All extremities  °Level of Assistance Independent  °Assistive Device None  °Distance Ambulated (ft) 400 ft  °Activity Response Tolerated well  ° °Patient received in bed eager to participate in mobility. Ambulated in hallway independently with steady gait. Oxygen saturated well on RA, saturating 96% throughout. Returned to room without complaint or incident. Was left lying supine in bed with all needs met, call bell in reach. ° ° °04/11/2021 °3:47 PM ° °Jordan Tripp, CMS, BS EXP °Acute Rehabilitation Services  °Phone:[phone] °Office: [phone] ° °

## 2021-04-12 DIAGNOSIS — I428 Other cardiomyopathies: Secondary | ICD-10-CM

## 2021-04-12 DIAGNOSIS — I5033 Acute on chronic diastolic (congestive) heart failure: Secondary | ICD-10-CM

## 2021-04-12 DIAGNOSIS — I2510 Atherosclerotic heart disease of native coronary artery without angina pectoris: Secondary | ICD-10-CM

## 2021-04-12 DIAGNOSIS — I5023 Acute on chronic systolic (congestive) heart failure: Secondary | ICD-10-CM

## 2021-04-12 DIAGNOSIS — I5021 Acute systolic (congestive) heart failure: Secondary | ICD-10-CM

## 2021-04-12 DIAGNOSIS — E785 Hyperlipidemia, unspecified: Secondary | ICD-10-CM

## 2021-04-12 LAB — BASIC METABOLIC PANEL
Anion gap: 9 (ref 5–15)
BUN: 29 mg/dL — ABNORMAL HIGH (ref 8–23)
CO2: 28 mmol/L (ref 22–32)
Calcium: 8.9 mg/dL (ref 8.9–10.3)
Chloride: 101 mmol/L (ref 98–111)
Creatinine, Ser: 1.7 mg/dL — ABNORMAL HIGH (ref 0.61–1.24)
GFR, Estimated: 41 mL/min — ABNORMAL LOW (ref >60)
Glucose, Bld: 124 mg/dL — ABNORMAL HIGH (ref 70–99)
Potassium: 3.7 mmol/L (ref 3.5–5.1)
Sodium: 138 mmol/L (ref 135–145)

## 2021-04-12 LAB — MAGNESIUM: Magnesium: 1.6 mg/dL — ABNORMAL LOW (ref 1.7–2.4)

## 2021-04-12 MED ORDER — SPIRONOLACTONE 25 MG PO TABS: 12.5000 mg | ORAL_TABLET | Freq: Every day | ORAL | 2 refills | Status: DC

## 2021-04-12 MED ORDER — SPIRONOLACTONE 12.5 MG HALF TABLET
12.5000 mg | ORAL_TABLET | Freq: Every day | ORAL | Status: DC
  Administered 2021-04-12: 12.5 mg via ORAL
  Filled 2021-04-12: qty 1

## 2021-04-12 MED ORDER — FUROSEMIDE 40 MG PO TABS: 40.0000 mg | ORAL_TABLET | Freq: Two times a day (BID) | ORAL | 2 refills | Status: DC

## 2021-04-12 MED ORDER — POTASSIUM CHLORIDE CRYS ER 20 MEQ PO TBCR: 40.0000 meq | EXTENDED_RELEASE_TABLET | Freq: Every day | ORAL | Status: DC

## 2021-04-12 MED ORDER — POTASSIUM CHLORIDE CRYS ER 20 MEQ PO TBCR: 40.0000 meq | EXTENDED_RELEASE_TABLET | Freq: Every day | ORAL | 2 refills | Status: DC

## 2021-04-12 MED ORDER — ASPIRIN 81 MG PO TBEC: 81.0000 mg | DELAYED_RELEASE_TABLET | Freq: Every day | ORAL | 11 refills | Status: DC

## 2021-04-12 MED ORDER — POTASSIUM CHLORIDE CRYS ER 20 MEQ PO TBCR
40.0000 meq | EXTENDED_RELEASE_TABLET | Freq: Once | ORAL | Status: AC
  Administered 2021-04-12: 40 meq via ORAL
  Filled 2021-04-12: qty 2

## 2021-04-12 MED ORDER — FUROSEMIDE 40 MG PO TABS: 40.0000 mg | ORAL_TABLET | Freq: Two times a day (BID) | ORAL | Status: DC

## 2021-04-12 NOTE — Plan of Care (Signed)

## 2021-04-12 NOTE — Discharge Instructions (Signed)
Heart Failure Education: Weigh yourself EVERY morning after you go to the bathroom but before you eat or drink anything. Write this number down in a weight log/diary. If you gain 3 pounds overnight or 5 pounds in a week, call the office. Take your medicines as prescribed. If you have concerns about your medications, please call us before you stop taking them.  Eat low salt foods--Limit salt (sodium) to 2000 mg per day. This will help prevent your body from holding onto fluid. Read food labels as many processed foods have a lot of sodium, especially canned goods and prepackaged meats. If you would like some assistance choosing low sodium foods, we would be happy to set you up with a nutritionist. Limit all fluids for the day to less than 2 liters (64 ounces). Fluid includes all drinks, coffee, juice, ice chips, soup, jello, and all other liquids. Stay as active as you can everyday. Staying active will give you more energy and make your muscles stronger. Start with 5 minutes at a time and work your way up to 30 minutes a day. Break up your activities--do some in the morning and some in the afternoon. Start with 3 days per week and work your way up to 5 days as you can.  If you have chest pain, feel short of breath, dizzy, or lightheaded, STOP. If you don't feel better after a short rest, call 911. If you do feel better, call the office to let us know you have symptoms with exercise.  

## 2021-04-12 NOTE — Discharge Summary (Addendum)
Discharge Summary    Patient ID: Karl Peters MRN: [id number]; DOB: 1940/04/27  Admit date: 04/10/2021 Discharge date: 04/12/2021  PCP:  Vicenta Aly, [place] Providers Cardiologist:  None  Electrophysiologist:  Thompson Grayer, MD  {  Discharge Diagnoses    Principal Problem:   Acute on chronic systolic CHF (congestive heart failure) ([place]) Active Problems:   DM2 (diabetes mellitus, type 2) ([place])   Essential hypertension   NSVT (nonsustained ventricular tachycardia) ([place])   Frequent PVCs   Non-ischemic cardiomyopathy ([place])   Non-obstructive CAD   Hyperlipidemia    Diagnostic Studies/Procedures    Echocardiogram 04/11/2021: Impressions:  1. Global hypokinesis worse in the inferoseptal, inferior and  inferolateral myocarium. Left ventricular ejection fraction, by  estimation, is 25 to 30%. The left ventricle has severely decreased  function. The left ventricle demonstrates global  hypokinesis. There is mild concentric left ventricular hypertrophy.   2. Right ventricular systolic function is normal. The right ventricular  size is normal.   3. Left atrial size was moderately dilated.   4. The mitral valve is normal in structure. No evidence of mitral valve  regurgitation. No evidence of mitral stenosis. Moderate mitral annular  calcification.   5. The aortic valve is tricuspid. Aortic valve regurgitation is not  visualized. No aortic stenosis is present.   6. Aortic dilatation noted. There is borderline dilatation of the  ascending aorta, measuring 36 mm.   7. The inferior vena cava is dilated in size with <50% respiratory  variability, suggesting right atrial pressure of 15 mmHg.  _____________   History of Present Illness     Karl Peters is a 81 y.o. 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  Patient reported noted feeling well for the past few months with fatigue and exertion shortness of breath. Symptoms worsened over the last 2 weeks. He also reported lower extremity edema. No orthopnea (reported sleeping on 1 pillow). No chest pain. No regular exercise. He has a history of sleep apnea and was reported on CPAP many years ago but stopped for unknown reasons. He denied adding extra salt but did describe a high sodium diet. No regular weight check. Given worsening shortness of breath, patient presented to the ED for further evaluation.  In the ED, high-sensitivity troponin minimally elevated and flat at 19 >> 20 consistent with demand ischemia. BNP elevated at 1,143. Chest x-ray showed cardiomegaly and pulmonary edema. CBC normal. Na 139, K 4.2, Glucose 82, BUN 25, Cr 1.59. Patient was treated with IV Lasix and admitted.  Hospital Course     Consultants: Electrophysiology  Acute on Chronic Systolic CHF BNP elevated 1,143. Echo this admission showed LVEF of 25-30% with global hypokinesis and mild LVH. EF down from 50-55% on Echo in 11/2020. Patient was diuresed with IV Lasix and GDMT was adjusted. Net negative 5.165 L this admission. Discharge weight 244 lbs. Will be discharged on PO Lasix 40mg  twice daily (and potassium chloride 69mEq daily with this), Losartan 12.5mg  daily, Toprol-XL 50mg  daily, and Spironolactone 12.5mg  daily (new this admission). If BP and renal function allow, would  Chest 2 View  Result Date: 04/10/2021 CLINICAL DATA:  SOB x 1 week with fatigue during exertion. EXAM: CHEST - 2 VIEW COMPARISON:  None. FINDINGS: Mild bilateral interstitial thickening. Trace right pleural effusion. No focal consolidation. No pleural effusion or pneumothorax. Stable cardiomegaly. No acute osseous abnormality. IMPRESSION: 1. Cardiomegaly with mild pulmonary vascular congestion. Electronically Signed   By: Kathreen Devoid M.D.   On: 04/10/2021 10:36   ECHOCARDIOGRAM LIMITED  Result Date: 04/11/2021    ECHOCARDIOGRAM LIMITED REPORT   Patient Name:   Karl Peters Date of Exam: 04/11/2021 Medical Rec #:  [id number]     Height:       68.0 in Accession #:    [id number]    Weight:        245.8 lb Date of Birth:  1941-02-06    BSA:          2.231 m Patient Age:    16 years      BP:           142/97 mmHg Patient Gender: M             HR:           35 bpm. Exam Location:  Inpatient Procedure: 2D Echo and Intracardiac Opacification Agent Indications:    Congestive heart failure  History:        Patient has prior history of Echocardiogram examinations, most                 recent 12/10/2020. CHF; Risk Factors:Hypertension and Diabetes.  Sonographer:    Jefferey Pica Referring Phys: [id number] Crista Luria BHAGAT IMPRESSIONS  1. Global hypokinesis worse in the inferoseptal, inferior and inferolateral myocarium. Left ventricular ejection fraction, by estimation, is 25 to 30%. The left ventricle has severely decreased function. The left ventricle demonstrates global hypokinesis. There is mild concentric left ventricular hypertrophy.  2. Right ventricular systolic function is normal. The right ventricular size is normal.  3. Left atrial size was moderately dilated.  4. The mitral valve is normal in structure. No evidence of mitral valve regurgitation. No evidence of mitral stenosis. Moderate mitral annular calcification.  5. The aortic valve is tricuspid. Aortic valve regurgitation is not visualized. No aortic stenosis is present.  6. Aortic dilatation noted. There is borderline dilatation of the ascending aorta, measuring 36 mm.  7. The inferior vena cava is dilated in size with <50% respiratory variability, suggesting right atrial pressure of 15 mmHg. FINDINGS  Left Ventricle: Global hypokinesis worse in the inferoseptal, inferior and inferolateral myocarium. Left ventricular ejection fraction, by estimation, is 25 to 30%. The left ventricle has severely decreased function. The left ventricle demonstrates global hypokinesis. The left ventricular internal cavity size was normal in size. There is mild concentric left ventricular hypertrophy. Right Ventricle: The right ventricular size is normal. No  increase in right ventricular wall thickness. Right ventricular systolic function is normal. Left Atrium: Left atrial size was moderately dilated. Right Atrium: Right atrial size was normal in size. Pericardium: There is no evidence of pericardial effusion. Mitral Valve: The mitral valve is normal in structure. Moderate mitral annular calcification. No evidence of mitral valve stenosis. Tricuspid Valve: The tricuspid valve is normal in structure. Tricuspid valve regurgitation is not demonstrated. No evidence of tricuspid stenosis. Aortic Valve: The aortic valve is tricuspid. Aortic valve regurgitation is not visualized. No aortic stenosis is present. Pulmonic Valve: The pulmonic valve was normal in structure. Pulmonic valve regurgitation is not visualized. No evidence of pulmonic stenosis.  Discharge Summary    Patient ID: Karl Peters MRN: [id number]; DOB: 1940/04/27  Admit date: 04/10/2021 Discharge date: 04/12/2021  PCP:  Vicenta Aly, [place] Providers Cardiologist:  None  Electrophysiologist:  Thompson Grayer, MD  {  Discharge Diagnoses    Principal Problem:   Acute on chronic systolic CHF (congestive heart failure) ([place]) Active Problems:   DM2 (diabetes mellitus, type 2) ([place])   Essential hypertension   NSVT (nonsustained ventricular tachycardia) ([place])   Frequent PVCs   Non-ischemic cardiomyopathy ([place])   Non-obstructive CAD   Hyperlipidemia    Diagnostic Studies/Procedures    Echocardiogram 04/11/2021: Impressions:  1. Global hypokinesis worse in the inferoseptal, inferior and  inferolateral myocarium. Left ventricular ejection fraction, by  estimation, is 25 to 30%. The left ventricle has severely decreased  function. The left ventricle demonstrates global  hypokinesis. There is mild concentric left ventricular hypertrophy.   2. Right ventricular systolic function is normal. The right ventricular  size is normal.   3. Left atrial size was moderately dilated.   4. The mitral valve is normal in structure. No evidence of mitral valve  regurgitation. No evidence of mitral stenosis. Moderate mitral annular  calcification.   5. The aortic valve is tricuspid. Aortic valve regurgitation is not  visualized. No aortic stenosis is present.   6. Aortic dilatation noted. There is borderline dilatation of the  ascending aorta, measuring 36 mm.   7. The inferior vena cava is dilated in size with <50% respiratory  variability, suggesting right atrial pressure of 15 mmHg.  _____________   History of Present Illness     Karl Peters is a 81 y.o. 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  Patient reported noted feeling well for the past few months with fatigue and exertion shortness of breath. Symptoms worsened over the last 2 weeks. He also reported lower extremity edema. No orthopnea (reported sleeping on 1 pillow). No chest pain. No regular exercise. He has a history of sleep apnea and was reported on CPAP many years ago but stopped for unknown reasons. He denied adding extra salt but did describe a high sodium diet. No regular weight check. Given worsening shortness of breath, patient presented to the ED for further evaluation.  In the ED, high-sensitivity troponin minimally elevated and flat at 19 >> 20 consistent with demand ischemia. BNP elevated at 1,143. Chest x-ray showed cardiomegaly and pulmonary edema. CBC normal. Na 139, K 4.2, Glucose 82, BUN 25, Cr 1.59. Patient was treated with IV Lasix and admitted.  Hospital Course     Consultants: Electrophysiology  Acute on Chronic Systolic CHF BNP elevated 1,143. Echo this admission showed LVEF of 25-30% with global hypokinesis and mild LVH. EF down from 50-55% on Echo in 11/2020. Patient was diuresed with IV Lasix and GDMT was adjusted. Net negative 5.165 L this admission. Discharge weight 244 lbs. Will be discharged on PO Lasix 40mg  twice daily (and potassium chloride 69mEq daily with this), Losartan 12.5mg  daily, Toprol-XL 50mg  daily, and Spironolactone 12.5mg  daily (new this admission). If BP and renal function allow, would  Discharge Summary    Patient ID: Karl Peters MRN: [id number]; DOB: 1940/04/27  Admit date: 04/10/2021 Discharge date: 04/12/2021  PCP:  Vicenta Aly, [place] Providers Cardiologist:  None  Electrophysiologist:  Thompson Grayer, MD  {  Discharge Diagnoses    Principal Problem:   Acute on chronic systolic CHF (congestive heart failure) ([place]) Active Problems:   DM2 (diabetes mellitus, type 2) ([place])   Essential hypertension   NSVT (nonsustained ventricular tachycardia) ([place])   Frequent PVCs   Non-ischemic cardiomyopathy ([place])   Non-obstructive CAD   Hyperlipidemia    Diagnostic Studies/Procedures    Echocardiogram 04/11/2021: Impressions:  1. Global hypokinesis worse in the inferoseptal, inferior and  inferolateral myocarium. Left ventricular ejection fraction, by  estimation, is 25 to 30%. The left ventricle has severely decreased  function. The left ventricle demonstrates global  hypokinesis. There is mild concentric left ventricular hypertrophy.   2. Right ventricular systolic function is normal. The right ventricular  size is normal.   3. Left atrial size was moderately dilated.   4. The mitral valve is normal in structure. No evidence of mitral valve  regurgitation. No evidence of mitral stenosis. Moderate mitral annular  calcification.   5. The aortic valve is tricuspid. Aortic valve regurgitation is not  visualized. No aortic stenosis is present.   6. Aortic dilatation noted. There is borderline dilatation of the  ascending aorta, measuring 36 mm.   7. The inferior vena cava is dilated in size with <50% respiratory  variability, suggesting right atrial pressure of 15 mmHg.  _____________   History of Present Illness     Karl Peters is a 81 y.o. 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  Patient reported noted feeling well for the past few months with fatigue and exertion shortness of breath. Symptoms worsened over the last 2 weeks. He also reported lower extremity edema. No orthopnea (reported sleeping on 1 pillow). No chest pain. No regular exercise. He has a history of sleep apnea and was reported on CPAP many years ago but stopped for unknown reasons. He denied adding extra salt but did describe a high sodium diet. No regular weight check. Given worsening shortness of breath, patient presented to the ED for further evaluation.  In the ED, high-sensitivity troponin minimally elevated and flat at 19 >> 20 consistent with demand ischemia. BNP elevated at 1,143. Chest x-ray showed cardiomegaly and pulmonary edema. CBC normal. Na 139, K 4.2, Glucose 82, BUN 25, Cr 1.59. Patient was treated with IV Lasix and admitted.  Hospital Course     Consultants: Electrophysiology  Acute on Chronic Systolic CHF BNP elevated 1,143. Echo this admission showed LVEF of 25-30% with global hypokinesis and mild LVH. EF down from 50-55% on Echo in 11/2020. Patient was diuresed with IV Lasix and GDMT was adjusted. Net negative 5.165 L this admission. Discharge weight 244 lbs. Will be discharged on PO Lasix 40mg  twice daily (and potassium chloride 69mEq daily with this), Losartan 12.5mg  daily, Toprol-XL 50mg  daily, and Spironolactone 12.5mg  daily (new this admission). If BP and renal function allow, would  Chest 2 View  Result Date: 04/10/2021 CLINICAL DATA:  SOB x 1 week with fatigue during exertion. EXAM: CHEST - 2 VIEW COMPARISON:  None. FINDINGS: Mild bilateral interstitial thickening. Trace right pleural effusion. No focal consolidation. No pleural effusion or pneumothorax. Stable cardiomegaly. No acute osseous abnormality. IMPRESSION: 1. Cardiomegaly with mild pulmonary vascular congestion. Electronically Signed   By: Kathreen Devoid M.D.   On: 04/10/2021 10:36   ECHOCARDIOGRAM LIMITED  Result Date: 04/11/2021    ECHOCARDIOGRAM LIMITED REPORT   Patient Name:   Karl Peters Date of Exam: 04/11/2021 Medical Rec #:  [id number]     Height:       68.0 in Accession #:    [id number]    Weight:        245.8 lb Date of Birth:  1941-02-06    BSA:          2.231 m Patient Age:    16 years      BP:           142/97 mmHg Patient Gender: M             HR:           35 bpm. Exam Location:  Inpatient Procedure: 2D Echo and Intracardiac Opacification Agent Indications:    Congestive heart failure  History:        Patient has prior history of Echocardiogram examinations, most                 recent 12/10/2020. CHF; Risk Factors:Hypertension and Diabetes.  Sonographer:    Jefferey Pica Referring Phys: [id number] Crista Luria BHAGAT IMPRESSIONS  1. Global hypokinesis worse in the inferoseptal, inferior and inferolateral myocarium. Left ventricular ejection fraction, by estimation, is 25 to 30%. The left ventricle has severely decreased function. The left ventricle demonstrates global hypokinesis. There is mild concentric left ventricular hypertrophy.  2. Right ventricular systolic function is normal. The right ventricular size is normal.  3. Left atrial size was moderately dilated.  4. The mitral valve is normal in structure. No evidence of mitral valve regurgitation. No evidence of mitral stenosis. Moderate mitral annular calcification.  5. The aortic valve is tricuspid. Aortic valve regurgitation is not visualized. No aortic stenosis is present.  6. Aortic dilatation noted. There is borderline dilatation of the ascending aorta, measuring 36 mm.  7. The inferior vena cava is dilated in size with <50% respiratory variability, suggesting right atrial pressure of 15 mmHg. FINDINGS  Left Ventricle: Global hypokinesis worse in the inferoseptal, inferior and inferolateral myocarium. Left ventricular ejection fraction, by estimation, is 25 to 30%. The left ventricle has severely decreased function. The left ventricle demonstrates global hypokinesis. The left ventricular internal cavity size was normal in size. There is mild concentric left ventricular hypertrophy. Right Ventricle: The right ventricular size is normal. No  increase in right ventricular wall thickness. Right ventricular systolic function is normal. Left Atrium: Left atrial size was moderately dilated. Right Atrium: Right atrial size was normal in size. Pericardium: There is no evidence of pericardial effusion. Mitral Valve: The mitral valve is normal in structure. Moderate mitral annular calcification. No evidence of mitral valve stenosis. Tricuspid Valve: The tricuspid valve is normal in structure. Tricuspid valve regurgitation is not demonstrated. No evidence of tricuspid stenosis. Aortic Valve: The aortic valve is tricuspid. Aortic valve regurgitation is not visualized. No aortic stenosis is present. Pulmonic Valve: The pulmonic valve was normal in structure. Pulmonic valve regurgitation is not visualized. No evidence of pulmonic stenosis.

## 2021-04-12 NOTE — Progress Notes (Signed)
Progress Note  Patient Name: Karl Peters Date of Encounter: 04/12/2021  [facility] Cardiologist: Dr Izora Ribas  Subjective   No CP; dyspnea resolved  Inpatient Medications    Scheduled Meds:  aspirin EC  81 mg Oral Daily   buPROPion  300 mg Oral q morning   furosemide  40 mg Intravenous BID   heparin  5,000 Units Subcutaneous Q8H   losartan  12.5 mg Oral Daily   metoprolol succinate  50 mg Oral Daily   montelukast  10 mg Oral q morning   pantoprazole  40 mg Oral q morning   rosuvastatin  40 mg Oral q morning   Continuous Infusions:  PRN Meds: acetaminophen, nitroGLYCERIN, ondansetron (ZOFRAN) IV   Vital Signs    Vitals:   04/11/21 2347 04/12/21 0408 04/12/21 0625 04/12/21 0740  BP: 126/85 (!) 129/92  (!) 119/50  Pulse: 62 67  60  Resp: 15 16  16   Temp: 97.8 F (36.6 C) 97.6 F (36.4 C)  97.8 F (36.6 C)  TempSrc: Oral Oral  Oral  SpO2: 96% 95%  93%  Weight:   110.9 kg   Height:        Intake/Output Summary (Last 24 hours) at 04/12/2021 0839 Last data filed at 04/12/2021 0740 Gross per 24 hour  Intake 800 ml  Output 2100 ml  Net -1300 ml   Last 3 Weights 04/12/2021 04/11/2021 04/10/2021  Weight (lbs) 244 lb 7.8 oz 245 lb 13 oz 257 lb 15 oz  Weight (kg) 110.9 kg 111.5 kg 117 kg      Telemetry    Sinus rhythm with frequent PVCs and 3 beats nonsustained ventricular tachycardia- Personally Reviewed   Physical Exam   GEN: No acute distress.   Neck: No JVD Cardiac: RRR, no murmurs, rubs, or gallops.  Respiratory: Clear to auscultation bilaterally. GI: Soft, nontender, non-distended  MS: Trace edema Neuro:  Nonfocal  Psych: Normal affect   Labs    High Sensitivity Troponin:   Recent Labs  Lab 04/10/21 1201 04/10/21 1343  TROPONINIHS 19* 20*     Chemistry Recent Labs  Lab 04/10/21 1201 04/11/21 0116 04/12/21 0119  NA 139 139 138  K 4.2 3.7 3.7  CL 104 102 101  CO2 26 27 28   GLUCOSE 82 125* 124*  BUN 25* 25* 29*  CREATININE  1.59* 1.52* 1.70*  CALCIUM 9.1 9.3 8.9  MG  --   --  1.6*  GFRNONAA 44* 46* 41*  ANIONGAP 9 10 9     Lipids  Recent Labs  Lab 04/11/21 0116  CHOL 145  TRIG 83  HDL 36*  LDLCALC 92  CHOLHDL 4.0    Hematology Recent Labs  Lab 04/10/21 1201 04/11/21 0116  WBC 6.8 8.4  RBC 4.74 4.94  HGB 13.1 13.3  HCT 41.4 42.0  MCV 87.3 85.0  MCH 27.6 26.9  MCHC 31.6 31.7  RDW 15.1 14.7  PLT 244 234   Thyroid  Recent Labs  Lab 04/10/21 1201  TSH 2.037    BNP Recent Labs  Lab 04/10/21 1201  BNP 1,143.3*     Radiology    DG Chest 2 View  Result Date: 04/10/2021 CLINICAL DATA:  SOB x 1 week with fatigue during exertion. EXAM: CHEST - 2 VIEW COMPARISON:  None. FINDINGS: Mild bilateral interstitial thickening. Trace right pleural effusion. No focal consolidation. No pleural effusion or pneumothorax. Stable cardiomegaly. No acute osseous abnormality. IMPRESSION: 1. Cardiomegaly with mild pulmonary vascular congestion. Electronically Signed  By: Elige Ko M.D.   On: 04/10/2021 10:36   ECHOCARDIOGRAM LIMITED  Result Date: 04/11/2021    ECHOCARDIOGRAM LIMITED REPORT   Patient Name:   Karl Peters Date of Exam: 04/11/2021 Medical Rec #:  [id number]     Height:       68.0 in Accession #:    [id number]    Weight:       245.8 lb Date of Birth:  1940/04/03    BSA:          2.231 m Patient Age:    80 years      BP:           142/97 mmHg Patient Gender: M             HR:           35 bpm. Exam Location:  Inpatient Procedure: 2D Echo and Intracardiac Opacification Agent Indications:    Congestive heart failure  History:        Patient has prior history of Echocardiogram examinations, most                 recent 12/10/2020. CHF; Risk Factors:Hypertension and Diabetes.  Sonographer:    Eduard Roux Referring Phys: [id number] Sharrell Ku BHAGAT IMPRESSIONS  1. Global hypokinesis worse in the inferoseptal, inferior and inferolateral myocarium. Left ventricular ejection fraction, by estimation, is 25  to 30%. The left ventricle has severely decreased function. The left ventricle demonstrates global hypokinesis. There is mild concentric left ventricular hypertrophy.  2. Right ventricular systolic function is normal. The right ventricular size is normal.  3. Left atrial size was moderately dilated.  4. The mitral valve is normal in structure. No evidence of mitral valve regurgitation. No evidence of mitral stenosis. Moderate mitral annular calcification.  5. The aortic valve is tricuspid. Aortic valve regurgitation is not visualized. No aortic stenosis is present.  6. Aortic dilatation noted. There is borderline dilatation of the ascending aorta, measuring 36 mm.  7. The inferior vena cava is dilated in size with <50% respiratory variability, suggesting right atrial pressure of 15 mmHg. FINDINGS  Left Ventricle: Global hypokinesis worse in the inferoseptal, inferior and inferolateral myocarium. Left ventricular ejection fraction, by estimation, is 25 to 30%. The left ventricle has severely decreased function. The left ventricle demonstrates global hypokinesis. The left ventricular internal cavity size was normal in size. There is mild concentric left ventricular hypertrophy. Right Ventricle: The right ventricular size is normal. No increase in right ventricular wall thickness. Right ventricular systolic function is normal. Left Atrium: Left atrial size was moderately dilated. Right Atrium: Right atrial size was normal in size. Pericardium: There is no evidence of pericardial effusion. Mitral Valve: The mitral valve is normal in structure. Moderate mitral annular calcification. No evidence of mitral valve stenosis. Tricuspid Valve: The tricuspid valve is normal in structure. Tricuspid valve regurgitation is not demonstrated. No evidence of tricuspid stenosis. Aortic Valve: The aortic valve is tricuspid. Aortic valve regurgitation is not visualized. No aortic stenosis is present. Pulmonic Valve: The pulmonic valve  was normal in structure. Pulmonic valve regurgitation is not visualized. No evidence of pulmonic stenosis. Aorta: Aortic dilatation noted. There is borderline dilatation of the ascending aorta, measuring 36 mm. Venous: The inferior vena cava is dilated in size with less than 50% respiratory variability, suggesting right atrial pressure of 15 mmHg. IAS/Shunts: No atrial level shunt detected by color flow Doppler. LEFT VENTRICLE PLAX 2D LVIDd:         5.60  cm LVIDs:         5.00 cm LV PW:         1.20 cm LV IVS:        1.30 cm LVOT diam:     2.10 cm LVOT Area:     3.46 cm  IVC IVC diam: 2.80 cm LEFT ATRIUM             Index        RIGHT ATRIUM           Index LA diam:        4.10 cm 1.84 cm/m   RA Area:     20.80 cm LA Vol (A2C):   75.2 ml 33.71 ml/m  RA Volume:   62.90 ml  28.19 ml/m LA Vol (A4C):   78.0 ml 34.96 ml/m LA Biplane Vol: 81.7 ml 36.62 ml/m   AORTA Ao Root diam: 3.30 cm Ao Asc diam:  3.60 cm  SHUNTS Systemic Diam: 2.10 cm Chilton Si MD Electronically signed by Chilton Si MD Signature Date/Time: 04/11/2021/10:25:20 AM    Final      Patient Profile     81 y.o. male with past medical history of diabetes mellitus, hyperlipidemia, hypertension, congestive heart failure admitted with acute systolic congestive heart failure and ventricular ectopy.  Echocardiogram this admission shows ejection fraction 25 to 30%, mild left ventricular hypertrophy, moderate left atrial enlargement.  Assessment & Plan    1 acute systolic congestive heart failure-I/O - 6045. Wt 110.9 kg.  Symptomatically much improved.  We will change Lasix to 40 mg by mouth twice daily.  Needs fluid restriction and low-sodium diet at home.  2 cardiomyopathy-LV function again reduced this admission.  Etiology is felt potentially secondary to ectopy.  Note he has had cardiac catheterizations showing normal coronary arteries but no ischemia evaluation since 2010.  We will continue low-dose ARB (transition to [facility] as an  outpatient if blood pressure allows) and beta-blocker.  Add spironolactone 12.5 mg daily.  He did not tolerate Jardiance in the past.  He is scheduled to see Dr. Ladona Ridgel for consideration of ablation next week.  Would also likely arrange Lexiscan nuclear study to screen for new ischemia as a potential contributor.  3 ventricular ectopy-patient was seen by electrophysiology.  Continue beta-blocker.  He is scheduled to see Dr. Ladona Ridgel next week to discuss ablation.  If not a candidate for ablation and LV function remains reduced would need CRT-D.  4 hypertension-blood pressure controlled.  Continue present medications.  5 hyperlipidemia-continue statin.  Plan discharge today on present medications.  Follow-up with Dr. Ladona Ridgel next week as scheduled.  Arrange follow-up with Dr. Izora Ribas in 4 to 6-weeks.  Check bmet at office visit with Dr. Ladona Ridgel next week.  Greater than 30 minutes PA and physician time. D2  For questions or updates, please contact [facility] Please consult [email or web address] for contact info under        Signed, Olga Millers, MD  04/12/2021, 8:39 AM

## 2021-04-12 NOTE — Progress Notes (Signed)
Pt got discharged to home, discharge instructions provided and patient showed understanding to it, IV taken out,Telemonitor DC,pt left unit in wheelchair with all of the belongings accompanied with a family member (wife and daughter)  Kenae Lindquist, RN 

## 2021-04-18 ENCOUNTER — Ambulatory Visit (INDEPENDENT_AMBULATORY_CARE_PROVIDER_SITE_OTHER): Payer: Medicare Other

## 2021-04-18 ENCOUNTER — Encounter (INDEPENDENT_AMBULATORY_CARE_PROVIDER_SITE_OTHER): Payer: Self-pay

## 2021-04-18 ENCOUNTER — Ambulatory Visit: Payer: Medicare Other | Admitting: Internal Medicine

## 2021-04-18 ENCOUNTER — Other Ambulatory Visit: Payer: Self-pay

## 2021-04-18 ENCOUNTER — Encounter: Payer: Self-pay | Admitting: Internal Medicine

## 2021-04-18 VITALS — BP 108/70 | HR 62 | Ht 68.0 in | Wt 245.0 lb

## 2021-04-18 DIAGNOSIS — I493 Ventricular premature depolarization: Secondary | ICD-10-CM | POA: Diagnosis not present

## 2021-04-18 DIAGNOSIS — I5023 Acute on chronic systolic (congestive) heart failure: Secondary | ICD-10-CM

## 2021-04-18 NOTE — Patient Instructions (Addendum)
Medication Instructions:  ?Your physician recommends that you continue on your current medications as directed. Please refer to the Current Medication list given to you today. ? ?Labwork: ?You will get lab work today:  BMP ? ?Testing/Procedures: ?Your physician has recommended that you wear a holter monitor. Holter monitors are medical devices that record the heart?s electrical activity. Doctors most often use these monitors to diagnose arrhythmias. Arrhythmias are problems with the speed or rhythm of the heartbeat. The monitor is a small, portable device. You can wear one while you do your normal daily activities. This is usually used to diagnose what is causing palpitations/syncope (passing out). ? ?IN April you will wear a 3 day ZIO monitor ? ?Follow-Up: ?Your physician wants you to follow-up in: May with Cristopher Peru, MD  ? ? ?Any Other Special Instructions Will Be Listed Below (If Applicable). ? ?If you need a refill on your cardiac medications before your next appointment, please call your pharmacy.  ? ?Your physician has recommended that you wear a Zio monitor.  ? ?This monitor is a medical device that records the heart?s electrical activity. Doctors most often use these monitors to diagnose arrhythmias. Arrhythmias are problems with the speed or rhythm of the heartbeat. The monitor is a small device applied to your chest. You can wear one while you do your normal daily activities. While wearing this monitor if you have any symptoms to push the button and record what you felt. Once you have worn this monitor for the period of time provider prescribed (Usually 14 days), you will return the monitor device in the postage paid box. Once it is returned they will download the data collected and provide [place] with a report which the provider will then review and we will call you with those results. Important tips: ? ?Avoid showering during the first 24 hours of wearing the monitor. ?Avoid excessive sweating to help  maximize wear time. ?Do not submerge the device, no hot tubs, and no swimming pools. ?Keep any lotions or oils away from the patch. ?After 24 hours you may shower with the patch on. Take brief showers with your back facing the shower head.  ?Do not remove patch once it has been placed because that will interrupt data and decrease adhesive wear time. ?Push the button when you have any symptoms and write down what you were feeling. ?Once you have completed wearing your monitor, remove and place into box which has postage paid and place in your outgoing mailbox.  ?If for some reason you have misplaced your box then call our office and we can provide another box and/or mail it off for you. ? ?  ? ? ?

## 2021-04-18 NOTE — Progress Notes (Unsigned)
Enrolled patient for a 3 day Zio XT monitor to be mailed to patients home. ? ?To be worn beginning of April ?

## 2021-04-18 NOTE — Progress Notes (Signed)
? ? ? ? ?HPI ?[name] is referred for evaluation of PVC's. He presented to the hospital in late February with acute on chronic CHF and LV dysfunction. Previously he had had docuemented 11 %PVC burden. He was started on lasix and has improved. His PVC's are a left bundle morphology with an inferior axis and transition to positive at V3. He denies chest pain or sob. No syncope. He is much improved since he was in the hospital and is weighing himself.  ?Allergies  ?Allergen Reactions  ? Jardiance [Empagliflozin] Nausea Only  ? ? ? ?Current Outpatient Medications  ?Medication Sig Dispense Refill  ? aspirin EC 81 MG EC tablet Take 1 tablet (81 mg total) by mouth daily. Swallow whole. 30 tablet 11  ? buPROPion (WELLBUTRIN XL) 300 MG 24 hr tablet Take 300 mg by mouth every morning.    ? furosemide (LASIX) 40 MG tablet Take 1 tablet (40 mg total) by mouth 2 (two) times daily. 60 tablet 2  ? glimepiride (AMARYL) 4 MG tablet Take 4 mg by mouth every morning.    ? metFORMIN (GLUCOPHAGE) 500 MG tablet Take 1,000 mg by mouth every morning.    ? montelukast (SINGULAIR) 10 MG tablet Take 10 mg by mouth every morning.    ? pantoprazole (PROTONIX) 40 MG tablet Take 40 mg by mouth every morning.    ? potassium chloride SA (KLOR-CON M) 20 MEQ tablet Take 2 tablets (40 mEq total) by mouth daily. 60 tablet 2  ? rosuvastatin (CRESTOR) 40 MG tablet Take 40 mg by mouth daily.    ? spironolactone (ALDACTONE) 25 MG tablet Take 0.5 tablets (12.5 mg total) by mouth daily. 15 tablet 2  ? losartan (COZAAR) 25 MG tablet Take 0.5 tablets (12.5 mg total) by mouth at bedtime. 45 tablet 3  ? metoprolol succinate (TOPROL-XL) 50 MG 24 hr tablet Take 1 tablet (50 mg total) by mouth daily. Take with or immediately following a meal. 30 tablet 1  ? ?No current facility-administered medications for this visit.  ? ? ? ?Past Medical History:  ?Diagnosis Date  ? Acute on chronic diastolic CHF (congestive heart failure), NYHA class 3 (HCC) 04/10/2021  ?  Diabetes mellitus   ? Dyslipidemia   ? Hx of cardiac catheterization   ? a. normal cors by cath in 1998 and 2010  ? Hx of echocardiogram   ? Echocardiogram 06/10/12: Normal LV wall thickness, EF 50-55%, grade 1 diastolic dysfunction, mild LAE  ? Hypertension   ? Obesity   ? Ventricular tachycardia   ? and symptomatic PVCs - Rx with beta blocker  ? ? ?ROS: ? ? All systems reviewed and negative except as noted in the HPI. ? ? ?Past Surgical History:  ?Procedure Laterality Date  ? ESOPHAGOGASTRODUODENOSCOPY  06/18/2003  ? negative  ? LEFT HEART CATH  12/27/2015  ? Procedure: Left Heart Catheterization W Intervention; Surgeon: Arizona Constable, MD; Location: [facility] CATH; Service: Cardiology    ? LEFT HEART CATH AND CORONARY ANGIOGRAPHY N/A 10/10/2020  ? Procedure: LEFT HEART CATH AND CORONARY ANGIOGRAPHY;  Surgeon: Lyn Records, MD;  Location: [facility] INVASIVE CV LAB;  Service: Cardiovascular;  Laterality: N/A;  ? ? ? ?Family History  ?Problem Relation Age of Onset  ? Heart attack Father 70  ?     Died at age 97 from heart attack  ? Heart failure Mother 49  ?     Died from heart failure  ? Lymphoma Mother   ? Diabetes Mother   ?  Diabetes Mellitus II Maternal Grandmother   ? Other Maternal Grandfather   ?     head injury  ? Prostate cancer Paternal Grandfather   ? Depression Daughter   ? Rheum arthritis Daughter   ? Anxiety disorder Daughter   ? ? ? ?Social History  ? ?Socioeconomic History  ? Marital status: Single  ?  Spouse name: Not on file  ? Number of children: Not on file  ? Years of education: Not on file  ? Highest education level: Not on file  ?Occupational History  ? Not on file  ?Tobacco Use  ? Smoking status: Never  ? Smokeless tobacco: Never  ?Substance and Sexual Activity  ? Alcohol use: No  ? Drug use: Not on file  ? Sexual activity: Not on file  ?Other Topics Concern  ? Not on file  ?Social History Narrative  ? Not on file  ? ?Social Determinants of Health  ? ?Financial Resource Strain: Not on file   ?Food Insecurity: Not on file  ?Transportation Needs: Not on file  ?Physical Activity: Not on file  ?Stress: Not on file  ?Social Connections: Not on file  ?Intimate Partner Violence: Not on file  ? ? ? ?BP 108/70   Pulse 62   Ht 5\' 8"  (1.727 m)   Wt 245 lb (111.1 kg)   SpO2 95%   BMI 37.25 kg/m?  ? ?Physical Exam: ? ?Well appearing 81 yo man, NAD ?HEENT: Unremarkable ?Neck:  6 cm JVD, no thyromegally ?Lymphatics:  No adenopathy ?Back:  No CVA tenderness ?Lungs:  Clear with no wheezes ?HEART:  Regular rate rhythm, no murmurs, no rubs, no clicks ?Abd:  soft, positive bowel sounds, no organomegally, no rebound, no guarding ?Ext:  2 plus pulses, no edema, no cyanosis, no clubbing ?Skin:  No rashes no nodules ?Neuro:  CN II through XII intact, motor grossly intact ? ? 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 I have asked him to repeat his Zio monitor to see if the PVC burden has worsened. For now we will hold off on AA drug therapy or ablation.  ?Chronic systolic heart failure - his symptoms are class 2. He is limited in uptitration of GDMT by low bp.  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 We will follow. ?Dyslipidemia - he will continue statin therapy. ? ?[name],MD ?

## 2021-04-20 ENCOUNTER — Other Ambulatory Visit: Payer: Self-pay | Admitting: Physician Assistant

## 2021-04-20 ENCOUNTER — Telehealth: Payer: Self-pay | Admitting: Physician Assistant

## 2021-04-20 DIAGNOSIS — E875 Hyperkalemia: Secondary | ICD-10-CM

## 2021-04-20 DIAGNOSIS — N179 Acute kidney failure, unspecified: Secondary | ICD-10-CM

## 2021-04-20 NOTE — Telephone Encounter (Signed)
Paged by Corrie Dandy of [facility] regarding critical lab result.  Lab work obtained by Dr. Ladona Ridgel on Friday.  According to [facility], this is the third time they are trying to report the result.  The first time was at 4:59 PM on Friday night.  Second time was yesterday.  Although I was the on-call person yesterday between 7 AM until 5 PM and never got a page from [name].  Lab cooperative ported elevated potassium of 5.9.  No sign of hemolysis.  It appears creatinine is also worse as well.  Patient was admitted in February for acute on chronic systolic heart failure. ? ?I have called and confirmed with Karl Peters that he does not have any dizziness or feeling of passing out.  I instructed him to hold spironolactone, losartan and potassium chloride.  He has follow-up with Karl Peters on 05/06/2021, at which time we can potentially consider restart losartan if his renal function and potassium improved. ? ?Otherwise, triage nurse please order and arrange repeat basic metabolic panel either this Tuesday 3/7 or Wednesday 3/8 (dx hyperkalemia and acute kidney injury) to make sure renal function improve and potassium come down.  Patient is aware to call 911 immediately and go to the hospital if he does have any dizziness or feeling of passing out. ?

## 2021-04-21 LAB — BASIC METABOLIC PANEL
BUN/Creatinine Ratio: 19 (ref 10–24)
BUN: 46 mg/dL — ABNORMAL HIGH (ref 8–27)
CO2: 27 mmol/L (ref 20–29)
Calcium: 9.8 mg/dL (ref 8.6–10.2)
Chloride: 93 mmol/L — ABNORMAL LOW (ref 96–106)
Creatinine, Ser: 2.47 mg/dL — ABNORMAL HIGH (ref 0.76–1.27)
Glucose: 125 mg/dL — ABNORMAL HIGH (ref 70–99)
Potassium: 5.9 mmol/L (ref 3.5–5.2)
Sodium: 137 mmol/L (ref 134–144)
eGFR: 26 mL/min/{1.73_m2} — ABNORMAL LOW (ref >59)

## 2021-04-21 NOTE — Telephone Encounter (Signed)
I agree with the plan. GT ?

## 2021-04-21 NOTE — Telephone Encounter (Signed)
Spoke with the patient and have scheduled him for lab work on 3/7.  ?

## 2021-04-22 ENCOUNTER — Telehealth: Payer: Self-pay

## 2021-04-22 ENCOUNTER — Other Ambulatory Visit: Payer: Self-pay

## 2021-04-22 ENCOUNTER — Other Ambulatory Visit: Payer: Medicare Other | Admitting: *Deleted

## 2021-04-22 DIAGNOSIS — E875 Hyperkalemia: Secondary | ICD-10-CM

## 2021-04-22 DIAGNOSIS — N179 Acute kidney failure, unspecified: Secondary | ICD-10-CM

## 2021-04-22 LAB — BASIC METABOLIC PANEL
BUN/Creatinine Ratio: 18 (ref 10–24)
BUN: 46 mg/dL — ABNORMAL HIGH (ref 8–27)
CO2: 26 mmol/L (ref 20–29)
Calcium: 9.6 mg/dL (ref 8.6–10.2)
Chloride: 93 mmol/L — ABNORMAL LOW (ref 96–106)
Creatinine, Ser: 2.52 mg/dL — ABNORMAL HIGH (ref 0.76–1.27)
Glucose: 108 mg/dL — ABNORMAL HIGH (ref 70–99)
Potassium: 4.6 mmol/L (ref 3.5–5.2)
Sodium: 136 mmol/L (ref 134–144)
eGFR: 25 mL/min/{1.73_m2} — ABNORMAL LOW (ref >59)

## 2021-04-22 NOTE — Telephone Encounter (Signed)
-----   Message from Marinus Maw, MD sent at 04/21/2021  9:26 PM EST ----- ?Worsening renal function and elevated potassium. Stop potassium and diuretic. Recheck in 2 weeks.  ?

## 2021-04-22 NOTE — Telephone Encounter (Signed)
See telephone note from 04/20/2021. ? ?Pt has been advised to hold spironolactone, losartan and potassium chloride. ? ?Pt has f/u lab work scheduled for today 04/22/2021 and f/u with SW on 05/06/2021. ? ?Will continue to follow. ?

## 2021-04-25 ENCOUNTER — Other Ambulatory Visit: Payer: Self-pay

## 2021-04-25 DIAGNOSIS — E875 Hyperkalemia: Secondary | ICD-10-CM

## 2021-04-25 DIAGNOSIS — Z79899 Other long term (current) drug therapy: Secondary | ICD-10-CM

## 2021-04-25 DIAGNOSIS — N179 Acute kidney failure, unspecified: Secondary | ICD-10-CM

## 2021-04-25 MED ORDER — FUROSEMIDE 40 MG PO TABS: 40.0000 mg | ORAL_TABLET | Freq: Every day | ORAL | 2 refills | Status: DC

## 2021-04-28 ENCOUNTER — Ambulatory Visit: Payer: Medicare Other | Admitting: Internal Medicine

## 2021-05-05 NOTE — Progress Notes (Addendum)
? ? ?Office Visit  ?  ?Patient Name: Karl Peters ?Date of Encounter: 05/06/2021 ? ?Primary Care Provider:  Vicenta Aly, [place] ?Primary Cardiologist:  Werner Lean, MD ?Primary Electrophysiologist: Thompson Grayer, MD ?Chief Complaint  ?  ?Karl Peters is a 81 y.o. male presents today for hospital follow-up ? ?History of Present Illness  ?  ?[name] is a 81 y.o. male with PMH of nonobstructive CAD, HFrEF, OSA, HLD, NICM, HTN, PVCs,NSVT, DM II.  Cardiac cath completed 1998 in 2010 with normal coronary arteries, echo completed 2014 with EF of 50-55% and grade 2 DD. 2017 abnormal nuclear stress with basal inferior and inferior septal ischemia. Cardiac cath 8/22 completed with mild CAD and 30% ramus. Lexiscan completed with large defect with moderate reduction in uptake in the apical to basilar inferior location. EF severely decreased less than 30%, abnormal LV function. 14-day Zio patch with 11.3% burden of PVCs/nonsustained VT. Echo completed 10/22 with improved LV function to 50-55% and grade 1 DD. He was admitted on 2/23 for acute on chronic heart failure exacerbation.  Patient reported fatigue and exertional SOB, BNP 1143, hs troponin 19>>20.  Chest x-ray with cardiomegaly and pulmonary edema.  Patient was treated with Lasix IV and admitted.  GDMT was adjusted with negative weight loss of 5.165 L.  Echo completed 2/23 with global hypokinesis, EF of 25-30%, left ventricular hypertrophy with severely decreased function, moderately dilated LA, he was discharged home 2/25 after diuresis weight loss. ? ?Mr. Ocanas was seen by Dr. Lovena Le on 3/3 for referral of PVCs and possible ablation.  Patient is minimally thematic with PVCs.  ZIO monitor was ordered to determine PVC burden.  He reported worsening renal function and elevated potassium.  Spironolactone, Lasix, potassium were held on 3/7 due to 5.9 potassium ? ?Since last being seen in our clinic the patient reports doing better with his heart failure  symptoms. He denies chest pain, palpitations, dyspnea, PND, orthopnea, nausea, vomiting, dizziness, syncope, edema, weight gain, or early satiety.  He is currently taking Lasix 40 mg and having good diuresis.  He appears euvolemic on examination today.  Blood pressure today was 126/82 with heart rate of 58 bpm.  He is intake is under 2 L a day.  He is currently off losartan, spironolactone due to increasing creatinine. ? ?Past Medical History  ?  ?Past Medical History:  ?Diagnosis Date  ? Acute on chronic diastolic CHF (congestive heart failure), NYHA class 3 ([place]) 04/10/2021  ? Diabetes mellitus   ? Dyslipidemia   ? Hx of cardiac catheterization   ? a. normal cors by cath in 1998 and 2010  ? Hx of echocardiogram   ? Echocardiogram 06/10/12: Normal LV wall thickness, EF 99991111, grade 1 diastolic dysfunction, mild LAE  ? Hypertension   ? Obesity   ? Ventricular tachycardia   ? and symptomatic PVCs - Rx with beta blocker  ? ?Past Surgical History:  ?Procedure Laterality Date  ? ESOPHAGOGASTRODUODENOSCOPY  06/18/2003  ? negative  ? LEFT HEART CATH  12/27/2015  ? Procedure: Left Heart Catheterization W Intervention; Surgeon: Nicanor Alcon, MD; Location: [facility] CATH; Service: Cardiology    ? LEFT HEART CATH AND CORONARY ANGIOGRAPHY N/A 10/10/2020  ? Procedure: LEFT HEART CATH AND CORONARY ANGIOGRAPHY;  Surgeon: Belva Crome, MD;  Location: [facility] CV LAB;  Service: Cardiovascular;  Laterality: N/A;  ? ? ?Allergies ? ?Allergies  ?Allergen Reactions  ? Jardiance [Empagliflozin] Nausea Only  ? ? ?Home Medications  ?  ?  Current Outpatient Medications  ?Medication Sig Dispense Refill  ? aspirin EC 81 MG EC tablet Take 1 tablet (81 mg total) by mouth daily. Swallow whole. 30 tablet 11  ? buPROPion (WELLBUTRIN XL) 300 MG 24 hr tablet Take 300 mg by mouth every morning.    ? furosemide (LASIX) 40 MG tablet Take 1 tablet (40 mg total) by mouth daily. 60 tablet 2  ? glimepiride (AMARYL) 4 MG tablet Take 4 mg by mouth  every morning.    ? metFORMIN (GLUCOPHAGE) 500 MG tablet Take 1,000 mg by mouth every morning.    ? metoprolol succinate (TOPROL-XL) 50 MG 24 hr tablet Take 1 tablet (50 mg total) by mouth daily. Take with or immediately following a meal. 30 tablet 1  ? montelukast (SINGULAIR) 10 MG tablet Take 10 mg by mouth every morning.    ? pantoprazole (PROTONIX) 40 MG tablet Take 40 mg by mouth every morning.    ? rosuvastatin (CRESTOR) 40 MG tablet Take 40 mg by mouth daily.    ? ?No current facility-administered medications for this visit.  ?  ? ?Review of Systems  ?Please see the history of present illness. ? ?All other systems reviewed and are otherwise negative except as noted above. ? ?Physical Exam  ?  ?Wt Readings from Last 3 Encounters:  ?05/06/21 247 lb 6.4 oz (112.2 kg)  ?04/18/21 245 lb (111.1 kg)  ?04/12/21 244 lb 7.8 oz (110.9 kg)  ? ?VS: ?Vitals:  ? 05/06/21 1440  ?BP: 126/82  ?Pulse: (!) 58  ?SpO2: 95%  ?,Body mass index is 37.62 kg/m?. ? ?GEN: Well nourished, well developed, in no acute distress. ?Neck: Supple, no JVD, carotid bruits, or masses. ?Cardiac: S1,S2,RRR, no murmurs, rubs, or gallops. No clubbing, cyanosis, no increased edema.  ?Radials/PT 2+ and equal bilaterally.  ?Respiratory:  Respirations regular and unlabored, clear to auscultation bilaterally. ?MS: no deformity or atrophy. ?Skin: warm and dry, no rash. ?Neuro:  Strength and sensation are intact. ?Psych: Normal affect. ? ?EKG/LABS/Other Studies Reviewed  ?  ?ECG personally reviewed by me today -not ordered  ?Risk Assessment/Calculations:   ?  ?Lab Results  ?Component Value Date  ? WBC 8.4 04/11/2021  ? HGB 13.3 04/11/2021  ? HCT 42.0 04/11/2021  ? MCV 85.0 04/11/2021  ? PLT 234 04/11/2021  ? ?Lab Results  ?Component Value Date  ? CREATININE 2.52 (H) 04/22/2021  ? BUN 46 (H) 04/22/2021  ? NA 136 04/22/2021  ? K 4.6 04/22/2021  ? CL 93 (L) 04/22/2021  ? CO2 26 04/22/2021  ? ?Lab Results  ?Component Value Date  ? ALT 22 10/14/2020  ? AST 24  10/14/2020  ? ALKPHOS 66 10/14/2020  ? BILITOT 0.5 10/14/2020  ? ?Lab Results  ?Component Value Date  ? CHOL 145 04/11/2021  ? HDL 36 (L) 04/11/2021  ? [place] 92 04/11/2021  ? TRIG 83 04/11/2021  ? CHOLHDL 4.0 04/11/2021  ?  ?Lab Results  ?Component Value Date  ? HGBA1C 6.5 (H) 04/11/2021  ? ? ?Assessment & Plan  ?  ?1.  HFrEF: ?-Echo completed with LVEF of 25 to 30%, global hypokinesis, and mild LVH. ?-Current GDMT down titrated due to elevated creatinine and potassium. Toprol 50 mg, ? Lasix reduced to 40 mg daily ?-Low sodium diet, fluid restriction <2L, and daily weights encouraged. Educated to contact our office for weight gain of 2 lbs overnight or 5 lbs in one week.  ?-BMET today, if renal function improved we may rechallenge losartan.  If  renal function not improve we may start hydralazine 10 mg twice daily ? ?2. Nonobstructive CAD: ?-Stable with no anginal symptoms. j ?-s/p cardiac cath on 8/22 with nonobstructive CAD ?-Continue GDMT with Toprol, ASA, Crestor ? ?3. Ventricular ectopy: ?-Patient currently with a 14-day Zio patch to evaluate PVC burden. Patient is interested in ablation but Dr. Lovena Le would like to see PVCs prior to moving forward. ?-Minimally symptomatic PVCs, follow-up with Dr. Lovena Le scheduled 5/19 ? ?4. Hypertension:  ?Blood pressure today is 126/82 ?-Continue current hypertensive therapy ? ?5. Hyperlipidemia:  ?-Continue current statin therapy ? ?Shared Decision Making/Informed Consent ?The risks [chest pain, shortness of breath, cardiac arrhythmias, dizziness, blood pressure fluctuations, myocardial infarction, stroke/transient ischemic attack, nausea, vomiting, allergic reaction, radiation exposure, metallic taste sensation and life-threatening complications (estimated to be 1 in 10,000)], benefits (risk stratification, diagnosing coronary artery disease, treatment guidance) and alternatives of a nuclear stress test were discussed in detail with Mr. Jobson and he agrees to proceed.   ? ?Disposition: Follow-up with Werner Lean, MD  in 4 months ?   ? ?Medication Adjustments/Labs and Tests Ordered: ?Current medicines are reviewed at length with the patient today.  Concerns regarding m

## 2021-05-06 ENCOUNTER — Encounter: Payer: Self-pay | Admitting: Nurse Practitioner

## 2021-05-06 ENCOUNTER — Other Ambulatory Visit: Payer: Self-pay

## 2021-05-06 ENCOUNTER — Ambulatory Visit: Payer: Medicare Other | Admitting: Nurse Practitioner

## 2021-05-06 VITALS — BP 126/82 | HR 58 | Ht 68.0 in | Wt 247.4 lb

## 2021-05-06 DIAGNOSIS — Z79899 Other long term (current) drug therapy: Secondary | ICD-10-CM | POA: Diagnosis not present

## 2021-05-06 DIAGNOSIS — N179 Acute kidney failure, unspecified: Secondary | ICD-10-CM | POA: Diagnosis not present

## 2021-05-06 DIAGNOSIS — E875 Hyperkalemia: Secondary | ICD-10-CM

## 2021-05-06 NOTE — Patient Instructions (Signed)
Medication Instructions:  ?Your physician recommends that you continue on your current medications as directed. Please refer to the Current Medication list given to you today. ? ?*If you need a refill on your cardiac medications before your next appointment, please call your pharmacy* ? ? ?Lab Work: ?TODAY:  BMET ? ?If you have labs (blood work) drawn today and your tests are completely normal, you will receive your results only by: ?MyChart Message (if you have MyChart) OR ?A paper copy in the mail ?If you have any lab test that is abnormal or we need to change your treatment, we will call you to review the results. ? ? ?Testing/Procedures: ?None ordered ? ? ?Follow-Up: ?At [facility], you and your health needs are our priority.  As part of our continuing mission to provide you with exceptional heart care, we have created designated Provider Care Teams.  These Care Teams include your primary Cardiologist (physician) and Advanced Practice Providers (APPs -  Physician Assistants and Nurse Practitioners) who all work together to provide you with the care you need, when you need it. ? ?We recommend signing up for the patient portal called "MyChart".  Sign up information is provided on this After Visit Summary.  MyChart is used to connect with patients for Virtual Visits (Telemedicine).  Patients are able to view lab/test results, encounter notes, upcoming appointments, etc.  Non-urgent messages can be sent to your provider as well.   ?To learn more about what you can do with MyChart, go to [email or web address].   ? ?Your next appointment:   ?4 month(s) ? ?The format for your next appointment:   ?In Person ? ?Provider:   ?Christell Constant, MD   ? ? ?Other Instructions ? ? ?

## 2021-05-07 ENCOUNTER — Telehealth: Payer: Self-pay | Admitting: *Deleted

## 2021-05-07 ENCOUNTER — Telehealth: Payer: Self-pay

## 2021-05-07 DIAGNOSIS — N179 Acute kidney failure, unspecified: Secondary | ICD-10-CM

## 2021-05-07 DIAGNOSIS — I428 Other cardiomyopathies: Secondary | ICD-10-CM

## 2021-05-07 LAB — BASIC METABOLIC PANEL
BUN/Creatinine Ratio: 14 (ref 10–24)
BUN: 31 mg/dL — ABNORMAL HIGH (ref 8–27)
CO2: 24 mmol/L (ref 20–29)
Calcium: 9.4 mg/dL (ref 8.6–10.2)
Chloride: 102 mmol/L (ref 96–106)
Creatinine, Ser: 2.25 mg/dL — ABNORMAL HIGH (ref 0.76–1.27)
Glucose: 63 mg/dL — ABNORMAL LOW (ref 70–99)
Potassium: 4.6 mmol/L (ref 3.5–5.2)
Sodium: 143 mmol/L (ref 134–144)
eGFR: 29 mL/min/{1.73_m2} — ABNORMAL LOW (ref >59)

## 2021-05-07 MED ORDER — HYDRALAZINE HCL 10 MG PO TABS: 10.0000 mg | ORAL_TABLET | Freq: Three times a day (TID) | ORAL | 1 refills | Status: DC

## 2021-05-07 MED ORDER — FUROSEMIDE 40 MG PO TABS: ORAL_TABLET | ORAL | 3 refills | Status: DC

## 2021-05-07 NOTE — Telephone Encounter (Signed)
Spoke with pt and advised of recommendations for testing per Cristi Loron.  Pt states he is willing to proceed with Lexiscan Myoview.  Order placed and pt advised he will be contacted re: scheduling of test.  Pt verbalizes understanding and agrees with current plan. ?

## 2021-05-07 NOTE — Telephone Encounter (Signed)
-----   Message from Elizabeth Palau, NP sent at 05/07/2021  8:06 AM EDT ----- ?Good Morning, ? ?Mr. Montante's potassium is stable but his creatinine remains elevated. We will refer him to nephrology,and add hydralazine 10 mg BID. Let's also have him change his lasix to 20 mg M,W,F, and 40 mg on Tu,Thu,Sat. He needs to also keep strict weights and call us if he notices 3 lb gain in 24 hrs and 5 lbs in a week. ? ?Thanks, ? ?Robin Searing, NP ?

## 2021-05-07 NOTE — Telephone Encounter (Signed)
-----   Message from Tempie Donning, NP sent at 05/07/2021 10:06 AM EDT ----- ?Regarding: Lexiscan needed for evaluation of NICM ?Good morning, ? ?Could you please reach out to Mr.Stratton and schedule him for a [employer]. I spoke with Dr. Gasper Sells today and he would like to have this done to rule out new ischemia as possible contributor to NICM. ? ?Thank you ? ?Ambrose Pancoast, NP ? ?

## 2021-05-08 ENCOUNTER — Telehealth (HOSPITAL_COMMUNITY): Payer: Self-pay | Admitting: *Deleted

## 2021-05-08 NOTE — Telephone Encounter (Signed)
Patient given detailed instructions per Myocardial Perfusion Study Information Sheet for the test on 05/12/2021 at 7:30. Patient notified to arrive 15 minutes early and that it is imperative to arrive on time for appointment to keep from having the test rescheduled. ? If you need to cancel or reschedule your appointment, please call the office within 24 hours of your appointment. . Patient verbalized understanding.Nelson Chimes S ? ? ?

## 2021-05-09 ENCOUNTER — Telehealth: Payer: Self-pay | Admitting: Nurse Practitioner

## 2021-05-09 DIAGNOSIS — I429 Cardiomyopathy, unspecified: Secondary | ICD-10-CM

## 2021-05-09 NOTE — Telephone Encounter (Signed)
Shared Decision Making/Informed Consent ?The risks [chest pain, shortness of breath, cardiac arrhythmias, dizziness, blood pressure fluctuations, myocardial infarction, stroke/transient ischemic attack, nausea, vomiting, allergic reaction, radiation exposure, metallic taste sensation and life-threatening complications (estimated to be 1 in 10,000)], benefits (risk stratification, diagnosing coronary artery disease, treatment guidance) and alternatives of a nuclear stress test were discussed in detail with Karl Peters and he agrees to proceed.  ? ?Ambrose Pancoast, NP ?

## 2021-05-12 ENCOUNTER — Ambulatory Visit (HOSPITAL_COMMUNITY): Payer: Medicare Other | Attending: Cardiology

## 2021-05-12 ENCOUNTER — Other Ambulatory Visit: Payer: Self-pay

## 2021-05-12 DIAGNOSIS — I428 Other cardiomyopathies: Secondary | ICD-10-CM | POA: Insufficient documentation

## 2021-05-12 LAB — MYOCARDIAL PERFUSION IMAGING
LV dias vol: 164 mL (ref 62–150)
LV sys vol: 106 mL
Nuc Stress EF: 36 %
Peak HR: 74 {beats}/min
Rest HR: 54 {beats}/min
Rest Nuclear Isotope Dose: 10.3 mCi
SDS: 3
SRS: 3
SSS: 6
ST Depression (mm): 0 mm
Stress Nuclear Isotope Dose: 30.6 mCi
TID: 1.01

## 2021-05-12 MED ORDER — TECHNETIUM TC 99M TETROFOSMIN IV KIT
10.3000 | PACK | Freq: Once | INTRAVENOUS | Status: AC | PRN
  Administered 2021-05-12: 10.3 via INTRAVENOUS
  Filled 2021-05-12: qty 11

## 2021-05-12 MED ORDER — TECHNETIUM TC 99M TETROFOSMIN IV KIT
30.6000 | PACK | Freq: Once | INTRAVENOUS | Status: AC | PRN
  Administered 2021-05-12: 30.6 via INTRAVENOUS
  Filled 2021-05-12: qty 31

## 2021-05-12 MED ORDER — REGADENOSON 0.4 MG/5ML IV SOLN
0.4000 mg | Freq: Once | INTRAVENOUS | Status: AC
  Administered 2021-05-12: 0.4 mg via INTRAVENOUS

## 2021-06-01 DIAGNOSIS — I493 Ventricular premature depolarization: Secondary | ICD-10-CM

## 2021-07-04 ENCOUNTER — Encounter: Payer: Self-pay | Admitting: Internal Medicine

## 2021-07-04 ENCOUNTER — Ambulatory Visit: Payer: Medicare Other | Admitting: Internal Medicine

## 2021-07-04 VITALS — BP 152/80 | HR 58 | Ht 68.0 in | Wt 249.0 lb

## 2021-07-04 DIAGNOSIS — I5023 Acute on chronic systolic (congestive) heart failure: Secondary | ICD-10-CM

## 2021-07-04 DIAGNOSIS — I493 Ventricular premature depolarization: Secondary | ICD-10-CM

## 2021-07-04 NOTE — Progress Notes (Signed)
HPI [name] returns today for followup. He is a pleasant obese 81 yo man with a h/o CAD, DCM, HTN, and PVC's. He was seen by me and noted to have LV dysfunction which has been up and down. He has about 20% PVC burden. He does not feel his palpitations. He has class 2 dyspnea. 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 No syncope.  Allergies  Allergen Reactions   Jardiance [Empagliflozin] Nausea Only     Current Outpatient Medications  Medication Sig Dispense Refill   aspirin EC 81 MG EC tablet Take 1 tablet (81 mg total) by mouth daily. Swallow whole. 30 tablet 11   buPROPion (WELLBUTRIN XL) 300 MG 24 hr tablet Take 300 mg by mouth every morning.     furosemide (LASIX) 40 MG tablet Take 1/2 tablet by mouth on Mondays, Wednesdays, Fridays, take 1 tablet by mouth on Tuesdays, Thursdays, Saturdays, & Sundays 122 tablet 3   glimepiride (AMARYL) 4 MG tablet Take 4 mg by mouth every morning.     hydrALAZINE (APRESOLINE) 10 MG tablet Take 1 tablet (10 mg total) by mouth 3 (three) times daily. 270 tablet 1   metFORMIN (GLUCOPHAGE) 500 MG tablet Take 1,000 mg by mouth every morning.     metoprolol succinate (TOPROL-XL) 50 MG 24 hr tablet Take 1 tablet (50 mg total) by mouth daily. Take with or immediately following a meal. 30 tablet 1   montelukast (SINGULAIR) 10 MG tablet Take 10 mg by mouth every morning.     pantoprazole (PROTONIX) 40 MG tablet Take 40 mg by mouth every morning.     rosuvastatin (CRESTOR) 40 MG tablet Take 40 mg by mouth daily.     No current facility-administered medications for this visit.     Past Medical History:  Diagnosis Date   Acute on chronic diastolic CHF (congestive heart failure), NYHA class 3 ([facility]) 04/10/2021   Diabetes mellitus    Dyslipidemia    Hx of cardiac catheterization    a. normal cors by cath in 1998 and 2010   Hx of echocardiogram    Echocardiogram 06/10/12: Normal LV wall thickness, EF 50-55%, grade 1  diastolic dysfunction, mild LAE   Hypertension    Obesity    Ventricular tachycardia ([facility])    and symptomatic PVCs - Rx with beta blocker    ROS:   All systems reviewed and negative except as noted in the HPI.   Past Surgical History:  Procedure Laterality Date   ESOPHAGOGASTRODUODENOSCOPY  06/18/2003   negative   LEFT HEART CATH  12/27/2015   Procedure: Left Heart Catheterization W Intervention; Surgeon: Arizona Constable, MD; Location: [facility] CATH; Service: Cardiology     LEFT HEART CATH AND CORONARY ANGIOGRAPHY N/A 10/10/2020   Procedure: LEFT HEART CATH AND CORONARY ANGIOGRAPHY;  Surgeon: Lyn Records, MD;  Location: [facility] INVASIVE CV LAB;  Service: Cardiovascular;  Laterality: N/A;     Family History  Problem Relation Age of Onset   Heart attack Father 17       Died at age 62 from heart attack   Heart failure Mother 51       Died from heart failure   Lymphoma Mother    Diabetes Mother    Diabetes Mellitus II Maternal Grandmother    Other Maternal Grandfather        head injury   Prostate cancer Paternal Grandfather    Depression Daughter    Rheum arthritis  Daughter    Anxiety disorder Daughter      Social History   Socioeconomic History   Marital status: Single    Spouse name: Not on file   Number of children: Not on file   Years of education: Not on file   Highest education level: Not on file  Occupational History   Not on file  Tobacco Use   Smoking status: Never   Smokeless tobacco: Never  Substance and Sexual Activity   Alcohol use: No   Drug use: Not on file   Sexual activity: Not on file  Other Topics Concern   Not on file  Social History Narrative   Not on file   Social Determinants of Health   Financial Resource Strain: Not on file  Food Insecurity: Not on file  Transportation Needs: Not on file  Physical Activity: Not on file  Stress: Not on file  Social Connections: Not on file  Intimate Partner Violence: Not on file     BP  (!) 152/80   Pulse (!) 58   Ht 5\' 8"  (1.727 m)   Wt 249 lb (112.9 kg)   SpO2 94%   BMI 37.86 kg/m   Physical Exam:  Well appearing 81 yo man, NAD HEENT: Unremarkable Neck:  No JVD, no thyromegally Lymphatics:  No adenopathy Back:  No CVA tenderness Lungs:  Clear with no edema.  HEART:  Regular rate rhythm, no murmurs, no rubs, no clicks Abd:  soft, positive bowel sounds, no organomegally, no rebound, no guarding Ext:  2 plus pulses, no edema, no cyanosis, no clubbing Skin:  No rashes no nodules Neuro:  CN II through XII intact, motor grossly intact   DEVICE  Normal device function.  See PaceArt for details.   Assess/Plan:  PVC's - at this point the patient is asymptomatic. His advanced age, habitus and comorbidities makes him a less than ideal candidate for ablation. I would recommed watchful waiting. Chronic systolic heart failure -he is class 2. He will continue his current medical therapies. Obesity - he is encouraged to lose weight HTN - his SBP is elevated. I encouraged a low sodium diet. Continue current meds. 76 Karl Clinch,MD

## 2021-07-04 NOTE — Patient Instructions (Addendum)
Medication Instructions:  ?Your physician recommends that you continue on your current medications as directed. Please refer to the Current Medication list given to you today. ? ?Labwork: ?None ordered. ? ?Testing/Procedures: ?None ordered. ? ?Follow-Up: ?Your physician wants you to follow-up in: as needed ? ?Any Other Special Instructions Will Be Listed Below (If Applicable). ? ?If you need a refill on your cardiac medications before your next appointment, please call your pharmacy.  ? ?Important Information About Sugar ? ? ? ? ? ? ? ?

## 2021-07-08 ENCOUNTER — Other Ambulatory Visit: Payer: Self-pay | Admitting: Student

## 2021-09-17 ENCOUNTER — Ambulatory Visit: Payer: Medicare Other | Admitting: Internal Medicine

## 2021-09-17 ENCOUNTER — Encounter: Payer: Self-pay | Admitting: Internal Medicine

## 2021-09-17 ENCOUNTER — Other Ambulatory Visit: Payer: Self-pay

## 2021-09-17 VITALS — BP 150/80 | HR 86 | Ht 68.5 in | Wt 260.0 lb

## 2021-09-17 DIAGNOSIS — I5020 Unspecified systolic (congestive) heart failure: Secondary | ICD-10-CM

## 2021-09-17 DIAGNOSIS — I4729 Other ventricular tachycardia: Secondary | ICD-10-CM | POA: Diagnosis not present

## 2021-09-17 DIAGNOSIS — I428 Other cardiomyopathies: Secondary | ICD-10-CM

## 2021-09-17 DIAGNOSIS — I493 Ventricular premature depolarization: Secondary | ICD-10-CM | POA: Diagnosis not present

## 2021-09-17 DIAGNOSIS — I2510 Atherosclerotic heart disease of native coronary artery without angina pectoris: Secondary | ICD-10-CM

## 2021-09-17 MED ORDER — ENTRESTO 24-26 MG PO TABS: 1.0000 | ORAL_TABLET | Freq: Two times a day (BID) | ORAL | 11 refills | Status: DC

## 2021-09-17 NOTE — Patient Instructions (Addendum)
Medication Instructions:  Your physician has recommended you make the following change in your medication:  START: sacubitril- valsartan (Entresto) 24/26 mg by mouth twice daily We have provided you with 1 month worth of samples  and a 1 month free trial   *If you need a refill on your cardiac medications before your next appointment, please call your pharmacy*   Lab Work: IN 2 WEEKS in [place]: BMP, BNP  If you have labs (blood work) drawn today and your tests are completely normal, you will receive your results only by: MyChart Message (if you have MyChart) OR A paper copy in the mail If you have any lab test that is abnormal or we need to change your treatment, we will call you to review the results.   Testing/Procedures: NONE   Follow-Up: At [facility], you and your health needs are our priority.  As part of our continuing mission to provide you with exceptional heart care, we have created designated Provider Care Teams.  These Care Teams include your primary Cardiologist (physician) and Advanced Practice Providers (APPs -  Physician Assistants and Nurse Practitioners) who all work together to provide you with the care you need, when you need it.  We recommend signing up for the patient portal called "MyChart".  Sign up information is provided on this After Visit Summary.  MyChart is used to connect with patients for Virtual Visits (Telemedicine).  Patients are able to view lab/test results, encounter notes, upcoming appointments, etc.  Non-urgent messages can be sent to your provider as well.   To learn more about what you can do with MyChart, go to [email or web address].    Your next appointment:   5 month(s)  The format for your next appointment:   In Person  Provider:   Riley Lam, MD {  Important Information About Sugar

## 2021-09-17 NOTE — Progress Notes (Signed)
Cardiology Office Note:    Date:  09/17/2021   ID:  Karl Peters, DOB 05-03-1940, MRN [id number]  PCP:  Elizabeth Palau, FNP   [facility] Providers Cardiologist:  Christell Constant, MD Electrophysiologist:  [name], MD     Referring MD: Elizabeth Palau, FNP   CC: F/u HF  History of Present Illness:    Karl Peters is a 81 y.o. 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 Ladona Ridgel, previously seen by Dr. Johney Frame.    Patient notes that he is doing ok.   Still gets our and mows is lawn and able to do things he wants to do.  No chest pain or pressure.  No SOB/DOE and no PND/Orthopnea.  Gain a few pounds but with no leg swelling. Takes between 20 to 40 mg lasix on the weekdays.  No palpitations or syncope .  Wife has new Afib s/p DCCV.  She is on AAD.  They are experiencing some financial hardship.  Past Medical History:  Diagnosis Date   Acute on chronic diastolic CHF (congestive heart failure), NYHA class 3 (HCC) 04/10/2021   Diabetes mellitus    Dyslipidemia    Hx of cardiac catheterization    a. normal cors by cath in 1998 and 2010   Hx of echocardiogram    Echocardiogram 06/10/12: Normal LV wall thickness, EF 50-55%, grade 1 diastolic dysfunction, mild LAE   Hypertension    Obesity    Ventricular tachycardia (HCC)    and symptomatic PVCs - Rx with beta blocker    Past Surgical History:  Procedure Laterality Date   ESOPHAGOGASTRODUODENOSCOPY  06/18/2003   negative   LEFT HEART CATH  12/27/2015   Procedure: Left Heart Catheterization W Intervention; Surgeon: Arizona Constable, MD; Location: [facility] CATH; Service: Cardiology     LEFT HEART CATH AND CORONARY ANGIOGRAPHY N/A 10/10/2020   Procedure: LEFT HEART CATH AND CORONARY ANGIOGRAPHY;  Surgeon: Lyn Records, MD;  Location: [facility] INVASIVE CV LAB;  Service: Cardiovascular;  Laterality: N/A;    Current Medications: Current Meds   Medication Sig   aspirin EC 81 MG EC tablet Take 1 tablet (81 mg total) by mouth daily. Swallow whole.   buPROPion (WELLBUTRIN XL) 300 MG 24 hr tablet Take 300 mg by mouth every morning.   furosemide (LASIX) 40 MG tablet Take 1/2 tablet by mouth on Mondays, Wednesdays, Fridays, take 1 tablet by mouth on Tuesdays, Thursdays, Saturdays, & Sundays   glimepiride (AMARYL) 4 MG tablet Take 4 mg by mouth every morning.   hydrALAZINE (APRESOLINE) 10 MG tablet Take 1 tablet (10 mg total) by mouth 3 (three) times daily.   metFORMIN (GLUCOPHAGE) 500 MG tablet Take 1,000 mg by mouth every morning.   metoprolol succinate (TOPROL-XL) 50 MG 24 hr tablet Take 1 tablet (50 mg total) by mouth daily. Take with or immediately following a meal.   montelukast (SINGULAIR) 10 MG tablet Take 10 mg by mouth every morning.   pantoprazole (PROTONIX) 40 MG tablet Take 40 mg by mouth every morning.   rosuvastatin (CRESTOR) 40 MG tablet Take 40 mg by mouth daily.   sacubitril-valsartan (ENTRESTO) 24-26 MG Take 1 tablet by mouth 2 (two) times daily.     Allergies:   Jardiance [empagliflozin]   Social History   Socioeconomic History   Marital status: Single    Spouse name: Not on file   Number of children: Not on file  Years of education: Not on file   Highest education level: Not on file  Occupational History   Not on file  Tobacco Use   Smoking status: Never   Smokeless tobacco: Never  Substance and Sexual Activity   Alcohol use: No   Drug use: Not on file   Sexual activity: Not on file  Other Topics Concern   Not on file  Social History Narrative   Not on file   Social Determinants of Health   Financial Resource Strain: Not on file  Food Insecurity: Not on file  Transportation Needs: Not on file  Physical Activity: Not on file  Stress: Not on file  Social Connections: Not on file     Family History: The patient's family history includes Anxiety disorder in his daughter; Depression in his  daughter; Diabetes in his mother; Diabetes Mellitus II in his maternal grandmother; Heart attack (age of onset: 76) in his father; Heart failure (age of onset: 42) in his mother; Lymphoma in his mother; Other in his maternal grandfather; Prostate cancer in his paternal grandfather; Rheum arthritis in his daughter.  ROS:   Please see the history of present illness.     All other systems reviewed and are negative.  EKGs/Labs/Other Studies Reviewed:    The following studies were reviewed today:  EKG:  EKG is  ordered today.  The ekg ordered today demonstrates  09/17/21: SR 1st HB and Frequent PVCs in couplets  LEFT HEART CATH AND CORONARY ANGIOGRAPHY 10/10/2020  Narrative   No significant obstructive coronary disease in the major arteries.   LAD with segmental 50% mid stenosis.  Second diagonal with 70% ostial to proximal narrowing.  Vessel covers a very small territory and bifurcates proximally.   Codominant widely patent RCA   30% proximal ramus intermedius.   Codominant right coronary without significant obstruction.   Normal LVEDP  RECOMMENDATIONS: Per treating team EP evaluation is planned. Risk factor modification to prevent progression of mild coronary atherosclerosis      ECHO LIMITED W IMAGE ENHANCING AGENT 04/11/2021 1. Global hypokinesis worse in the inferoseptal, inferior and inferolateral myocarium. Left ventricular ejection fraction, by estimation, is 25 to 30%. The left ventricle has severely decreased function. The left ventricle demonstrates global hypokinesis. There is mild concentric left ventricular hypertrophy. 2. Right ventricular systolic function is normal. The right ventricular size is normal. 3. Left atrial size was moderately dilated. 4. The mitral valve is normal in structure. No evidence of mitral valve regurgitation. No evidence of mitral stenosis. Moderate mitral annular calcification. 5. The aortic valve is tricuspid. Aortic valve regurgitation is not  visualized. No aortic stenosis is present. 6. Aortic dilatation noted. There is borderline dilatation of the ascending aorta, measuring 36 mm. 7. The inferior vena cava is dilated in size with <50% respiratory variability, suggesting right atrial pressure of 15 mmHg.   Recent Labs: 10/14/2020: ALT 22 04/10/2021: B Natriuretic Peptide 1,143.3; TSH 2.037 04/11/2021: Hemoglobin 13.3; Platelets 234 04/12/2021: Magnesium 1.6 05/06/2021: BUN 31; Creatinine, Ser 2.25; Potassium 4.6; Sodium 143  Recent Lipid Panel    Component Value Date/Time   CHOL 145 04/11/2021 0116   TRIG 83 04/11/2021 0116   HDL 36 (L) 04/11/2021 0116   CHOLHDL 4.0 04/11/2021 0116   VLDL 17 04/11/2021 0116   LDLCALC 92 04/11/2021 0116       Physical Exam:    VS:  BP (!) 150/80   Pulse 86   Ht 5' 8.5" (1.74 m)   Wt 260 lb (  117.9 kg)   SpO2 97%   BMI 38.96 kg/m     Wt Readings from Last 3 Encounters:  09/17/21 260 lb (117.9 kg)  07/04/21 249 lb (112.9 kg)  05/12/21 247 lb (112 kg)    Gen: no distress, morbid obesity  Neck: No JVD Ears: Right Frank Sign Cardiac: No Rubs or Gallops, no murmur, RRR +2 radial pulses Respiratory: Clear to auscultation bilaterally, normal effort, normal  respiratory rate GI: Soft, nontender, non-distended  MS: No  edema;  moves all extremities Integument: Skin feels warm Neuro:  At time of evaluation, alert and oriented to person/place/time/situation  Psych: Normal affect, patient feels ok  ASSESSMENT:    1. HFrEF (heart failure with reduced ejection fraction) (HCC)   2. Non-ischemic cardiomyopathy (HCC)   3. NSVT (nonsustained ventricular tachycardia) (HCC)   4. Frequent PVCs   5. Morbid obesity (HCC)   6. Coronary artery disease involving native coronary artery of native heart without angina pectoris    PLAN:    Heart Failure Reduced Ejection Fraction Frequent PVCs- seen by EP, no plans for aggressive therapy per EP Mild non obstructive CAD Morbid Obesity - NYHA class  I, Stage C, euvolemic, etiology from PVCs suspected - Diuretic regimen: Lasix load dose - continue metoprolol 50 mg PO daily - Prior Nausea with SGLT2i; not amenable to re-trial  We will start Entresto 24/26 and recheck BMP and BP in [employer] in 1-2 weeks; he will check his BP there -based on results will offer PharmD clinic (rapid up-titration of ARNI, then MRA, then zetia for LDL goal of at least 70 - continue hydralazien for now, likely switch to the above; if cost is issues will use losartan instead (he did well with this in the past before issues getting it  Will see me or APP in [place] in January; will discuss CMR at that time for targeted therapy conversation, I suspect he would not be offered ICD  Time Spent Directly with Patient:   I have spent a total of 40 minutes with the patient reviewing notes, imaging, EKGs, labs and examining the patient as well as establishing an assessment and plan that was discussed personally with the patient.  > 50% of time was spent in direct patient care and discussing financially difficulties.       Medication Adjustments/Labs and Tests Ordered: Current medicines are reviewed at length with the patient today.  Concerns regarding medicines are outlined above.  Orders Placed This Encounter  Procedures   Basic metabolic panel   Pro b natriuretic peptide (BNP)   EKG 12-Lead   Meds ordered this encounter  Medications   sacubitril-valsartan (ENTRESTO) 24-26 MG    Sig: Take 1 tablet by mouth 2 (two) times daily.    Dispense:  60 tablet    Refill:  11    Patient Instructions  Medication Instructions:  Your physician has recommended you make the following change in your medication:  START: sacubitril- valsartan (Entresto) 24/26 mg by mouth twice daily  *If you need a refill on your cardiac medications before your next appointment, please call your pharmacy*   Lab Work: IN 2 WEEKS in [place]: BMP, BNP  If you have labs (blood  work) drawn today and your tests are completely normal, you will receive your results only by: MyChart Message (if you have MyChart) OR A paper copy in the mail If you have any lab test that is abnormal or we need to change your treatment, we will call  you to review the results.   Testing/Procedures: NONE   Follow-Up: At [facility], you and your health needs are our priority.  As part of our continuing mission to provide you with exceptional heart care, we have created designated Provider Care Teams.  These Care Teams include your primary Cardiologist (physician) and Advanced Practice Providers (APPs -  Physician Assistants and Nurse Practitioners) who all work together to provide you with the care you need, when you need it.  We recommend signing up for the patient portal called "MyChart".  Sign up information is provided on this After Visit Summary.  MyChart is used to connect with patients for Virtual Visits (Telemedicine).  Patients are able to view lab/test results, encounter notes, upcoming appointments, etc.  Non-urgent messages can be sent to your provider as well.   To learn more about what you can do with MyChart, go to [email or web address].    Your next appointment:   5 month(s)  The format for your next appointment:   In Person  Provider:   Riley Lam, MD {  Important Information About Sugar         Signed, Christell Constant, MD  09/17/2021 3:36 PM    [facility]

## 2021-10-27 DIAGNOSIS — N1832 Chronic kidney disease, stage 3b: Secondary | ICD-10-CM | POA: Insufficient documentation

## 2021-10-27 DIAGNOSIS — N183 Chronic kidney disease, stage 3 unspecified: Secondary | ICD-10-CM | POA: Insufficient documentation

## 2022-10-23 ENCOUNTER — Other Ambulatory Visit: Payer: Self-pay

## 2022-10-23 ENCOUNTER — Inpatient Hospital Stay (HOSPITAL_COMMUNITY)
Admission: EM | Admit: 2022-10-23 | Discharge: 2022-10-26 | DRG: 291 | Disposition: A | Discharge status: Home / Self Care | Payer: Medicare Other | Attending: Family Medicine | Admitting: Family Medicine

## 2022-10-23 ENCOUNTER — Encounter (HOSPITAL_COMMUNITY): Payer: Self-pay

## 2022-10-23 ENCOUNTER — Emergency Department (HOSPITAL_COMMUNITY): Payer: Medicare Other

## 2022-10-23 DIAGNOSIS — I5021 Acute systolic (congestive) heart failure: Secondary | ICD-10-CM | POA: Diagnosis not present

## 2022-10-23 DIAGNOSIS — I472 Ventricular tachycardia, unspecified: Secondary | ICD-10-CM | POA: Diagnosis present

## 2022-10-23 DIAGNOSIS — R112 Nausea with vomiting, unspecified: Secondary | ICD-10-CM | POA: Diagnosis present

## 2022-10-23 DIAGNOSIS — I428 Other cardiomyopathies: Secondary | ICD-10-CM | POA: Diagnosis present

## 2022-10-23 DIAGNOSIS — Z6838 Body mass index (BMI) 38.0-38.9, adult: Secondary | ICD-10-CM

## 2022-10-23 DIAGNOSIS — Z1152 Encounter for screening for COVID-19: Secondary | ICD-10-CM | POA: Diagnosis not present

## 2022-10-23 DIAGNOSIS — E669 Obesity, unspecified: Secondary | ICD-10-CM | POA: Diagnosis present

## 2022-10-23 DIAGNOSIS — I2510 Atherosclerotic heart disease of native coronary artery without angina pectoris: Secondary | ICD-10-CM | POA: Diagnosis present

## 2022-10-23 DIAGNOSIS — E785 Hyperlipidemia, unspecified: Secondary | ICD-10-CM | POA: Diagnosis present

## 2022-10-23 DIAGNOSIS — I509 Heart failure, unspecified: Secondary | ICD-10-CM | POA: Diagnosis not present

## 2022-10-23 DIAGNOSIS — E119 Type 2 diabetes mellitus without complications: Secondary | ICD-10-CM | POA: Diagnosis present

## 2022-10-23 DIAGNOSIS — Z79899 Other long term (current) drug therapy: Secondary | ICD-10-CM

## 2022-10-23 DIAGNOSIS — J90 Pleural effusion, not elsewhere classified: Secondary | ICD-10-CM | POA: Insufficient documentation

## 2022-10-23 DIAGNOSIS — I77810 Thoracic aortic ectasia: Secondary | ICD-10-CM | POA: Diagnosis present

## 2022-10-23 DIAGNOSIS — Z91128 Patient's intentional underdosing of medication regimen for other reason: Secondary | ICD-10-CM

## 2022-10-23 DIAGNOSIS — R32 Unspecified urinary incontinence: Secondary | ICD-10-CM | POA: Diagnosis present

## 2022-10-23 DIAGNOSIS — R159 Full incontinence of feces: Secondary | ICD-10-CM | POA: Diagnosis present

## 2022-10-23 DIAGNOSIS — I959 Hypotension, unspecified: Secondary | ICD-10-CM | POA: Diagnosis present

## 2022-10-23 DIAGNOSIS — Z7984 Long term (current) use of oral hypoglycemic drugs: Secondary | ICD-10-CM | POA: Diagnosis not present

## 2022-10-23 DIAGNOSIS — I10 Essential (primary) hypertension: Secondary | ICD-10-CM | POA: Diagnosis present

## 2022-10-23 DIAGNOSIS — Z7982 Long term (current) use of aspirin: Secondary | ICD-10-CM

## 2022-10-23 DIAGNOSIS — I5023 Acute on chronic systolic (congestive) heart failure: Secondary | ICD-10-CM | POA: Diagnosis present

## 2022-10-23 DIAGNOSIS — I110 Hypertensive heart disease with heart failure: Secondary | ICD-10-CM | POA: Diagnosis present

## 2022-10-23 DIAGNOSIS — R197 Diarrhea, unspecified: Secondary | ICD-10-CM | POA: Diagnosis present

## 2022-10-23 DIAGNOSIS — R059 Cough, unspecified: Secondary | ICD-10-CM | POA: Diagnosis present

## 2022-10-23 LAB — URINALYSIS, ROUTINE W REFLEX MICROSCOPIC
Bilirubin Urine: NEGATIVE
Glucose, UA: NEGATIVE mg/dL
Hgb urine dipstick: NEGATIVE
Ketones, ur: NEGATIVE mg/dL
Leukocytes,Ua: NEGATIVE
Nitrite: NEGATIVE
Protein, ur: NEGATIVE mg/dL
Specific Gravity, Urine: 1.011 (ref 1.005–1.030)
pH: 5 (ref 5.0–8.0)

## 2022-10-23 LAB — COMPREHENSIVE METABOLIC PANEL
ALT: 9 U/L (ref 0–44)
AST: 19 U/L (ref 15–41)
Albumin: 3.2 g/dL — ABNORMAL LOW (ref 3.5–5.0)
Alkaline Phosphatase: 87 U/L (ref 38–126)
Anion gap: 8 (ref 5–15)
BUN: 19 mg/dL (ref 8–23)
CO2: 26 mmol/L (ref 22–32)
Calcium: 8.6 mg/dL — ABNORMAL LOW (ref 8.9–10.3)
Chloride: 103 mmol/L (ref 98–111)
Creatinine, Ser: 1.32 mg/dL — ABNORMAL HIGH (ref 0.61–1.24)
GFR, Estimated: 55 mL/min — ABNORMAL LOW (ref >60)
Glucose, Bld: 181 mg/dL — ABNORMAL HIGH (ref 70–99)
Potassium: 4.2 mmol/L (ref 3.5–5.1)
Sodium: 137 mmol/L (ref 135–145)
Total Bilirubin: 1.3 mg/dL — ABNORMAL HIGH (ref 0.3–1.2)
Total Protein: 7.3 g/dL (ref 6.5–8.1)

## 2022-10-23 LAB — CBC
HCT: 41.4 % (ref 39.0–52.0)
Hemoglobin: 12.8 g/dL — ABNORMAL LOW (ref 13.0–17.0)
MCH: 25.8 pg — ABNORMAL LOW (ref 26.0–34.0)
MCHC: 30.9 g/dL (ref 30.0–36.0)
MCV: 83.3 fL (ref 80.0–100.0)
Platelets: 316 10*3/uL (ref 150–400)
RBC: 4.97 MIL/uL (ref 4.22–5.81)
RDW: 15.1 % (ref 11.5–15.5)
WBC: 8.4 10*3/uL (ref 4.0–10.5)
nRBC: 0 % (ref 0.0–0.2)

## 2022-10-23 LAB — RESP PANEL BY RT-PCR (RSV, FLU A&B, COVID)  RVPGX2
Influenza A by PCR: NEGATIVE
Influenza B by PCR: NEGATIVE
Resp Syncytial Virus by PCR: NEGATIVE
SARS Coronavirus 2 by RT PCR: NEGATIVE

## 2022-10-23 LAB — BRAIN NATRIURETIC PEPTIDE: B Natriuretic Peptide: 1438.6 pg/mL — ABNORMAL HIGH (ref 0.0–100.0)

## 2022-10-23 LAB — CBG MONITORING, ED: Glucose-Capillary: 111 mg/dL — ABNORMAL HIGH (ref 70–99)

## 2022-10-23 MED ORDER — HYDRALAZINE HCL 10 MG PO TABS
10.0000 mg | ORAL_TABLET | Freq: Three times a day (TID) | ORAL | Status: DC
  Administered 2022-10-23 – 2022-10-24 (×2): 10 mg via ORAL
  Filled 2022-10-23 (×2): qty 1

## 2022-10-23 MED ORDER — FUROSEMIDE 10 MG/ML IJ SOLN
40.0000 mg | Freq: Once | INTRAMUSCULAR | Status: AC
  Administered 2022-10-23: 40 mg via INTRAVENOUS
  Filled 2022-10-23: qty 4

## 2022-10-23 MED ORDER — INSULIN ASPART 100 UNIT/ML IJ SOLN
0.0000 [IU] | Freq: Three times a day (TID) | INTRAMUSCULAR | Status: DC
  Administered 2022-10-24 (×2): 1 [IU] via SUBCUTANEOUS
  Administered 2022-10-24 – 2022-10-26 (×5): 2 [IU] via SUBCUTANEOUS

## 2022-10-23 MED ORDER — ACETAMINOPHEN 650 MG RE SUPP: 650.0000 mg | Freq: Four times a day (QID) | RECTAL | Status: DC | PRN

## 2022-10-23 MED ORDER — BUPROPION HCL ER (XL) 150 MG PO TB24
300.0000 mg | ORAL_TABLET | Freq: Every morning | ORAL | Status: DC
  Administered 2022-10-24 – 2022-10-26 (×3): 300 mg via ORAL
  Filled 2022-10-23: qty 2
  Filled 2022-10-23: qty 1
  Filled 2022-10-23: qty 2

## 2022-10-23 MED ORDER — ROSUVASTATIN CALCIUM 20 MG PO TABS
40.0000 mg | ORAL_TABLET | Freq: Every day | ORAL | Status: DC
  Administered 2022-10-24 – 2022-10-26 (×3): 40 mg via ORAL
  Filled 2022-10-23 (×3): qty 2

## 2022-10-23 MED ORDER — METOPROLOL SUCCINATE ER 25 MG PO TB24
50.0000 mg | ORAL_TABLET | Freq: Every day | ORAL | Status: DC
  Administered 2022-10-23 – 2022-10-24 (×2): 50 mg via ORAL
  Filled 2022-10-23 (×2): qty 2

## 2022-10-23 MED ORDER — MONTELUKAST SODIUM 10 MG PO TABS
10.0000 mg | ORAL_TABLET | Freq: Every morning | ORAL | Status: DC
  Administered 2022-10-24 – 2022-10-26 (×3): 10 mg via ORAL
  Filled 2022-10-23 (×3): qty 1

## 2022-10-23 MED ORDER — PANTOPRAZOLE SODIUM 40 MG PO TBEC
40.0000 mg | DELAYED_RELEASE_TABLET | Freq: Every morning | ORAL | Status: DC
  Administered 2022-10-24 – 2022-10-26 (×3): 40 mg via ORAL
  Filled 2022-10-23 (×3): qty 1

## 2022-10-23 MED ORDER — SACUBITRIL-VALSARTAN 24-26 MG PO TABS
1.0000 | ORAL_TABLET | Freq: Two times a day (BID) | ORAL | Status: DC
  Administered 2022-10-23 – 2022-10-24 (×2): 1 via ORAL
  Filled 2022-10-23 (×2): qty 1

## 2022-10-23 MED ORDER — ENOXAPARIN SODIUM 60 MG/0.6ML IJ SOSY
60.0000 mg | PREFILLED_SYRINGE | INTRAMUSCULAR | Status: DC
  Administered 2022-10-24 – 2022-10-26 (×3): 60 mg via SUBCUTANEOUS
  Filled 2022-10-23 (×3): qty 0.6

## 2022-10-23 MED ORDER — ACETAMINOPHEN 325 MG PO TABS: 650.0000 mg | ORAL_TABLET | Freq: Four times a day (QID) | ORAL | Status: DC | PRN

## 2022-10-23 MED ORDER — ASPIRIN 81 MG PO TBEC
81.0000 mg | DELAYED_RELEASE_TABLET | Freq: Every day | ORAL | Status: DC
  Administered 2022-10-24 – 2022-10-26 (×3): 81 mg via ORAL
  Filled 2022-10-23 (×3): qty 1

## 2022-10-23 NOTE — Assessment & Plan Note (Addendum)
T2DM on Metformin and Glimepiride at home. Last A1c 1 year ago 6.5.  - CBG checks ACHS - Sensitive SSI - A1c pending

## 2022-10-23 NOTE — Hospital Course (Addendum)
Karl Peters is a 82 y.o.male with a history of HFrEF (30% EF), CAD, T2DM, HLD, HTN  who was admitted to the [facility] Medicine Teaching Service at [facility] for CHF exacerbation. His hospital course is detailed below:  CHF Exacerbation Over the past 3 weeks patient has not been compliant with his medications due to nausea and vomiting with taking his meds all at once. Presents with shortness of breath, cough, bilateral lower extremity edema, orthopnea, and PND.  Diuresed with IV Lasix in the ED.  Transition back to PO Lasix ***.  Continued Entresto and Metoprolol Succinate, referred additional GDMT due to history of intolerance and elevated Cr and K in the past.  T2DM Last A1c 6.5 one year ago. Managed on Metformin and Glimepiride at home. Started sensitive SSI during hospitalization with CBG checks. A1c ***.   Pleural Effusion R sided pleural effusion present on CXR. Consider XR outpatient to monitor pleural effusion vs consolidation.   Other chronic conditions were medically managed with home medications and formulary alternatives as necessary: HTN: continued Hydralazine 10 mg TID and Toprol-XL 50 mg daily HLD: continued home Crestor CAD: continued ASA  PCP Follow-up Recommendations: Repeat CXR to monitor pleural effusion/consolidation.

## 2022-10-23 NOTE — ED Provider Notes (Signed)
82 year old male history of congestive heart failure ejection fraction of around 30% presents with 3 weeks of nausea, he has had a dry cough and progressive edema and shortness of breath and last night with severe orthopnea.  He has 2+ symmetrical pitting edema, he is not tachycardic or hypoxic, he is mildly tachypneic, he does have subtle rales at the lungs which do clear with deep breathing.  He has a nontender abdomen, clear oropharynx and nasal passages.  I suspect the patient is fluid overloaded, he has been nauseated for several weeks which she states occurs after he takes all of his morning medications, he has not been nauseated today.  He states he is making plenty of urine and trying to drink lots of liquids but sometimes does not eat very much.  Chest x-ray with some edema, labs show stable renal function, no leukocytosis, anticipate admission for diuresis, he will be given IV Lasix in the ER, he states he is about 20 pounds heavier than the last time he was admitted to the hospital but is unsure about how quick that occurred  Required IV diuresis  CRITICAL CARE Performed by: Vida Roller Total critical care time: 35 minutes Critical care time was exclusive of separately billable procedures and treating other patients. Critical care was necessary to treat or prevent imminent or life-threatening deterioration. Critical care was time spent personally by me on the following activities: development of treatment plan with patient and/or surrogate as well as nursing, discussions with consultants, evaluation of patient's response to treatment, examination of patient, obtaining history from patient or surrogate, ordering and performing treatments and interventions, ordering and review of laboratory studies, ordering and review of radiographic studies, pulse oximetry and re-evaluation of patient's condition.    I discussed the with the family medicine resident team, they will come to admit the  patient to the hospital   Eber Hong, MD 10/25/22 1211

## 2022-10-23 NOTE — H&P (Cosign Needed Addendum)
Hospital Admission History and Physical Service Pager: [phone]  Patient name: Karl Peters Medical record number: [id number] Date of Birth: 01-27-1941 Age: 82 y.o. Gender: male  Primary Care Provider: Eartha Inch, MD Consultants: None Code Status: Full code  Preferred Emergency Contact:  Contact Information     Name Relation Home Work Mobile   Lessley,Jeanette Spouse [phone]        Other Contacts   None on File      Chief Complaint: SOB, orthopnea  Assessment and Plan: Karl Peters is a 82 y.o. male with past medical history of HFrEF (30% EF), CAD, T2DM, HLD, HTN presenting with SOB, dry cough, edema, and worsened orthopnea last night. Differential for presentation of this includes CHF exacerbation 2/2 to missed Lasix doses (most likely), PE, URI (no fever/chills), GI illness (nausea/vomiting have resolved), malignancy (R pleural effusion on CXR).  Assessment & Plan Acute congestive heart failure, unspecified heart failure type (HCC) Noncompliant with home Lasix for the last 3 weeks d/t nausea and vomiting with taking his medications all at once. Wt is up ~7 kgs since d/c of last hospitalization in 03/2021. Last Echo 03/2021 LVEF 25-30%, mild concentric left ventricular hypertrophy, L atrial dilation, borderline dilation of ascending aorta. No current chest pain, but slight discomfort from coughing, deferred trops. SPO2 in mid-high 90's on RA but unable to speak in complete sentences.  - Admit to FMTS, attending Dr. Pollie Meyer - Med-Tele, Vital signs per floor - Heart healthy diet  - PT/OT to treat - VTE prophylaxis: Lovenox - Fluid restriction:1200 mL - AM CBC/BMP - Strict I/Os - Daily weights - s/p lasix 40 IV in ED, re dose Lasix 40 mg this evening - Continue Entresto and Metoprolol Succinate. Additional GDMT considered, however hx of intolerance and elevated Cr and K in the past Primary hypertension 150s-170/low 100s. Takes Hydralazine 10 mg TID and  Toprol-XL 50 mg daily. - Restart home Hydralazine 10 mg TID and Toprol-XL 50 mg daily Type 2 diabetes mellitus without complication, without long-term current use of insulin (HCC) T2DM on Metformin and Glimepiride at home. Last A1c 1 year ago 6.5.  - CBG checks ACHS - Sensitive SSI - A1c pending Pleural effusion R sided effusion could be d/t CHF exacerbation and causing SOB. Cannot r/o malignancy, infection. Needs to be monitored.  -repeat CXR out patient or sooner if indicated  Chronic and Stable Problems: HLD: continue home Crestor CAD: continue ASA  FEN/GI: Heart healthy diet VTE Prophylaxis: Lovenox  Disposition: Med-Tele  History of Present Illness:  Karl Peters is a 82 y.o. male presenting with his daughter for shortness of breath over the past 2 weeks especially with ambulation.  SOB is "improving".  Probably takes his Lasix daily at home however has not been able to take it for the past 2 weeks due to being episodes of vomiting. Stopped taking all of his medications 2 weeks ago after he started feeling bad 3 weeks ago which he associates with taking his medications all at once.   He last felt nauseous about 2 weeks ago. He admits to decreased appetite, but reports that his nausea and vomiting has resolved. Per EDP, patient has gained about 20 pounds since the last time he was admitted to the hospital in 03/2021, but unsure how quick that occurred.   States he last saw cardiology months ago, unsure when.  On chart review, last office visit was August 2023.  Has a nonproductive cough, worse at night, for  the past 2 weeks but no fever. Admits to PND and orthopnea.  Admits to chest discomfort with cough but denies pain or radiation. Denies palpitations, abdominal pain, nausea, vomiting currently, reports this only happens when he takes his medications all at once daily. Admits to diarrhea occassionally but this is a chronic issue.   Daughter also reports that patient is incontinent  of both urine and feces.  Reports that she feels like he has been more incontinent recently.  Bowel movements are occasionally loose and yellow and red in color.  In the ED, patient presents short of breath and unable to speak in full sentences without difficulty.  Fluid overloaded with 2+ pitting edema in lower extremities bilaterally.  BMP demonstrated creatinine 1.32 (baseline 1.5-2.5 over the last year).  BNP 1438.  Lasix 40 mg IV given. UA performed due to urinary incontinence to rule out infection but negative for UTI.  CXR significant for consolidates in the right lower lung.  No concern for infection at this time due to patient being afebrile, no infectious complaints, negative UA, no sick contacts. PERC score 1 d/t sx d/t age.   Review Of Systems: Per HPI with the following additions: as above  Pertinent Past Medical History: HFrEF (30% EF) CAD T2DM HLD HTN Ventricular Tachycardia Remainder reviewed in history tab.   Pertinent Past Surgical History: EGD Left Heart Cath and Coronary Angiography  Remainder reviewed in history tab.   Pertinent Social History: Tobacco use: No Alcohol use: very occasionally  Other Substance use: No Lives with wife and daughter   Pertinent Family History: Mother: HF, Lymphoma, DM Father: Heart attack Maternal Grandmother: T2DM Paternal Grandfather: Prostate Cancer Remainder reviewed in history tab.   Important Outpatient Medications: ASA 81 mg daily Wellbutrin 300 mg daily Lasix 40 mg - 1 tab on M/W/F and 1/2 tab on Tu/Th. None on S/S Glimepiride 4 mg daily Hydralazine 10 mg TID Metformin 1000 mg daily Toprol-XL 50 mg daily Montelukast 10 mg dailiy Protonix 40 mg daily Crestor 40 mg daily Entresto 24-26 mg BID Remainder reviewed in medication history.   Objective: BP (!) 172/98   Pulse (!) 102   Temp 98.2 F (36.8 C) (Oral)   Resp 18   Ht 5' 8.5" (1.74 m)   Wt 117.9 kg   SpO2 96%   BMI 38.95 kg/m  Exam: General:  Chronically ill male, awake and alert in mild discomfort HEENT: Normocephalic, atraumatic.  EOMI.  Neck: No JVD Cardiovascular: Tachycardic in NSR. No M/R/G. 2+ radial pulses. Warm and well perfused. Respiratory: CTAB. No crackles, wheezing, or rhonchi. Gastrointestinal: Soft, non-tender, non-distended. Normoactive bowel sounds.  MSK: 2+ BLE edema. Derm: Dry and warm Neuro: AAOx3. No focal neurological deficits. Psych: Normal mood and affect.  Labs:  CBC BMET  Recent Labs  Lab 10/23/22 1320  WBC 8.4  HGB 12.8*  HCT 41.4  PLT 316   Recent Labs  Lab 10/23/22 1320  NA 137  K 4.2  CL 103  CO2 26  BUN 19  CREATININE 1.32*  GLUCOSE 181*  CALCIUM 8.6*    BNP: 1438.6  EKG: regular rate, NSR with prolonged PR interval, left axis deviation, RBBB  Imaging Studies Performed: CXR Heterogeneous opacities overlying the right lower lung zone, concerning for combination of consolidation and pleural effusion. Follow-up to clearing is recommended.   Fortunato Curling, DO 10/23/2022, 10:17 PM PGY-1, [facility]  FPTS Intern pager: [phone], text pages welcome Secure chat group [facility] Teaching Service  I was personally present and re-performed the exam and medical decision making and verified the service and findings are accurately documented in the intern's note.  Erick Alley, DO 10/23/2022 11:54 PM

## 2022-10-23 NOTE — ED Triage Notes (Signed)
Pt c/o SOB and dry coughx2wks. Pt denies any other sx.

## 2022-10-23 NOTE — ED Provider Notes (Signed)
[place] EMERGENCY DEPARTMENT AT [facility] Provider Note   CSN: [id number] Arrival date & time: 10/23/22  1304     History  Chief Complaint  Patient presents with   Shortness of Breath    Karl Peters is a 82 y.o. 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  He reports that shortness of breath is actually "improving".  He reports that he has been vomiting over the past 2 weeks following taking medication all at once and has been unable to take his Lasix for the past 2 weeks.  Normally takes his Lasix daily at home.  He reports he has had a nonproductive cough over the past 2 weeks but no fever.  Currently he denies chest pain, shortness of breath, abdominal pain.  Of note, daughter reports that patient is incontinent of both urine and feces.  She reports that he seems to be more incontinent of both recently.  He describes his bowel movements to be occasionally loose with yellow and red in color.   Shortness of Breath Associated symptoms: cough and vomiting        Home Medications Prior to Admission medications   Medication Sig Start Date End Date Taking? Authorizing Provider  aspirin EC 81 MG EC tablet Take 1 tablet (81 mg total) by mouth daily. Swallow whole. 04/13/21   Corrin Parker, PA-C  buPROPion (WELLBUTRIN XL) 300 MG 24 hr tablet Take 300 mg by mouth every morning.    [provider]  furosemide (LASIX) 40 MG tablet Take 1/2 tablet by mouth on Mondays, Wednesdays, Fridays, take 1 tablet by mouth on Tuesdays, Thursdays, Saturdays, & Sundays 05/07/21   Gaston Islam., NP  glimepiride (AMARYL) 4 MG tablet Take 4 mg by mouth every morning. 11/22/17   [provider]  hydrALAZINE (APRESOLINE) 10 MG tablet Take 1 tablet (10 mg total) by mouth 3  (three) times daily. 05/07/21   Gaston Islam., NP  metFORMIN (GLUCOPHAGE) 500 MG tablet Take 1,000 mg by mouth every morning.    [provider]  metoprolol succinate (TOPROL-XL) 50 MG 24 hr tablet Take 1 tablet (50 mg total) by mouth daily. Take with or immediately following a meal. 10/15/20   Zigmund Daniel., MD  montelukast (SINGULAIR) 10 MG tablet Take 10 mg by mouth every morning. 05/16/17   [provider]  pantoprazole (PROTONIX) 40 MG tablet Take 40 mg by mouth every morning.    [provider]  rosuvastatin (CRESTOR) 40 MG tablet Take 40 mg by mouth daily. 04/04/21   [provider]  sacubitril-valsartan (ENTRESTO) 24-26 MG Take 1 tablet by mouth 2 (two) times daily. 09/17/21   Christell Constant, MD      Allergies    Jardiance [empagliflozin]    Review of Systems   Review of Systems  Respiratory:  Positive for cough.   Gastrointestinal:  Positive for vomiting.    Physical Exam Updated Vital Signs BP (!) 175/102   Pulse 95   Temp 97.9 F (36.6 C) (Oral)   Resp (!) 22   Ht 5' 8.5" (1.74 m)   Wt 117.9 kg   SpO2 99%   BMI 38.95 kg/m  Physical Exam Vitals and nursing note reviewed.  Constitutional:      General: He is not in acute  distress.    Appearance: He is well-developed.  HENT:     Head: Normocephalic and atraumatic.  Eyes:     Conjunctiva/sclera: Conjunctivae normal.  Cardiovascular:     Rate and Rhythm: Normal rate and regular rhythm.     Heart sounds: No murmur heard. Pulmonary:     Effort: Pulmonary effort is normal. No respiratory distress.     Breath sounds: Normal breath sounds.  Abdominal:     Palpations: Abdomen is soft.     Tenderness: There is no abdominal tenderness. There is no guarding or rebound.  Musculoskeletal:        General: No swelling.     Cervical back: Neck supple.     Right lower leg: Edema present.     Left lower leg: Edema present.     Comments: 2+ pitting edema bilaterally   Skin:    General: Skin is warm and dry.     Capillary Refill: Capillary refill takes less than 2 seconds.  Neurological:     Mental Status: He is alert.  Psychiatric:        Mood and Affect: Mood normal.     ED Results / Procedures / Treatments   Labs (all labs ordered are listed, but only abnormal results are displayed) Labs Reviewed  COMPREHENSIVE METABOLIC PANEL - Abnormal; Notable for the following components:      Result Value   Glucose, Bld 181 (*)    Creatinine, Ser 1.32 (*)    Calcium 8.6 (*)    Albumin 3.2 (*)    Total Bilirubin 1.3 (*)    GFR, Estimated 55 (*)    All other components within normal limits  CBC - Abnormal; Notable for the following components:   Hemoglobin 12.8 (*)    MCH 25.8 (*)    All other components within normal limits  BRAIN NATRIURETIC PEPTIDE - Abnormal; Notable for the following components:   B Natriuretic Peptide 1,438.6 (*)    All other components within normal limits  RESP PANEL BY RT-PCR (RSV, FLU A&B, COVID)  [id number]  URINALYSIS, ROUTINE W REFLEX MICROSCOPIC    EKG None  Radiology DG Chest 1 View  Result Date: 10/23/2022 CLINICAL DATA:  Shortness of breath. EXAM: CHEST  1 VIEW COMPARISON:  04/10/2021. FINDINGS: There are heterogeneous opacities overlying the right lower lung zone, concerning for combination of consolidation and pleural effusion. Follow-up to clearing is recommended. Redemonstration of elevated right hemidiaphragm. Bilateral lungs are otherwise clear. Left lateral costophrenic angle is clear. Normal cardio-mediastinal silhouette. No acute osseous abnormalities. The soft tissues are within normal limits. IMPRESSION: 1. Heterogeneous opacities overlying the right lower lung zone, concerning for combination of consolidation and pleural effusion. Follow-up to clearing is recommended. Electronically Signed   By: Jules Schick M.D.   On: 10/23/2022 16:57    Procedures Procedures    Medications Ordered in ED Medications   furosemide (LASIX) injection 40 mg (40 mg Intravenous Given 10/23/22 1523)    ED Course/ Medical Decision Making/ A&P                                 Medical Decision Making Amount and/or Complexity of Data Reviewed Labs: ordered.   Upon assessment, patient is resting comfortably in bed.  While asking questions patient is clearly short of breath unable to make full and complete sentences without difficulty.  He physically appears fluid overloaded with 2+ pitting edema in lower extremities  bilaterally is likely due to being unable to take his Lasix for the past 2 weeks. Will obtain BNP and an chest x-ray to see fluid status. Creatinine 1.32 (Baseline 1.5-2.5 over last year) Lasix 40 mg IV.  Following Lasix will ambulate patient to assess shortness of breath, oxygen saturation but plan to admit patient for diuresis.  At this time, he denies nausea, vomiting but reports this typically happens with taking his medication all at once daily.  He denies abdominal pain and no tenderness noted upon exam.  Due to being incontinent of urine will obtain UA to rule out infection but is negative for UTI.  Chest x-ray significant for consolidates in the right lower lung. BNP 1438. No concern for infection at this time as no fever during ED visit, no infectious complaints, negative UA, no known sick contacts.  PERC score 1 due to age with no recent immobilization, chest pain, recent surgery, unilateral swelling, tachycardia, hypoxia at rest..  1920 on reassessment patient reports improvement to shortness of breath.  He is able to speak in full and complete sentences with limited difficulty.  However upon ambulation he is still having dyspnea.  So will admit for shortness of breath and diuresis.  Dr. Hyacinth Meeker agrees with treatment plan and consults Family Medicine for admission who accepts patient.        Final Clinical Impression(s) / ED Diagnoses Final diagnoses:  SOB (shortness of breath)  Acute  congestive heart failure, unspecified heart failure type [facility])  Primary hypertension    Rx / DC Orders ED Discharge Orders     None         Judithann Sheen, PA 10/23/22 2007    Eber Hong, MD 10/25/22 [phone]

## 2022-10-24 ENCOUNTER — Inpatient Hospital Stay (HOSPITAL_COMMUNITY): Payer: Medicare Other

## 2022-10-24 DIAGNOSIS — I509 Heart failure, unspecified: Secondary | ICD-10-CM

## 2022-10-24 LAB — BASIC METABOLIC PANEL
Anion gap: 13 (ref 5–15)
BUN: 20 mg/dL (ref 8–23)
CO2: 27 mmol/L (ref 22–32)
Calcium: 9 mg/dL (ref 8.9–10.3)
Chloride: 96 mmol/L — ABNORMAL LOW (ref 98–111)
Creatinine, Ser: 1.39 mg/dL — ABNORMAL HIGH (ref 0.61–1.24)
GFR, Estimated: 51 mL/min — ABNORMAL LOW (ref >60)
Glucose, Bld: 169 mg/dL — ABNORMAL HIGH (ref 70–99)
Potassium: 4 mmol/L (ref 3.5–5.1)
Sodium: 136 mmol/L (ref 135–145)

## 2022-10-24 LAB — CBC
HCT: 45.4 % (ref 39.0–52.0)
Hemoglobin: 13.8 g/dL (ref 13.0–17.0)
MCH: 26.4 pg (ref 26.0–34.0)
MCHC: 30.4 g/dL (ref 30.0–36.0)
MCV: 86.8 fL (ref 80.0–100.0)
Platelets: 326 10*3/uL (ref 150–400)
RBC: 5.23 MIL/uL (ref 4.22–5.81)
RDW: 15.2 % (ref 11.5–15.5)
WBC: 9.1 10*3/uL (ref 4.0–10.5)
nRBC: 0 % (ref 0.0–0.2)

## 2022-10-24 LAB — CBG MONITORING, ED
Glucose-Capillary: 135 mg/dL — ABNORMAL HIGH (ref 70–99)
Glucose-Capillary: 159 mg/dL — ABNORMAL HIGH (ref 70–99)

## 2022-10-24 LAB — GLUCOSE, CAPILLARY
Glucose-Capillary: 141 mg/dL — ABNORMAL HIGH (ref 70–99)
Glucose-Capillary: 180 mg/dL — ABNORMAL HIGH (ref 70–99)

## 2022-10-24 LAB — TROPONIN I (HIGH SENSITIVITY)
Troponin I (High Sensitivity): 34 ng/L — ABNORMAL HIGH (ref <18)
Troponin I (High Sensitivity): 31 ng/L — ABNORMAL HIGH (ref <18)
Troponin I (High Sensitivity): 32 ng/L — ABNORMAL HIGH (ref <18)

## 2022-10-24 LAB — HEMOGLOBIN A1C
Hgb A1c MFr Bld: 7.1 % — ABNORMAL HIGH (ref 4.8–5.6)
Mean Plasma Glucose: 157.07 mg/dL

## 2022-10-24 MED ORDER — FUROSEMIDE 10 MG/ML IJ SOLN
40.0000 mg | Freq: Once | INTRAMUSCULAR | Status: AC
  Administered 2022-10-24: 40 mg via INTRAVENOUS
  Filled 2022-10-24: qty 4

## 2022-10-24 NOTE — Assessment & Plan Note (Addendum)
BG improved from 181 to 111. A1c 7.1, increased from 6.5. - Sensitive SSI  - CBG checks ACHS

## 2022-10-24 NOTE — ED Notes (Signed)
ED TO INPATIENT HANDOFF REPORT  ED Nurse Name and Phone #: Percival Spanish [phone]  S Name/Age/Gender Karl Peters 82 y.o. male Room/Bed: 003C/003C  Code Status   Code Status: Full Code  Home/SNF/Other Home Patient oriented to: self, place, time, and situation Is this baseline? Yes   Triage Complete: Triage complete  Chief Complaint Acute exacerbation of CHF (congestive heart failure) (HCC) [I50.9]  Triage Note Pt c/o SOB and dry coughx2wks. Pt denies any other sx.   Allergies Allergies  Allergen Reactions   Jardiance [Empagliflozin] Nausea Only    Level of Care/Admitting Diagnosis ED Disposition     ED Disposition  Admit   Condition  --   Comment  Hospital Area: [facility] [[id number]]  Level of Care: Telemetry Medical [104]  May admit patient to [facility] or [facility] if equivalent level of care is available:: No  Covid Evaluation: Confirmed COVID Negative  Diagnosis: Acute exacerbation of CHF (congestive heart failure) [facility]) [[id number]]  Admitting Physician: Fortunato Curling [[id number]]  Attending Physician: Latrelle Dodrill [id number]  Certification:: I certify this patient will need inpatient services for at least 2 midnights  Expected Medical Readiness: 10/25/2022          B Medical/Surgery History Past Medical History:  Diagnosis Date   Acute on chronic diastolic CHF (congestive heart failure), NYHA class 3 (HCC) 04/10/2021   Diabetes mellitus    Dyslipidemia    Hx of cardiac catheterization    a. normal cors by cath in 1998 and 2010   Hx of echocardiogram    Echocardiogram 06/10/12: Normal LV wall thickness, EF 50-55%, grade 1 diastolic dysfunction, mild LAE   Hypertension    Obesity    Ventricular tachycardia (HCC)    and symptomatic PVCs - Rx with beta blocker   Past Surgical History:  Procedure Laterality Date   ESOPHAGOGASTRODUODENOSCOPY  06/18/2003   negative   LEFT HEART CATH  12/27/2015   Procedure: Left Heart Catheterization W  Intervention; Surgeon: Arizona Constable, MD; Location: [facility] CATH; Service: Cardiology     LEFT HEART CATH AND CORONARY ANGIOGRAPHY N/A 10/10/2020   Procedure: LEFT HEART CATH AND CORONARY ANGIOGRAPHY;  Surgeon: Lyn Records, MD;  Location: [facility] INVASIVE CV LAB;  Service: Cardiovascular;  Laterality: N/A;     A IV Location/Drains/Wounds Patient Lines/Drains/Airways Status     Active Line/Drains/Airways     Name Placement date Placement time Site Days   Peripheral IV 10/23/22 20 G Right Antecubital 10/23/22  1453  Antecubital  1            Intake/Output Last 24 hours  Intake/Output Summary (Last 24 hours) at 10/24/2022 1159 Last data filed at 10/24/2022 0347 Gross per 24 hour  Intake --  Output 875 ml  Net -875 ml    Labs/Imaging Results for orders placed or performed during the hospital encounter of 10/23/22 (from the past 48 hour(s))  Comprehensive metabolic panel     Status: Abnormal   Collection Time: 10/23/22  1:20 PM  Result Value Ref Range   Sodium 137 135 - 145 mmol/L   Potassium 4.2 3.5 - 5.1 mmol/L    Comment: HEMOLYSIS AT THIS LEVEL MAY AFFECT RESULT   Chloride 103 98 - 111 mmol/L   CO2 26 22 - 32 mmol/L   Glucose, Bld 181 (H) 70 - 99 mg/dL    Comment: Glucose reference range applies only to samples taken after fasting for at least 8 hours.   BUN 19 8 -  23 mg/dL   Creatinine, Ser 5.28 (H) 0.61 - 1.24 mg/dL   Calcium 8.6 (L) 8.9 - 10.3 mg/dL   Total Protein 7.3 6.5 - 8.1 g/dL   Albumin 3.2 (L) 3.5 - 5.0 g/dL   AST 19 15 - 41 U/L    Comment: HEMOLYSIS AT THIS LEVEL MAY AFFECT RESULT   ALT 9 0 - 44 U/L    Comment: HEMOLYSIS AT THIS LEVEL MAY AFFECT RESULT   Alkaline Phosphatase 87 38 - 126 U/L   Total Bilirubin 1.3 (H) 0.3 - 1.2 mg/dL    Comment: HEMOLYSIS AT THIS LEVEL MAY AFFECT RESULT   GFR, Estimated 55 (L) >60 mL/min    Comment: (NOTE) Calculated using the CKD-EPI Creatinine Equation (2021)    Anion gap 8 5 - 15    Comment: Performed at [facility], [address], [place], [place] [address]  CBC     Status: Abnormal   Collection Time: 10/23/22  1:20 PM  Result Value Ref Range   WBC 8.4 4.0 - 10.5 K/uL   RBC 4.97 4.22 - 5.81 MIL/uL   Hemoglobin 12.8 (L) 13.0 - 17.0 g/dL   HCT 40.1 02.7 - 25.3 %   MCV 83.3 80.0 - 100.0 fL   MCH 25.8 (L) 26.0 - 34.0 pg   MCHC 30.9 30.0 - 36.0 g/dL   RDW 66.4 40.3 - 47.4 %   Platelets 316 150 - 400 K/uL   nRBC 0.0 0.0 - 0.2 %    Comment: Performed at [facility], [address], [place], [place] [address]  Brain natriuretic peptide     Status: Abnormal   Collection Time: 10/23/22  1:32 PM  Result Value Ref Range   B Natriuretic Peptide 1,438.6 (H) 0.0 - 100.0 pg/mL    Comment: Performed at [facility], [address] [place], [place] [address]  Resp panel by RT-PCR (RSV, Flu A&B, Covid) Anterior Nasal Swab     Status: None   Collection Time: 10/23/22  2:37 PM   Specimen: Anterior Nasal Swab  Result Value Ref Range   SARS Coronavirus 2 by RT PCR NEGATIVE NEGATIVE   Influenza A by PCR NEGATIVE NEGATIVE   Influenza B by PCR NEGATIVE NEGATIVE    Comment: (NOTE) The Xpert Xpress SARS-CoV-2/FLU/RSV plus assay is intended as an aid in the diagnosis of influenza from Nasopharyngeal swab specimens and should not be used as a sole basis for treatment. Nasal washings and aspirates are unacceptable for Xpert Xpress SARS-CoV-2/FLU/RSV testing.  Fact Sheet for Patients: BloggerCourse.com  Fact Sheet for Healthcare Providers: SeriousBroker.it  This test is not yet approved or cleared by the [place] FDA and has been authorized for detection and/or diagnosis of SARS-CoV-2 by FDA under an Emergency Use Authorization (EUA). This EUA will remain in effect (meaning this test can be used) for the duration of the COVID-19 declaration under Section 564(b)(1) of the Act, 21 U.S.C. section 360bbb-3(b)(1), unless the authorization  is terminated or revoked.     Resp Syncytial Virus by PCR NEGATIVE NEGATIVE    Comment: (NOTE) Fact Sheet for Patients: BloggerCourse.com  Fact Sheet for Healthcare Providers: SeriousBroker.it  This test is not yet approved or cleared by the [place] FDA and has been authorized for detection and/or diagnosis of SARS-CoV-2 by FDA under an Emergency Use Authorization (EUA). This EUA will remain in effect (meaning this test can be used) for the duration of the COVID-19 declaration under Section 564(b)(1) of  the Act, 21 U.S.C. section 360bbb-3(b)(1), unless the authorization is terminated or revoked.  Performed at [facility], [address], [place], [place] [address]   Urinalysis, Routine w reflex microscopic -Urine, Clean Catch     Status: None   Collection Time: 10/23/22  3:19 PM  Result Value Ref Range   Color, Urine YELLOW YELLOW   APPearance CLEAR CLEAR   Specific Gravity, Urine 1.011 1.005 - 1.030   pH 5.0 5.0 - 8.0   Glucose, UA NEGATIVE NEGATIVE mg/dL   Hgb urine dipstick NEGATIVE NEGATIVE   Bilirubin Urine NEGATIVE NEGATIVE   Ketones, ur NEGATIVE NEGATIVE mg/dL   Protein, ur NEGATIVE NEGATIVE mg/dL   Nitrite NEGATIVE NEGATIVE   Leukocytes,Ua NEGATIVE NEGATIVE    Comment: Performed at [facility], [address], [place], [place] [address]  CBG monitoring, ED     Status: Abnormal   Collection Time: 10/23/22 11:09 PM  Result Value Ref Range   Glucose-Capillary 111 (H) 70 - 99 mg/dL    Comment: Glucose reference range applies only to samples taken after fasting for at least 8 hours.  Basic metabolic panel     Status: Abnormal   Collection Time: 10/24/22  3:42 AM  Result Value Ref Range   Sodium 136 135 - 145 mmol/L   Potassium 4.0 3.5 - 5.1 mmol/L   Chloride 96 (L) 98 - 111 mmol/L   CO2 27 22 - 32 mmol/L   Glucose, Bld 169 (H) 70 - 99 mg/dL    Comment: Glucose reference range applies only to  samples taken after fasting for at least 8 hours.   BUN 20 8 - 23 mg/dL   Creatinine, Ser 8.29 (H) 0.61 - 1.24 mg/dL   Calcium 9.0 8.9 - 56.2 mg/dL   GFR, Estimated 51 (L) >60 mL/min    Comment: (NOTE) Calculated using the CKD-EPI Creatinine Equation (2021)    Anion gap 13 5 - 15    Comment: Performed at [facility], [address] [place], [place] [address]  CBC     Status: None   Collection Time: 10/24/22  3:42 AM  Result Value Ref Range   WBC 9.1 4.0 - 10.5 K/uL   RBC 5.23 4.22 - 5.81 MIL/uL   Hemoglobin 13.8 13.0 - 17.0 g/dL   HCT 57.8 46.9 - 62.9 %   MCV 86.8 80.0 - 100.0 fL   MCH 26.4 26.0 - 34.0 pg   MCHC 30.4 30.0 - 36.0 g/dL   RDW 52.8 41.3 - 24.4 %   Platelets 326 150 - 400 K/uL   nRBC 0.0 0.0 - 0.2 %    Comment: Performed at [facility], [address] [place], [place] [address]  Hemoglobin A1c     Status: Abnormal   Collection Time: 10/24/22  3:42 AM  Result Value Ref Range   Hgb A1c MFr Bld 7.1 (H) 4.8 - 5.6 %    Comment: (NOTE) Pre diabetes:          5.7%-6.4%  Diabetes:              >6.4%  Glycemic control for   <7.0% adults with diabetes    Mean Plasma Glucose 157.07 mg/dL    Comment: Performed at [facility], [address] [place], [place] [address]  Troponin I (High Sensitivity)     Status: Abnormal   Collection Time: 10/24/22  6:50 AM  Result Value Ref Range   Troponin I (  High Sensitivity) 34 (H) <18 ng/L    Comment: (NOTE) Elevated high sensitivity troponin I (hsTnI) values and significant  changes across serial measurements may suggest ACS but many other  chronic and acute conditions are known to elevate hsTnI results.  Refer to the "Links" section for chest pain algorithms and additional  guidance. Performed at [facility], [address], [place], [place] [address]   CBG monitoring, ED     Status: Abnormal   Collection Time: 10/24/22  7:25 AM  Result Value Ref Range   Glucose-Capillary 135 (H) 70 - 99 mg/dL     Comment: Glucose reference range applies only to samples taken after fasting for at least 8 hours.  CBG monitoring, ED     Status: Abnormal   Collection Time: 10/24/22 11:16 AM  Result Value Ref Range   Glucose-Capillary 159 (H) 70 - 99 mg/dL    Comment: Glucose reference range applies only to samples taken after fasting for at least 8 hours.   DG Chest 1 View  Result Date: 10/23/2022 CLINICAL DATA:  Shortness of breath. EXAM: CHEST  1 VIEW COMPARISON:  04/10/2021. FINDINGS: There are heterogeneous opacities overlying the right lower lung zone, concerning for combination of consolidation and pleural effusion. Follow-up to clearing is recommended. Redemonstration of elevated right hemidiaphragm. Bilateral lungs are otherwise clear. Left lateral costophrenic angle is clear. Normal cardio-mediastinal silhouette. No acute osseous abnormalities. The soft tissues are within normal limits. IMPRESSION: 1. Heterogeneous opacities overlying the right lower lung zone, concerning for combination of consolidation and pleural effusion. Follow-up to clearing is recommended. Electronically Signed   By: Jules Schick M.D.   On: 10/23/2022 16:57    Pending Labs Unresulted Labs (From admission, onward)     Start     Ordered   10/25/22 0500  CBC  Tomorrow morning,   R        10/24/22 0700   10/25/22 0500  Basic metabolic panel  Tomorrow morning,   R        10/24/22 0700   10/25/22 0500  Magnesium  Tomorrow morning,   R        10/24/22 0819            Vitals/Pain Today's Vitals   10/24/22 0929 10/24/22 1000 10/24/22 1103 10/24/22 1110  BP:  (!) 140/89  (!) 142/79  Pulse:  71  73  Resp:  20  20  Temp:   98.4 F (36.9 C)   TempSrc:   Oral   SpO2:  90%  96%  Weight:      Height:      PainSc: 0-No pain       Isolation Precautions No active isolations  Medications Medications  aspirin EC tablet 81 mg (81 mg Oral Given 10/24/22 0920)  hydrALAZINE (APRESOLINE) tablet 10 mg (10 mg Oral Given  10/24/22 0924)  metoprolol succinate (TOPROL-XL) 24 hr tablet 50 mg (50 mg Oral Given 10/24/22 0919)  sacubitril-valsartan (ENTRESTO) 24-26 mg per tablet (1 tablet Oral Given 10/24/22 0921)  buPROPion (WELLBUTRIN XL) 24 hr tablet 300 mg (300 mg Oral Given 10/24/22 0926)  rosuvastatin (CRESTOR) tablet 40 mg (40 mg Oral Given 10/24/22 0920)  pantoprazole (PROTONIX) EC tablet 40 mg (40 mg Oral Given 10/24/22 0921)  montelukast (SINGULAIR) tablet 10 mg (10 mg Oral Given 10/24/22 0919)  enoxaparin (LOVENOX) injection 60 mg (60 mg Subcutaneous Given 10/24/22 0921)  acetaminophen (TYLENOL) tablet 650 mg (has no administration in time range)    Or  acetaminophen (TYLENOL) suppository 650 mg (has no administration in time range)  insulin aspart (novoLOG) injection 0-9 Units (2 Units Subcutaneous Given 10/24/22 1139)  furosemide (LASIX) injection 40 mg (40 mg Intravenous Given 10/23/22 1523)  furosemide (LASIX) injection 40 mg (40 mg Intravenous Given 10/23/22 2312)  furosemide (LASIX) injection 40 mg (40 mg Intravenous Given 10/24/22 0721)    Mobility walks     Focused Assessments Pulmonary Assessment Handoff:  Lung sounds: Bilateral Breath Sounds: Diminished L Breath Sounds: Clear R Breath Sounds: Clear O2 Device: Room Air      R Recommendations: See Admitting Provider Note  Report given to:   Additional Notes:

## 2022-10-24 NOTE — ED Notes (Signed)
Dr Pollie Meyer at pt's bedside.

## 2022-10-24 NOTE — ED Notes (Signed)
Pt called and stated he woke sweating and feeling weak. PT stated, I don't feel right." I did an EKG on pt and had Diplomatic Services operational officer page attending MD.

## 2022-10-24 NOTE — Progress Notes (Signed)
Patient came up via stretcher from ED. Reports he was still feeling lightheaded- prior to coming up patient was diaphoretic and hypotensive. Patient assisted with transfer from stretcher to bed. VS obtained- BP 106/61. Patient informed of improvement in BP. Patient no longer c/o of dizziness- assisted to scale to obtain standing weight.

## 2022-10-24 NOTE — Progress Notes (Signed)
PT Cancellation Note  Patient Details Name: Karl Peters MRN: [id number] DOB: 04-14-1940   Cancelled Treatment:    Reason Eval/Treat Not Completed: Patient's level of consciousness;Other (comment) (pt just fell asleep per daughter and did not sleep at all in ED. requesting pt rest as feel this will result in better pt performance on evaluation. Will follow up at later date/time  as pt able and schedule allows.)   Renaldo Fiddler PT, DPT Acute Rehabilitation Services Office [phone]  10/24/22 4:28 PM

## 2022-10-24 NOTE — Progress Notes (Signed)
Daily Progress Note Intern Pager: [phone]  Patient name: Karl Peters Medical record number: [id number] Date of birth: 1940/08/27 Age: 82 y.o. Gender: male  Primary Care Provider: Eartha Inch, MD Consultants: none Code Status: Full Code  Pt Overview and Major Events to Date:  9/6: Admitted; IV Lasix 40mg  x2  Assessment and Plan: Karl Peters is a 82 y.o. 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 Assessment & Plan Acute congestive heart failure, unspecified heart failure type (HCC) S/p IV Lasix 40 mg x2, with good output, but not documented appropriately. Improving SOB, SpO2 mid-high 90s.  - Redose IV Lasix 40mg , wean to PO Lasix 40 mg as tolerated - Troponin pending - Echo pending - Cardiac monitoring - Daily weight - Strict I/Os - Continue Entresto and Metoprolol Succinate. Additional GDMT considered, however hx of intolerance and elevated Cr and K in the past. Primary hypertension BP improved from 120-140s/70s-80s s/p restarting antihypertensives. - Continue Metoprolol Succinate 50 mg daily and Hydralazine 10 mg TID Type 2 diabetes mellitus without complication, without long-term current use of insulin (HCC) BG improved from 181 to 111. A1c 7.1, increased from 6.5. - Sensitive SSI  - CBG checks ACHS Pleural effusion R sided pleural effusion likely secondary to CHF exacerbation and causing SOB. Will continue to monitor symptoms. - Repeat CXR outpatient to monitor effusion vs consolidation, can't rule out malignancy   Chronic and Stable Problems:  HLD: continue Crestor CAD: continue ASA  FEN/GI: Heart healthy diet PPx: Lovenox Dispo: home pending clinical improvement  Subjective:  Patient is doing better this morning. Reports improved breathing and decreased episodes of coughing, but still persists.  Reports that he has not been able to sleep due to inability to lay flat, due to  orthopnea.  Objective: Temp:  [97.5 F (36.4 C)-98.3 F (36.8 C)] 98.3 F (36.8 C) (09/07 0308) Pulse Rate:  [75-108] 80 (09/07 0500) Resp:  [17-27] 20 (09/07 0500) BP: (114-184)/(68-103) 145/87 (09/07 0500) SpO2:  [89 %-99 %] 96 % (09/07 0500) Weight:  [117.9 kg] 117.9 kg (09/06 1310) Physical Exam: General: Chronically ill male, awake and alert in NAD Cardiovascular: RRR in NSR. No M/R/G. 2+ radial pulses. Warm and well perfused. Respiratory: CTAB. No crackles, wheezing, or rhonchi. Abdomen: Soft, non-tender, non-distended. Normoactive bowel sounds Extremities: 2+ BLE edema  Laboratory: Most recent CBC Lab Results  Component Value Date   WBC 9.1 10/24/2022   HGB 13.8 10/24/2022   HCT 45.4 10/24/2022   MCV 86.8 10/24/2022   PLT 326 10/24/2022   Most recent BMP    Latest Ref Rng & Units 10/24/2022    3:42 AM  BMP  Glucose 70 - 99 mg/dL 147   BUN 8 - 23 mg/dL 20   Creatinine 8.29 - 1.24 mg/dL 5.62   Sodium 130 - 865 mmol/L 136   Potassium 3.5 - 5.1 mmol/L 4.0   Chloride 98 - 111 mmol/L 96   CO2 22 - 32 mmol/L 27   Calcium 8.9 - 10.3 mg/dL 9.0    Imaging/Diagnostic Tests: None  Fortunato Curling, DO 10/24/2022, 6:32 AM PGY-1, [facility]  FPTS Intern pager: [phone], text pages welcome Secure chat group [facility] Teaching Service

## 2022-10-24 NOTE — Evaluation (Signed)
Occupational Therapy Evaluation Patient Details Name: Karl Peters MRN: [id number] DOB: [birth year] Today's Date: 10/24/2022   History of Present Illness Karl Peters is an 82 yo male who presented with SOB for 2 weeks and recent N&V. Admitted for CHF exacerbation. 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 He lives with family and is indep at baseline. Upon evaluation the pt was limited by generalized weakness, fatigue and decreased activity tolerance. Overall  he needed up to [facility] for transfers and hallway mobility without AD. No significant SOB noted after activity with VSS on RA. Due to the deficits listed below the pt also needs up to [facility] for ADLs with increased time and cues for energy conservation strategies. Pt will benefit from continued acute OT services and s/c home with support of family.        If plan is discharge home, recommend the following: Assistance with cooking/housework;Assist for transportation    Functional Status Assessment  Patient has had a recent decline in their functional status and demonstrates the ability to make significant improvements in function in a reasonable and predictable amount of time.  Equipment Recommendations  None recommended by OT       Precautions / Restrictions Precautions Precautions: Fall Restrictions Weight Bearing Restrictions: No      Mobility Bed Mobility Overal bed mobility: Modified Independent             General bed mobility comments: from stretcher    Transfers Overall transfer level: Needs assistance Equipment used: None Transfers: Sit to/from Stand Sit to Stand: Contact guard assist                  Balance Overall balance assessment: Mild deficits observed, not formally tested                                         ADL either performed or assessed with  clinical judgement   ADL Overall ADL's : Needs assistance/impaired Eating/Feeding: Independent   Grooming: Contact guard assist;Standing   Upper Body Bathing: Set up;Sitting   Lower Body Bathing: Contact guard assist;Sit to/from stand   Upper Body Dressing : Set up;Sitting   Lower Body Dressing: Contact guard assist;Sit to/from stand   Toilet Transfer: Contact guard assist;Ambulation Statistician Details (indicate cue type and reason): no AD Toileting- Clothing Manipulation and Hygiene: Contact guard assist;Sit to/from stand       Functional mobility during ADLs: Contact guard assist General ADL Comments: mildly increased SOB with ambulation, on RA, VSS     Vision Baseline Vision/History: 0 No visual deficits Vision Assessment?: No apparent visual deficits     Perception Perception: Within Functional Limits       Praxis Praxis: WFL       Pertinent Vitals/Pain Pain Assessment Pain Assessment: No/denies pain     Extremity/Trunk Assessment Upper Extremity Assessment Upper Extremity Assessment: Overall WFL for tasks assessed   Lower Extremity Assessment Lower Extremity Assessment: Defer to PT evaluation   Cervical / Trunk Assessment Cervical / Trunk Assessment: Normal   Communication Communication Communication: Hearing impairment   Cognition Arousal: Alert Behavior During Therapy: WFL for tasks assessed/performed Overall Cognitive Status: Within Functional Limits for tasks assessed  General Comments: would benefit from higher level assessment     General Comments  VSS on RA    Exercises     Shoulder Instructions      Home Living Family/patient expects to be discharged to:: Private residence (Daughter's house) Living Arrangements: Children Available Help at Discharge: Family;Available 24 hours/day Type of Home: House Home Access: Level entry     Home Layout: One level     Bathroom Shower/Tub:  Chief Strategy Officer: Standard     Home Equipment: None          Prior Functioning/Environment Prior Level of Function : Independent/Modified Independent;Driving             Mobility Comments: denies falls ADLs Comments: indep        OT Problem List: Decreased activity tolerance;Impaired balance (sitting and/or standing);Cardiopulmonary status limiting activity      OT Treatment/Interventions: Self-care/ADL training;Therapeutic exercise;Energy conservation    OT Goals(Current goals can be found in the care plan section) Acute Rehab OT Goals Patient Stated Goal: to go home OT Goal Formulation: With patient Time For Goal Achievement: 11/07/22 Potential to Achieve Goals: Good ADL Goals Additional ADL Goal #1: Pt will complete all BADLs with mod I Additional ADL Goal #2: Pt will indep recall at least 3 energy conservation tehcniques to apply at discharge  OT Frequency: Min 1X/week    Co-evaluation              AM-PAC OT "6 Clicks" Daily Activity     Outcome Measure Help from another person eating meals?: None Help from another person taking care of personal grooming?: A Little Help from another person toileting, which includes using toliet, bedpan, or urinal?: A Little Help from another person bathing (including washing, rinsing, drying)?: A Little Help from another person to put on and taking off regular upper body clothing?: A Little Help from another person to put on and taking off regular lower body clothing?: A Little 6 Click Score: 19   End of Session Equipment Utilized During Treatment: Gait belt Nurse Communication: Mobility status  Activity Tolerance: Patient tolerated treatment well Patient left: in bed;with call bell/phone within reach  OT Visit Diagnosis: Unsteadiness on feet (R26.81);Other abnormalities of gait and mobility (R26.89);Muscle weakness (generalized) (M62.81)                Time: 5366-4403 OT Time Calculation (min): 14  min Charges:  OT General Charges $OT Visit: 1 Visit OT Evaluation $OT Eval Moderate Complexity: 1 Mod  Derenda Mis, OTR/L Acute Rehabilitation Services Office [phone] Secure Chat Communication Preferred   Karl Peters 10/24/2022, 2:52 PM

## 2022-10-25 ENCOUNTER — Inpatient Hospital Stay (HOSPITAL_COMMUNITY): Payer: Medicare Other

## 2022-10-25 DIAGNOSIS — I509 Heart failure, unspecified: Secondary | ICD-10-CM | POA: Diagnosis not present

## 2022-10-25 DIAGNOSIS — I5021 Acute systolic (congestive) heart failure: Secondary | ICD-10-CM | POA: Diagnosis not present

## 2022-10-25 DIAGNOSIS — I10 Essential (primary) hypertension: Secondary | ICD-10-CM | POA: Insufficient documentation

## 2022-10-25 DIAGNOSIS — J90 Pleural effusion, not elsewhere classified: Secondary | ICD-10-CM | POA: Insufficient documentation

## 2022-10-25 LAB — BASIC METABOLIC PANEL
Anion gap: 10 (ref 5–15)
BUN: 26 mg/dL — ABNORMAL HIGH (ref 8–23)
CO2: 27 mmol/L (ref 22–32)
Calcium: 8.5 mg/dL — ABNORMAL LOW (ref 8.9–10.3)
Chloride: 99 mmol/L (ref 98–111)
Creatinine, Ser: 1.73 mg/dL — ABNORMAL HIGH (ref 0.61–1.24)
GFR, Estimated: 39 mL/min — ABNORMAL LOW (ref >60)
Glucose, Bld: 147 mg/dL — ABNORMAL HIGH (ref 70–99)
Potassium: 3.8 mmol/L (ref 3.5–5.1)
Sodium: 136 mmol/L (ref 135–145)

## 2022-10-25 LAB — CBC
HCT: 40.4 % (ref 39.0–52.0)
Hemoglobin: 12.9 g/dL — ABNORMAL LOW (ref 13.0–17.0)
MCH: 26.8 pg (ref 26.0–34.0)
MCHC: 31.9 g/dL (ref 30.0–36.0)
MCV: 83.8 fL (ref 80.0–100.0)
Platelets: 322 10*3/uL (ref 150–400)
RBC: 4.82 MIL/uL (ref 4.22–5.81)
RDW: 15.1 % (ref 11.5–15.5)
WBC: 8.9 10*3/uL (ref 4.0–10.5)
nRBC: 0 % (ref 0.0–0.2)

## 2022-10-25 LAB — MAGNESIUM: Magnesium: 1.9 mg/dL (ref 1.7–2.4)

## 2022-10-25 LAB — GLUCOSE, CAPILLARY
Glucose-Capillary: 183 mg/dL — ABNORMAL HIGH (ref 70–99)
Glucose-Capillary: 187 mg/dL — ABNORMAL HIGH (ref 70–99)

## 2022-10-25 LAB — ECHOCARDIOGRAM COMPLETE
Area-P 1/2: 4.24 cm2
Height: 68 in
S' Lateral: 4.1 cm
Weight: 3971.81 [oz_av]

## 2022-10-25 MED ORDER — FUROSEMIDE 40 MG PO TABS
40.0000 mg | ORAL_TABLET | Freq: Every day | ORAL | Status: DC
  Administered 2022-10-25 – 2022-10-26 (×2): 40 mg via ORAL
  Filled 2022-10-25 (×2): qty 1

## 2022-10-25 MED ORDER — DICLOFENAC SODIUM 1 % EX GEL
2.0000 g | Freq: Two times a day (BID) | CUTANEOUS | Status: DC
  Administered 2022-10-26 (×2): 2 g via TOPICAL
  Filled 2022-10-25: qty 100

## 2022-10-25 MED ORDER — PERFLUTREN LIPID MICROSPHERE
1.0000 mL | INTRAVENOUS | Status: AC | PRN
  Administered 2022-10-25: 6 mL via INTRAVENOUS

## 2022-10-25 MED ORDER — FAMOTIDINE 20 MG PO TABS
40.0000 mg | ORAL_TABLET | Freq: Once | ORAL | Status: AC
  Administered 2022-10-25: 40 mg via ORAL
  Filled 2022-10-25: qty 2

## 2022-10-25 NOTE — Progress Notes (Addendum)
1332: Received call from unit secretary that patient c/o  nausea. No prns ordered- MD informed via secure chat. Patient also c/o of mid epigastric pain with the nausea. Patient reported he just finished eating. MD made aware.  1340: patient informed of ordered for pepcid- awaiting pharmacy verification. Patient reports he feels like he has to have a BM. Patient educated not to strain.  1407: Patient reports he had a small BM. Given po pecid to take. Patient states he sometimes get lower abd pain when he has a BM that sometimes causes him to pass out. Denies having any lower abd pain. Patient still c/o of mid epigastric pain at this time.

## 2022-10-25 NOTE — Assessment & Plan Note (Signed)
BG improved with sliding scale insulin.  A1c 7.1 on admission, increased from 6.5. - Sensitive SSI  - CBG checks ACHS

## 2022-10-25 NOTE — Assessment & Plan Note (Addendum)
BP remains soft with dizziness yesterday 9/7.  All antihypertensives held. -Restart antihypertensives as indicated, likely will need with less diuretics

## 2022-10-25 NOTE — Assessment & Plan Note (Signed)
150s-170/low 100s. Takes Hydralazine 10 mg TID and Toprol-XL 50 mg daily. - Restart home Hydralazine 10 mg TID and Toprol-XL 50 mg daily

## 2022-10-25 NOTE — Assessment & Plan Note (Signed)
BP improved from 120-140s/70s-80s s/p restarting antihypertensives. - Continue Metoprolol Succinate 50 mg daily and Hydralazine 10 mg TID

## 2022-10-25 NOTE — Plan of Care (Signed)
Patient without distress or complaints. Sleeping well. Will continue to monitor.   Problem: Education: Goal: Ability to demonstrate management of disease process will improve Outcome: Progressing   Problem: Activity: Goal: Capacity to carry out activities will improve Outcome: Progressing   Problem: Cardiac: Goal: Ability to achieve and maintain adequate cardiopulmonary perfusion will improve Outcome: Progressing   Problem: Coping: Goal: Ability to adjust to condition or change in health will improve Outcome: Progressing   Problem: Fluid Volume: Goal: Ability to maintain a balanced intake and output will improve Outcome: Progressing

## 2022-10-25 NOTE — Progress Notes (Signed)
Nurse instructed to perform bladder scan. Patient only voided once this morning and stated that he had "dribbling" this afternoon. Per nurse aide, bladder scan was 0mL and patient voided .

## 2022-10-25 NOTE — Progress Notes (Signed)
Daily Progress Note Intern Pager: [phone]  Patient name: Karl Peters Medical record number: [id number] Date of birth: May 17, 1940 Age: 82 y.o. Gender: male  Primary Care Provider: Eartha Inch, MD Consultants: None Code Status: Full  Pt Overview and Major Events to Date:  9/6-admitted  Assessment and Plan: Karl Peters is a 82 y.o. 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  Assessment & Plan Acute exacerbation of CHF (congestive heart failure) (HCC) Improving now status post 3 doses IV Lasix 40 mg.  Weight down 6 kg.  Urine output 1.2 L 9/7. Cr bumped 1.39>1.73. Troponins overall flat with reassuring EKG.  Echo pending. -Hold lasix today, assess further need given creatinine bump -Follow-up echo -Continue cardiac monitoring, daily weight, strict I/O's -Currently off GDMT given softer BP, consider readding as appropriate DM2 (diabetes mellitus, type 2) (HCC) BG improved with sliding scale insulin.  A1c 7.1 on admission, increased from 6.5. - Sensitive SSI  - CBG checks ACHS Primary hypertension BP remains soft with dizziness yesterday 9/7.  All antihypertensives held. -Restart antihypertensives as indicated, likely will need with less diuretics Pleural effusion R sided pleural effusion likely secondary to CHF exacerbation and causing SOB. - Repeat CXR outpatient unless worsening symptoms   Chronic and Stable Issues: HLD, CAD-continue home meds  FEN/GI: Heart healthy diet PPx: Lovenox Dispo:Home pending clinical improvement .  Subjective:  Feeling better this morning. Breathing better. Able to sleep lying down better. Did have an episode this morning where he reached over really fast to try and catch his hearing aids and now has lateral R chest wall pain mostly with palpation, movement, and deep inspiration. It has been bothering him, and he cannot go back to sleep.  Objective: Temp:  [97.3  F (36.3 C)-98.4 F (36.9 C)] 98.1 F (36.7 C) (09/07 1952) Pulse Rate:  [52-99] 65 (09/08 0105) Resp:  [13-33] 17 (09/08 0105) BP: (88-145)/(53-98) 104/75 (09/08 0105) SpO2:  [90 %-98 %] 93 % (09/08 0105) Weight:  [111.9 kg] 111.9 kg (09/07 1419) Physical Exam: General: Sitting up on side of bed, NAD Cardiovascular: RRR without m/r/g, 1-2+ pitting edema to mid-shin BLE Respiratory: Decreased breath sounds in RLL, otherwise good air movement throughout, speaking comfortably on RA  Laboratory: Most recent CBC Lab Results  Component Value Date   WBC 9.1 10/24/2022   HGB 13.8 10/24/2022   HCT 45.4 10/24/2022   MCV 86.8 10/24/2022   PLT 326 10/24/2022   Most recent BMP    Latest Ref Rng & Units 10/24/2022    3:42 AM  BMP  Glucose 70 - 99 mg/dL 324   BUN 8 - 23 mg/dL 20   Creatinine 4.01 - 1.24 mg/dL 0.27   Sodium 253 - 664 mmol/L 136   Potassium 3.5 - 5.1 mmol/L 4.0   Chloride 98 - 111 mmol/L 96   CO2 22 - 32 mmol/L 27   Calcium 8.9 - 10.3 mg/dL 9.0    Evette Georges, MD 10/25/2022, 2:20 AM  PGY-2,  Family Medicine FPTS Intern pager: [phone], text pages welcome Secure chat group [facility] Teaching Service

## 2022-10-25 NOTE — Assessment & Plan Note (Signed)
R sided pleural effusion likely secondary to CHF exacerbation and causing SOB. Will continue to monitor symptoms. - Repeat CXR outpatient to monitor effusion vs consolidation, can't rule out malignancy

## 2022-10-25 NOTE — Progress Notes (Signed)
  Echocardiogram 2D Echocardiogram has been performed.  Leda Roys RDCS 10/25/2022, 10:46 AM

## 2022-10-25 NOTE — Assessment & Plan Note (Addendum)
Improving now status post 3 doses IV Lasix 40 mg.  Weight down 6 kg.  Urine output 1.2 L 9/7. Cr bumped 1.39>1.73. Troponins overall flat with reassuring EKG.  Echo pending. -Hold lasix today, assess further need given creatinine bump -Follow-up echo -Continue cardiac monitoring, daily weight, strict I/O's -Currently off GDMT given softer BP, consider readding as appropriate

## 2022-10-25 NOTE — Assessment & Plan Note (Signed)
R sided effusion could be d/t CHF exacerbation and causing SOB. Cannot r/o malignancy, infection. Needs to be monitored.  -repeat CXR out patient or sooner if indicated

## 2022-10-25 NOTE — Assessment & Plan Note (Signed)
R sided pleural effusion likely secondary to CHF exacerbation and causing SOB. - Repeat CXR outpatient unless worsening symptoms

## 2022-10-25 NOTE — Evaluation (Signed)
Physical Therapy Evaluation Patient Details Name: Karl Peters MRN: [id number] DOB: [birth year] Today's Date: 10/25/2022  History of Present Illness  Karl Peters is an 82 yo male who presented with SOB for 2 weeks and recent N&V. Admitted for CHF exacerbation. 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 Pt had low tolerance for activity and stood with dizziness with BP  93/55 and asking to lie back down.  Nursing made aware that evaluation limited due to pt feeling poorly.  Will continue to further assess mobiity at next session.   Pt currently with functional limitations due to the deficits listed below (see PT Problem List). Pt will benefit from acute skilled PT to increase their independence and safety with mobility to allow discharge.           If plan is discharge home, recommend the following: Assistance with cooking/housework;Assist for transportation;Help with stairs or ramp for entrance   Can travel by private vehicle        Equipment Recommendations None recommended by PT  Recommendations for Other Services       Functional Status Assessment Patient has had a recent decline in their functional status and demonstrates the ability to make significant improvements in function in a reasonable and predictable amount of time.     Precautions / Restrictions Precautions Precautions: Fall Restrictions Weight Bearing Restrictions: No      Mobility  Bed Mobility Overal bed mobility: Modified Independent             General bed mobility comments: sitting EOB on arrival.    Transfers Overall transfer level: Needs assistance Equipment used: None, 2 person hand held assist Transfers: Sit to/from Stand Sit to Stand: Contact guard assist           General transfer comment: can stand statically with at least 1 UE support however pt reports not feeling well and  needing to lie back down.  Took BP and BP low at 97/61 sitting,  and once pt was supine 93/55.  Nursing made aware that pt not feeling well and that he wouldnt do more with PT.    Ambulation/Gait                  Stairs            Wheelchair Mobility     Tilt Bed    Modified Rankin (Stroke Patients Only)       Balance                                             Pertinent Vitals/Pain Pain Assessment Pain Assessment: No/denies pain    Home Living Family/patient expects to be discharged to:: Private residence (Daughter's house) Living Arrangements: Children Available Help at Discharge: Family;Available 24 hours/day (wife always there and can only provide supervision, daughter is a CMA) Type of Home: House Home Access: Level entry       Home Layout: One level Home Equipment: Agricultural consultant (2 wheels) (walking stick at home that pt used outdoors at times)      Prior Function Prior Level of Function : Independent/Modified Independent;Driving             Mobility Comments: denies falls ADLs Comments: indep     Extremity/Trunk Assessment   Upper Extremity  Assessment Upper Extremity Assessment: Defer to OT evaluation    Lower Extremity Assessment Lower Extremity Assessment: Generalized weakness    Cervical / Trunk Assessment Cervical / Trunk Assessment: Normal  Communication   Communication Communication: Hearing impairment  Cognition Arousal: Alert Behavior During Therapy: WFL for tasks assessed/performed Overall Cognitive Status: Within Functional Limits for tasks assessed                                 General Comments: would benefit from higher level assessment        General Comments General comments (skin integrity, edema, etc.): 62 bpm, 95% RA, 97/61 sitting, supine 93/55 with HR 66 bpm.  Pt feeling lightheaded.  103/60 once back to bed    Exercises General Exercises - Lower Extremity Ankle  Circles/Pumps: AROM, Both, 10 reps, Seated Long Arc Quad: AROM, Both, 10 reps, Seated   Assessment/Plan    PT Assessment Patient needs continued PT services  PT Problem List Decreased activity tolerance;Decreased balance;Decreased mobility;Decreased safety awareness;Decreased knowledge of use of DME;Decreased knowledge of precautions;Cardiopulmonary status limiting activity       PT Treatment Interventions DME instruction;Gait training;Functional mobility training;Therapeutic activities;Therapeutic exercise;Balance training;Patient/family education    PT Goals (Current goals can be found in the Care Plan section)  Acute Rehab PT Goals Patient Stated Goal: to go home PT Goal Formulation: With patient Time For Goal Achievement: 11/08/22 Potential to Achieve Goals: Good    Frequency Min 1X/week     Co-evaluation               AM-PAC PT "6 Clicks" Mobility  Outcome Measure Help needed turning from your back to your side while in a flat bed without using bedrails?: None Help needed moving from lying on your back to sitting on the side of a flat bed without using bedrails?: A Little Help needed moving to and from a bed to a chair (including a wheelchair)?: A Little Help needed standing up from a chair using your arms (e.g., wheelchair or bedside chair)?: A Little Help needed to walk in hospital room?: A Little Help needed climbing 3-5 steps with a railing? : A Little 6 Click Score: 19    End of Session Equipment Utilized During Treatment: Gait belt Activity Tolerance: Patient limited by fatigue Patient left: in bed;with call bell/phone within reach;with bed alarm set Nurse Communication: Mobility status PT Visit Diagnosis: Unsteadiness on feet (R26.81);Muscle weakness (generalized) (M62.81)    Time: 1308-6578 PT Time Calculation (min) (ACUTE ONLY): 12 min   Charges:   PT Evaluation $PT Eval Moderate Complexity: 1 Mod   PT General Charges $$ ACUTE PT VISIT: 1  Visit         Lashon Hillier M,PT Acute Rehab Services [phone]   Bevelyn Buckles 10/25/2022, 3:51 PM

## 2022-10-26 DIAGNOSIS — I509 Heart failure, unspecified: Secondary | ICD-10-CM | POA: Diagnosis not present

## 2022-10-26 LAB — BASIC METABOLIC PANEL
Anion gap: 12 (ref 5–15)
BUN: 28 mg/dL — ABNORMAL HIGH (ref 8–23)
CO2: 25 mmol/L (ref 22–32)
Calcium: 8.4 mg/dL — ABNORMAL LOW (ref 8.9–10.3)
Chloride: 99 mmol/L (ref 98–111)
Creatinine, Ser: 1.74 mg/dL — ABNORMAL HIGH (ref 0.61–1.24)
GFR, Estimated: 39 mL/min — ABNORMAL LOW (ref >60)
Glucose, Bld: 135 mg/dL — ABNORMAL HIGH (ref 70–99)
Potassium: 3.5 mmol/L (ref 3.5–5.1)
Sodium: 136 mmol/L (ref 135–145)

## 2022-10-26 LAB — MAGNESIUM: Magnesium: 1.7 mg/dL (ref 1.7–2.4)

## 2022-10-26 LAB — GLUCOSE, CAPILLARY
Glucose-Capillary: 161 mg/dL — ABNORMAL HIGH (ref 70–99)
Glucose-Capillary: 187 mg/dL — ABNORMAL HIGH (ref 70–99)

## 2022-10-26 MED ORDER — POTASSIUM CHLORIDE CRYS ER 20 MEQ PO TBCR
40.0000 meq | EXTENDED_RELEASE_TABLET | Freq: Once | ORAL | Status: AC
  Administered 2022-10-26: 40 meq via ORAL
  Filled 2022-10-26: qty 2

## 2022-10-26 MED ORDER — MAGNESIUM SULFATE 2 GM/50ML IV SOLN
2.0000 g | Freq: Once | INTRAVENOUS | Status: AC
  Administered 2022-10-26: 2 g via INTRAVENOUS
  Filled 2022-10-26: qty 50

## 2022-10-26 MED ORDER — ASPIRIN 81 MG PO TBEC: 81.0000 mg | DELAYED_RELEASE_TABLET | Freq: Every day | ORAL | Status: AC

## 2022-10-26 MED ORDER — FUROSEMIDE 40 MG PO TABS: ORAL_TABLET | ORAL | Status: DC

## 2022-10-26 NOTE — Progress Notes (Addendum)
Discharge instructions reviewed with pt. Discussed CHF education and importance of daily weights, recording weights and when to call MD.  Copy of instructions given to pt. No new scripts. Pt calling his ride that is near the hospital and is getting dressed at this time.  Message sent to MD and pharmacy about pt stating he takes his lasix differently than what is on his AVS -- at home he states he takes 1 whole tablet 40mg  on M,W,F, and takes 1/2 tablet on Tues & Thurs, and none on Sat & Sun.    AVS has been updated and reprinted for pt.      Pt to be d/c'd via wheelchair with belongings, when his ride arrives.              To be escorted by staff/hospital volunteer.  Nelissa Bolduc,RN SWOT

## 2022-10-26 NOTE — Progress Notes (Signed)
Heart Failure Navigator Progress Note  PCP: Eartha Inch, MD PCP-Cardiologist: Dr. Izora Ribas Admission Diagnosis: acute exacerbation of CHF Admitted from: home  Presentation:   82 yo M with PMH of CHF, CAD, prior VT, T2DM, HLD, and HTN.    In 09/2020, ECHO showed LVEF 40-45%. He underwent stress test that showed findings consistent with prior MI, EF <30%. Cardiac cath showed mild CAD. Repeat ECHO 11/2020 showed EF improved to 50-55%. Admitted 03/2021 with acute on chronic CHF. EF 25-30%, thought to be decreased with to ventricular ectopy. Was diuresed and discharged with the addition of spironolactone. Was seen as outpatient for possible ablation but was asymptomatic and deemed not a great candidate.    Presented to the ED on 9/6 with shortness of breath and dry cough x 2 weeks. Reports that he has been vomiting over the past two weeks and hasn't been able to take his lasix. CXR with heterogenous opacities concerning for combination of consolidation and pleural effusion. ECHO 9/8 showed LVEF 40-45%, global hypokinesis, G2DD, RV normal.   ECHO/ LVEF: 40-45%  Education Assessment and Provision:  Detailed education and instructions provided on heart failure disease management including the following:  Signs and symptoms of Heart Failure When to call the physician Importance of daily weights Low sodium diet Fluid restriction Medication management Anticipated future follow-up appointments  Patient education given on each of the above topics.  Patient acknowledges understanding via teach back method and acceptance of all instructions.  Education Materials:  "Living Better With Heart Failure" Booklet, HF zone tool, & Daily Weight Tracker Tool.  Patient has scale at home: yes Patient has pill box at home: yes    Clinical Course:  Past Medical History:  Diagnosis Date   Acute on chronic diastolic CHF (congestive heart failure), NYHA class 3 (HCC) 04/10/2021   Diabetes mellitus     Dyslipidemia    Hx of cardiac catheterization    a. normal cors by cath in 1998 and 2010   Hx of echocardiogram    Echocardiogram 06/10/12: Normal LV wall thickness, EF 50-55%, grade 1 diastolic dysfunction, mild LAE   Hypertension    Obesity    Ventricular tachycardia (HCC)    and symptomatic PVCs - Rx with beta blocker     Social History   Socioeconomic History   Marital status: Single    Spouse name: Not on file   Number of children: Not on file   Years of education: Not on file   Highest education level: Not on file  Occupational History   Not on file  Tobacco Use   Smoking status: Never   Smokeless tobacco: Never  Substance and Sexual Activity   Alcohol use: No   Drug use: Never   Sexual activity: Not on file  Other Topics Concern   Not on file  Social History Narrative   Not on file   Social Determinants of Health   Financial Resource Strain: Low Risk  (10/24/2021)   Received from [facility], [facility]   Overall Financial Resource Strain (CARDIA)    Difficulty of Paying Living Expenses: Not hard at all  Food Insecurity: No Food Insecurity (10/24/2022)   Hunger Vital Sign    Worried About Running Out of Food in the Last Year: Never true    Ran Out of Food in the Last Year: Never true  Transportation Needs: No Transportation Needs (10/24/2022)   PRAPARE - Transportation    Lack of Transportation (Medical): No    Lack of  Transportation (Non-Medical): No  Physical Activity: Unknown (10/24/2021)   Received from [facility], [facility]   Exercise Vital Sign    Days of Exercise per Week: 0 days    Minutes of Exercise per Session: Not on file  Stress: No Stress Concern Present (10/24/2021)   Received from [facility], [facility] of Occupational Health - Occupational Stress Questionnaire    Feeling of Stress : Not at all  Social Connections: Unknown (10/25/2022)   Received from [facility]   Social Network    Social Network: Not on  file    High Risk Criteria for Readmission and/or Poor Patient Outcomes: Heart failure hospital admissions (last 6 months): 0  No Show rate: 3% Difficult social situation: no Demonstrates medication adherence: yes Primary Language: English  Barriers of Care:   AKI  Considerations/Referrals:   Referral made to Heart Failure Pharmacist Stewardship: yes Referral made to Heart Failure CSW/NCM TOC: no Referral made to Heart & Vascular TOC clinic: yes, appt scheduled for 9/18  Items for Follow-up on DC/TOC: Repeat BMET GDMT optimization and resuming Entresto Volume  Sharen Hones, PharmD, BCPS Heart Failure Stewardship Pharmacist Phone [phone]

## 2022-10-26 NOTE — Assessment & Plan Note (Deleted)
BP remains soft with dizziness yesterday 9/7.  All antihypertensives held, BP stable this AM. - Restart antihypertensives as indicated, likely will need with less diuretics

## 2022-10-26 NOTE — Discharge Instructions (Addendum)
Dear Karl Peters,   Thank you so much for allowing [place] to be part of your care!  You were admitted to [facility] for heart failure exacerbation.   We treated you by removing fluid with medication.  Continue with Lasix home dosing.  1 whole tablet (40mg ) on MON, WED, FRI, 1/2 tablet on TUES & THURS, and NONE on SAT & SUN   Hold your hydralazine and Entresto until you see your cardiologist   POST-HOSPITAL & CARE INSTRUCTIONS Follow up with cardiology  Please let PCP/Specialists know of any changes that were made.  Please see medications section of this packet for any medication changes.   DOCTOR'S APPOINTMENT & FOLLOW UP CARE INSTRUCTIONS  Follow up with cardiology and your PCP  RETURN PRECAUTIONS: -Shortness of breath -Chest pain  -Confusion  -Swelling in the legs  Take care and be well!  Family Medicine Teaching Service  [place]  [facility]  [address] [place], [place] [address] [phone]

## 2022-10-26 NOTE — Plan of Care (Signed)
  Problem: Activity: Goal: Capacity to carry out activities will improve Outcome: Progressing   Problem: Coping: Goal: Ability to adjust to condition or change in health will improve Outcome: Progressing   Problem: Clinical Measurements: Goal: Respiratory complications will improve Outcome: Progressing

## 2022-10-26 NOTE — TOC Initial Note (Signed)
Transition of Care [facility]) - Initial/Assessment Note    Patient Details  Name: Karl Peters MRN: [id number] Date of Birth: 06/14/1940  [facility]) CM/SW Contact:    Leone Haven, RN Phone Number: 10/26/2022, 11:48 AM  Clinical Narrative:                 From home with spouse and daughter, has PCP and insurance on file, states has no HH services in place at this time , his wife has a cane and a walker that neither one of them uses.   States daughter will transport him home at [employer] and family is support system, [facility]  Pta self ambulatory .  Expected Discharge Plan: Home/Self Care Barriers to Discharge: No Barriers Identified   Patient Goals and CMS Choice Patient states their goals for this hospitalization and ongoing recovery are:: return home   Choice offered to / list presented to : NA      Expected Discharge Plan and Services In-house Referral: NA Discharge Planning Services: CM Consult Post Acute Care Choice: NA Living arrangements for the past 2 months: Single Family Home Expected Discharge Date: 10/26/22               DME Arranged: N/A DME Agency: NA       HH Arranged: NA          Prior Living Arrangements/Services Living arrangements for the past 2 months: Single Family Home Lives with:: Spouse Patient language and need for interpreter reviewed:: Yes Do you feel safe going back to the place where you live?: Yes      Need for Family Participation in Patient Care: Yes (Comment) Care giver support system in place?: Yes (comment)   Criminal Activity/Legal Involvement Pertinent to Current Situation/Hospitalization: No - Comment as needed  Activities of Daily Living Home Assistive Devices/Equipment: Other (Comment) ("uses walking stick when I am outside") ADL Screening (condition at time of admission) Patient's cognitive ability adequate to safely complete daily activities?: Yes Is the patient deaf or have  difficulty hearing?: Yes Does the patient have difficulty seeing, even when wearing glasses/contacts?: No Does the patient have difficulty concentrating, remembering, or making decisions?: No Patient able to express need for assistance with ADLs?: Yes Does the patient have difficulty dressing or bathing?: No Independently performs ADLs?: Yes (appropriate for developmental age) Does the patient have difficulty walking or climbing stairs?: Yes Weakness of Legs: None Weakness of Arms/Hands: None  Permission Sought/Granted Permission sought to share information with : Case Manager Permission granted to share information with : Yes, Verbal Permission Granted              Emotional Assessment Appearance:: Appears stated age Attitude/Demeanor/Rapport: Engaged Affect (typically observed): Appropriate Orientation: : Oriented to Self, Oriented to Place, Oriented to Situation, Oriented to  Time Alcohol / Substance Use: Not Applicable Psych Involvement: No (comment)  Admission diagnosis:  Primary hypertension [I10] Pleural effusion [J90] Acute exacerbation of CHF (congestive heart failure) (HCC) [I50.9] Type 2 diabetes mellitus without complication, without long-term current use of insulin (HCC) [E11.9] Acute congestive heart failure, unspecified heart failure type [facility]) [I50.9] Patient Active Problem List   Diagnosis Date Noted   Primary hypertension 10/25/2022   Pleural effusion 10/25/2022   Acute exacerbation of CHF (congestive heart failure) (HCC) 10/23/2022   Acute on chronic systolic CHF (congestive heart failure) (HCC) 04/12/2021   Non-ischemic cardiomyopathy (HCC) 04/12/2021   Non-obstructive CAD 04/12/2021   Hyperlipidemia 04/12/2021  Acute on chronic diastolic CHF (congestive heart failure) (HCC) 04/11/2021   Acute on chronic diastolic CHF (congestive heart failure), NYHA class 3 (HCC) 04/10/2021   Thrombophlebitis 10/29/2020   Bacteremia due to Gram-positive bacteria  10/29/2020   SOB (shortness of breath)    AKI (acute kidney injury) (HCC) 10/07/2020   Arrhythmia 10/07/2020   Hypotension 06/09/2012   Chest pressure 06/09/2012   DM2 (diabetes mellitus, type 2) (HCC) 05/24/2008   DEPRESSION 05/24/2008   Essential hypertension 05/24/2008   NSVT (nonsustained ventricular tachycardia) (HCC) 05/24/2008   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 [place]  [address] Phone: [phone] Fax: [phone]  [facility] [address] [place] [address] Phone: [phone] Fax: [phone]     Social Determinants of Health (SDOH) Social History: SDOH Screenings   Food Insecurity: No Food Insecurity (10/24/2022)  Housing: High Risk (10/24/2022)  Transportation Needs: No Transportation Needs (10/24/2022)  Utilities: Not At Risk (10/24/2022)  Financial Resource Strain: Low Risk  (10/24/2021)   Received from [facility], [facility]  Physical Activity: Unknown (10/24/2021)   Received from [facility], [facility]  Social Connections: Unknown (10/25/2022)   Received from [facility]  Stress: No Stress Concern Present (10/24/2021)   Received from [facility], [facility]  Tobacco Use: Low Risk  (10/23/2022)   SDOH Interventions:     Readmission Risk Interventions     No data to display

## 2022-10-26 NOTE — TOC Transition Note (Signed)
Transition of Care [facility]) - CM/SW Discharge Note   Patient Details  Name: Karl Peters MRN: [id number] Date of Birth: 1940/09/28  Transition of Care [facility]) CM/SW Contact:  Leone Haven, RN Phone Number: 10/26/2022, 11:49 AM   Clinical Narrative:    Patient is for dc home today, NCM spoke with patient about HHPT, he states the physical therapist informed him that he does not need it, so he said he does not want HH at this time.  His daughter will transport him home at dc.    Final next level of care: Home/Self Care Barriers to Discharge: No Barriers Identified   Patient Goals and CMS Choice   Choice offered to / list presented to : NA  Discharge Placement                         Discharge Plan and Services Additional resources added to the After Visit Summary for   In-house Referral: NA Discharge Planning Services: CM Consult Post Acute Care Choice: NA          DME Arranged: N/A DME Agency: NA       HH Arranged: NA          Social Determinants of Health (SDOH) Interventions SDOH Screenings   Food Insecurity: No Food Insecurity (10/24/2022)  Housing: High Risk (10/24/2022)  Transportation Needs: No Transportation Needs (10/24/2022)  Utilities: Not At Risk (10/24/2022)  Financial Resource Strain: Low Risk  (10/24/2021)   Received from [facility], [facility]  Physical Activity: Unknown (10/24/2021)   Received from [facility], [facility]  Social Connections: Unknown (10/25/2022)   Received from [facility]  Stress: No Stress Concern Present (10/24/2021)   Received from [facility], [facility]  Tobacco Use: Low Risk  (10/23/2022)     Readmission Risk Interventions     No data to display

## 2022-10-26 NOTE — Assessment & Plan Note (Deleted)
BG improved with sliding scale insulin.  A1c 7.1 on admission, increased from 6.5. - Sensitive SSI  - CBG checks ACHS

## 2022-10-26 NOTE — Progress Notes (Signed)
Mobility Specialist Progress Note:  Nurse requested Mobility Specialist to perform oxygen saturation test with pt which includes removing pt from oxygen both at rest and while ambulating.  Below are the results from that testing.     Patient Saturations on Room Air at Rest = spO2 95%  Patient Saturations on Room Air while Ambulating = sp02 91% .    At end of testing pt left in room on 0  Liters of oxygen.  Reported results to nurse.    Thompson Grayer Mobility Specialist  Please contact vis Secure Chat or  Rehab Office [phone]

## 2022-10-26 NOTE — Discharge Summary (Addendum)
Family Medicine Teaching [facility] Discharge Summary  Patient name: Karl Peters Medical record number: [id number] Date of birth: Apr 23, 1940 Age: 82 y.o. Gender: male Date of Admission: 10/23/2022  Date of Discharge: 10/26/22  Admitting Physician: Fortunato Curling, DO  Primary Care Provider: Eartha Inch, MD Consultants: None  Indication for Hospitalization: Acute CHF Exacerbation  Brief Hospital Course:  Karl Peters is a 82 y.o.male with a history of HFrEF (30% EF), CAD, T2DM, HLD, HTN  who was admitted to the [facility] Medicine Teaching Service at [facility] for CHF exacerbation. His hospital course is detailed below:  CHF Exacerbation Over the past 3 weeks patient has not been compliant with his medications due to nausea and vomiting with taking his meds all at once. Presents with shortness of breath, cough, bilateral lower extremity edema, orthopnea, and PND.  Diuresed with IV Lasix in the ED.  Transitioned back to PO Lasix 40 mg.  Continued Entresto and Metoprolol Succinate during admission, deferred additional GDMT due to history of intolerance and elevated Cr and K in the past. However, discontinued Entresto due to softer BPs during hospitalization.  T2DM Last A1c 6.5 one year ago. Managed on Metformin and Glimepiride at home. Started sensitive SSI during hospitalization with CBG checks. A1c recheck 7.1.   Pleural Effusion R sided pleural effusion present on CXR. Consider XR outpatient to monitor pleural effusion vs consolidation.   Other chronic conditions were medically managed with home medications and formulary alternatives as necessary: HTN: continued Toprol-XL 50 mg daily and discontinued Hydralazine 10 mg TID HLD: continued home Crestor CAD: continued ASA  PCP Follow-up Recommendations: Repeat CXR to monitor pleural effusion/consolidation Held Entresto and Hydralazine due to low blood pressure. Please restart as BP allows Consider transition Glimeperide to  [employer] Cr for AKI  Discharge Diagnoses/Problem List:  Principal Problem:   Acute exacerbation of CHF (congestive heart failure) (HCC) Active Problems:   DM2 (diabetes mellitus, type 2) (HCC)   Primary hypertension   Pleural effusion  Disposition: Home  Discharge Condition: Stable  Discharge Exam:  General: Awake and alert in NAD Cardiovascular: RRR in NSR. No M/R/G. Warm and well perfused Respiratory: CTAB. Normal WOB on RA Abdomen: Soft, non-tender, non-distended. Normoactive bowel sounds. Extremities: no BLE edema  Significant Procedures: none  Significant Labs and Imaging:  Recent Labs  Lab 10/25/22 0307  WBC 8.9  HGB 12.9*  HCT 40.4  PLT 322   Recent Labs  Lab 10/25/22 0307 10/26/22 0257  NA 136 136  K 3.8 3.5  CL 99 99  CO2 27 25  GLUCOSE 147* 135*  BUN 26* 28*  CREATININE 1.73* 1.74*  CALCIUM 8.5* 8.4*  MG 1.9 1.7   Results/Tests Pending at Time of Discharge: none  Discharge Medications:  Allergies as of 10/26/2022       Reactions   Jardiance [empagliflozin] Nausea Only        Medication List     STOP taking these medications    Entresto 24-26 MG Generic drug: sacubitril-valsartan   hydrALAZINE 10 MG tablet Commonly known as: APRESOLINE       TAKE these medications    aspirin EC 81 MG tablet Take 1 tablet (81 mg total) by mouth daily. Swallow whole.   buPROPion 300 MG 24 hr tablet Commonly known as: WELLBUTRIN XL Take 300 mg by mouth every morning.   furosemide 40 MG tablet Commonly known as: LASIX Take 1/2 tablet by mouth on Mondays, Wednesdays, Fridays, take 1 tablet by mouth on  Tuesdays, Thursdays, Saturdays, & Sundays What changed: additional instructions   glimepiride 4 MG tablet Commonly known as: AMARYL Take 4 mg by mouth every morning.   metFORMIN 500 MG tablet Commonly known as: GLUCOPHAGE Take 1,000 mg by mouth every morning.   metoprolol succinate 50 MG 24 hr tablet Commonly known as:  TOPROL-XL Take 1 tablet (50 mg total) by mouth daily. Take with or immediately following a meal.   montelukast 10 MG tablet Commonly known as: SINGULAIR Take 10 mg by mouth every morning.   pantoprazole 40 MG tablet Commonly known as: PROTONIX Take 40 mg by mouth every morning.   rosuvastatin 40 MG tablet Commonly known as: CRESTOR Take 40 mg by mouth daily.        Discharge Instructions: Please refer to Patient Instructions section of EMR for full details.  Patient was counseled important signs and symptoms that should prompt return to medical care, changes in medications, dietary instructions, activity restrictions, and follow up appointments.   Follow-Up Appointments:  Follow-up Information     Eartha Inch, MD. Go on 11/10/2022.   Specialty: Family Medicine Why: @10 :[place] information: [address] [place] [address] [phone]                 Fortunato Curling, DO 10/26/2022, 12:26 PM PGY-1, [facility]  Upper Level Addendum:  I have seen and evaluated this patient along with Dr.  Fatima Blank and reviewed the above note, making necessary revisions as appropriate.  I agree with the medical decision making and physical exam as noted above.  Alfredo Martinez, MD PGY-3 [facility] Family Medicine Residency

## 2022-10-26 NOTE — Progress Notes (Signed)
   Heart Failure Stewardship Pharmacist Progress Note   PCP: Eartha Inch, MD PCP-Cardiologist: Christell Constant, MD    HPI:  82 yo M with PMH of CHF, CAD, prior VT, T2DM, HLD, and HTN.   In 09/2020, ECHO showed LVEF 40-45%. He underwent stress test that showed findings consistent with prior MI, EF <30%. Cardiac cath showed mild CAD. Repeat ECHO 11/2020 showed EF improved to 50-55%. Admitted 03/2021 with acute on chronic CHF. EF 25-30%, thought to be decreased with to ventricular ectopy. Was diuresed and discharged with the addition of spironolactone. Was seen as outpatient for possible ablation but was asymptomatic and deemed not a great candidate.   Presented to the ED on 9/6 with shortness of breath and dry cough x 2 weeks. Reports that he has been vomiting over the past two weeks and hasn't been able to take his lasix. CXR with heterogenous opacities concerning for combination of consolidation and pleural effusion. ECHO 9/8 showed LVEF 40-45%, global hypokinesis, G2DD, RV normal.   Current HF Medications: Diuretic: furosemide 40 mg PO daily  Prior to admission HF Medications: Diuretic: furosemide 40 mg MWF and 20 mg TTh Beta blocker: metoprolol XL 50 mg daily ACE/ARB/ARNI: Entresto 24/26 mg BID  Pertinent Lab Values: Serum creatinine 1.74, BUN 28, Potassium 3.5, Sodium 136, BNP 1438.6, Magnesium 1.7, A1c 7.1   Vital Signs: Weight: 247 lbs (admission weight: 259 lbs) Blood pressure: 130-150/80s  Heart rate: 80s  I/O: net -2L since admission  Medication Assistance / Insurance Benefits Check: Does the patient have prescription insurance?  Yes Type of insurance plan: [facility] Medicare  Outpatient Pharmacy:  [facility] Is the patient willing to use [facility] TOC pharmacy at discharge? Yes Is the patient willing to transition their outpatient pharmacy to utilize a [facility] outpatient pharmacy?   No    Assessment: 1. Acute on chronic systolic  CHF (LVEF 40-45%), due to NICM. NYHA class II symptoms. - Continue furosemide 40 mg PO daily. Strict I/Os and daily weights. Keep K>4 and Mg>2. - PTA metoprolol held for hypotension with IV diuresis. BP improved today. Consider restarting. - PTA Entresto on hold for AKI. Creatinine stable today but remain above baseline.  - Was on spironolactone in the past and was held 2/2 rising creatinine during hospital admission. Never restarted as outpatient. Consider restarting once creatinine improved. - Reports nausea with Jardiance in the past. Consider retrial with Marcelline Deist once creatinine improved.   Plan: 1) Medication changes recommended at this time: - Restart metoprolol XL 50 mg daily  2) Patient assistance: - None pending  3)  Education  - Patient has been educated on current HF medications and potential additions to HF medication regimen - Patient verbalizes understanding that over the next few months, these medication doses may change and more medications may be added to optimize HF regimen - Patient has been educated on basic disease state pathophysiology and goals of therapy   Sharen Hones, PharmD, BCPS Heart Failure Stewardship Pharmacist Phone [phone]

## 2022-10-26 NOTE — Care Management Important Message (Signed)
Important Message  Patient Details  Name: Karl Peters MRN: [id number] Date of Birth: May 29, 1940   Medicare Important Message Given:  Yes     Dorena Bodo 10/26/2022, 2:31 PM

## 2022-10-26 NOTE — Assessment & Plan Note (Deleted)
R sided pleural effusion likely secondary to CHF exacerbation and causing SOB. - Repeat CXR outpatient unless worsening symptoms

## 2022-10-26 NOTE — Assessment & Plan Note (Deleted)
Improving now status post 3 doses IV Lasix 40 mg.  Weight down 6 kg.  Urine output 1.2 L 9/7. Cr bumped 1.39>1.73. Troponins overall flat with reassuring EKG.  Echo LVEF 40-45%, mildly decreased LV function and global hypokinesis. Aortic valve tricuspid. - Restarted 40 mg PO Lasix, monitor Cr on BMP (1.74, baseline 1.2-1.4). - Continue cardiac monitoring, daily weight, strict I/O's - Currently off GDMT given softer BP, consider readding as appropriate - HF clinic consult

## 2022-10-26 NOTE — Progress Notes (Signed)
Physical Therapy Treatment Patient Details Name: Karl Peters MRN: [id number] DOB: 14-Mar-1940 Today's Date: 10/26/2022   History of Present Illness 82 yo male who presented with SOB for 2 weeks and recent N&V. Admitted for CHF exacerbation. PMHx:  HFrEF (30% EF), CAD, diabetes type 2 (not insulin-dependent), dyslipidemia hypertension    PT Comments  Pt sitting EOB upon arrival and agreeable to PT session. Pt is scheduled to d/c today and has no questions about current mobility status. Pt previously ambulated in the hallway with mobility and requested to stay in the room. Pt reports being close to functional mobility baseline. Worked on LE exercises that the pt can do at home in today's session to work on improving fatigue and weakness. Acute PT to follow.     If plan is discharge home, recommend the following: Assistance with cooking/housework;Assist for transportation;Help with stairs or ramp for entrance   Can travel by private vehicle        Equipment Recommendations  None recommended by PT    Recommendations for Other Services       Precautions / Restrictions Precautions Precautions: Fall Restrictions Weight Bearing Restrictions: No     Mobility  Bed Mobility Overal bed mobility:  (nt, sitting EOB upon arrival)                  Transfers Overall transfer level: Needs assistance Equipment used: None Transfers: Sit to/from Stand Sit to Stand: Supervision           General transfer comment: able to stand with no AD, steady    Ambulation/Gait Ambulation/Gait assistance: Supervision Gait Distance (Feet): 8 Feet Assistive device: None Gait Pattern/deviations: WFL(Within Functional Limits), Step-through pattern, Wide base of support       General Gait Details: steady with no AD, ambulated short distance to sink        Balance Overall balance assessment: Mild deficits observed, not formally tested                Cognition Arousal:  Alert Behavior During Therapy: WFL for tasks assessed/performed Overall Cognitive Status: Within Functional Limits for tasks assessed             Exercises General Exercises - Lower Extremity Hip ABduction/ADduction: Standing, AROM, Both, 5 reps Hip Flexion/Marching: AROM, Standing, 10 reps, Both Other Exercises Other Exercises: STS x10 from hospital bed, B standing hip ext. x10    General Comments General comments (skin integrity, edema, etc.): VSS on room air      Pertinent Vitals/Pain Pain Assessment Pain Assessment: No/denies pain           PT Goals (current goals can now be found in the care plan section) Progress towards PT goals: Progressing toward goals    Frequency    Min 1X/week       AM-PAC PT "6 Clicks" Mobility   Outcome Measure  Help needed turning from your back to your side while in a flat bed without using bedrails?: None Help needed moving from lying on your back to sitting on the side of a flat bed without using bedrails?: None Help needed moving to and from a bed to a chair (including a wheelchair)?: A Little Help needed standing up from a chair using your arms (e.g., wheelchair or bedside chair)?: A Little Help needed to walk in hospital room?: A Little Help needed climbing 3-5 steps with a railing? : A Little 6 Click Score: 20    End of Session   Activity Tolerance:  Patient tolerated treatment well Patient left: in bed;with call bell/phone within reach (bed alarm off upon arrival) Nurse Communication: Mobility status PT Visit Diagnosis: Unsteadiness on feet (R26.81);Muscle weakness (generalized) (M62.81)     Time: 3086-5784 PT Time Calculation (min) (ACUTE ONLY): 15 min  Charges:    $Therapeutic Exercise: 8-22 mins PT General Charges $$ ACUTE PT VISIT: 1 Visit                    Hilton Cork, PT, DPT Secure Chat Preferred  Rehab Office [phone]    Arturo Morton Brion Aliment 10/26/2022, 1:04 PM

## 2022-10-26 NOTE — Progress Notes (Signed)
Mobility Specialist Progress Note:    10/26/22 1231  Mobility  Activity Ambulated independently in hallway  Level of Assistance Modified independent, requires aide device or extra time  Assistive Device None  Distance Ambulated (ft) 300 ft  Activity Response Tolerated well  Mobility Referral Yes  $Mobility charge 1 Mobility  Mobility Specialist Start Time (ACUTE ONLY) 1225  Mobility Specialist Stop Time (ACUTE ONLY) 1232  Mobility Specialist Time Calculation (min) (ACUTE ONLY) 7 min   Received pt in bed having no complaints and agreeable to mobility. Pt was asymptomatic throughout ambulation and returned to room w/o fault. Left in bed w/ call bell in reach and all needs met.   Thompson Grayer Mobility Specialist  Please contact vis Secure Chat or  Rehab Office [phone]

## 2022-11-04 ENCOUNTER — Encounter (HOSPITAL_COMMUNITY): Payer: Medicare Other

## 2022-11-19 ENCOUNTER — Ambulatory Visit (HOSPITAL_COMMUNITY)
Admission: RE | Admit: 2022-11-19 | Discharge: 2022-11-19 | Disposition: A | Discharge status: Home / Self Care | Payer: Medicare Other | Source: Ambulatory Visit | Attending: Adult Health | Admitting: Adult Health

## 2022-11-19 VITALS — BP 149/98 | HR 91 | Wt 238.0 lb

## 2022-11-19 DIAGNOSIS — I428 Other cardiomyopathies: Secondary | ICD-10-CM | POA: Insufficient documentation

## 2022-11-19 DIAGNOSIS — E1122 Type 2 diabetes mellitus with diabetic chronic kidney disease: Secondary | ICD-10-CM | POA: Diagnosis not present

## 2022-11-19 DIAGNOSIS — I130 Hypertensive heart and chronic kidney disease with heart failure and stage 1 through stage 4 chronic kidney disease, or unspecified chronic kidney disease: Secondary | ICD-10-CM | POA: Diagnosis not present

## 2022-11-19 DIAGNOSIS — R9431 Abnormal electrocardiogram [ECG] [EKG]: Secondary | ICD-10-CM | POA: Insufficient documentation

## 2022-11-19 DIAGNOSIS — N1831 Chronic kidney disease, stage 3a: Secondary | ICD-10-CM | POA: Diagnosis not present

## 2022-11-19 DIAGNOSIS — R110 Nausea: Secondary | ICD-10-CM | POA: Insufficient documentation

## 2022-11-19 DIAGNOSIS — I493 Ventricular premature depolarization: Secondary | ICD-10-CM | POA: Diagnosis not present

## 2022-11-19 DIAGNOSIS — E785 Hyperlipidemia, unspecified: Secondary | ICD-10-CM | POA: Diagnosis not present

## 2022-11-19 DIAGNOSIS — I5020 Unspecified systolic (congestive) heart failure: Secondary | ICD-10-CM

## 2022-11-19 DIAGNOSIS — I5022 Chronic systolic (congestive) heart failure: Secondary | ICD-10-CM | POA: Insufficient documentation

## 2022-11-19 DIAGNOSIS — I4729 Other ventricular tachycardia: Secondary | ICD-10-CM | POA: Diagnosis not present

## 2022-11-19 DIAGNOSIS — I2510 Atherosclerotic heart disease of native coronary artery without angina pectoris: Secondary | ICD-10-CM | POA: Insufficient documentation

## 2022-11-19 DIAGNOSIS — E119 Type 2 diabetes mellitus without complications: Secondary | ICD-10-CM | POA: Diagnosis present

## 2022-11-19 DIAGNOSIS — I10 Essential (primary) hypertension: Secondary | ICD-10-CM

## 2022-11-19 DIAGNOSIS — I509 Heart failure, unspecified: Secondary | ICD-10-CM | POA: Diagnosis present

## 2022-11-19 LAB — BASIC METABOLIC PANEL
Anion gap: 10 (ref 5–15)
BUN: 17 mg/dL (ref 8–23)
CO2: 30 mmol/L (ref 22–32)
Calcium: 8.9 mg/dL (ref 8.9–10.3)
Chloride: 97 mmol/L — ABNORMAL LOW (ref 98–111)
Creatinine, Ser: 1.5 mg/dL — ABNORMAL HIGH (ref 0.61–1.24)
GFR, Estimated: 47 mL/min — ABNORMAL LOW (ref >60)
Glucose, Bld: 114 mg/dL — ABNORMAL HIGH (ref 70–99)
Potassium: 4.8 mmol/L (ref 3.5–5.1)
Sodium: 137 mmol/L (ref 135–145)

## 2022-11-19 LAB — BRAIN NATRIURETIC PEPTIDE: B Natriuretic Peptide: 630.7 pg/mL — ABNORMAL HIGH (ref 0.0–100.0)

## 2022-11-19 MED ORDER — FUROSEMIDE 40 MG PO TABS: 40.0000 mg | ORAL_TABLET | Freq: Every day | ORAL | Status: DC

## 2022-11-19 MED ORDER — METOPROLOL SUCCINATE ER 25 MG PO TB24: 25.0000 mg | ORAL_TABLET | Freq: Every day | ORAL | 1 refills | Status: DC

## 2022-11-19 NOTE — Progress Notes (Signed)
HEART & VASCULAR TRANSITION OF CARE CONSULT NOTE     Referring Physician: Dr Jena Gauss Primary Care: Dr Cyndia Bent  Primary Cardiologist: Dr Timoteo Gaul EP: Dr Ladona Ridgel    HPI: Referred to clinic by Dr Jena Gauss for heart failure consultation.   [name] is a 82 year old with a history of HFmEF,  CAD, HLD, PVCs, and DMII. Of note he is intolerant jardiance.   Admitted 10/23/22 with A/C HFrEF. He had been off medications for 2 1/2 week due to nausea and vomiting. Diuresed with IV lasix and transitioned to lasix 40 mg daily. Overall diuresed 12 pounds. Entresto stopped due to hypotension and hyperkalemia. Placed on Toprol XL for GDMT. Discharge weight 247 pounds.   He cancelled his post hospital appointment with PCP because he was to weak.    Overall feeling a little better. Denies nausea. Says it comes and goes. He is not short of breath as long as he takes his time. Denies PND/Orthopnea. Coughs a little bit at night but as been getting better. Appetite has been poor. Says he has been following a low salt diet.  No fever or chills. Weight at home  trending down form 245-->230 pounds. He has been taking lasix 40 mg daily. He has not been taking any other medications. Lives with his wife and daughter.   Cardiac Testing Reviewed  Echo 10/2022  Poor image quality EF based on images with definity. EF improved  compared to TTE done 04/11/21 Global hypokinesis abnormal septal motion .  Left ventricular ejection fraction, by estimation, is 40 to 45%. The left  ventricle has mildly decreased  function. The left ventricle demonstrates global hypokinesis. The left  ventricular internal cavity size was moderately dilated. Left ventricular  diastolic parameters are consistent with Grade II diastolic dysfunction  (pseudonormalization). Elevated left  ventricular end-diastolic pressure.   2. Right ventricular systolic function is normal. The right ventricular  size is normal.   3. Left atrial size was  moderately dilated.   4. The mitral valve is abnormal. No evidence of mitral valve  regurgitation. No evidence of mitral stenosis. Moderate mitral annular  calcification.   5. The aortic valve is tricuspid. Aortic valve regurgitation is not  visualized. No aortic stenosis is present.   Echo 2023  1. Global hypokinesis worse in the inferoseptal, inferior and  inferolateral myocarium. Left ventricular ejection fraction, by  estimation, is 25 to 30%. The left ventricle has severely decreased  function. The left ventricle demonstrates global  hypokinesis. There is mild concentric left ventricular hypertrophy.   2. Right ventricular systolic function is normal. The right ventricular  size is normal.   3. Left atrial size was moderately dilated.   4. The mitral valve is normal in structure. No evidence of mitral valve  regurgitation. No evidence of mitral stenosis. Moderate mitral annular  calcification.   5. The aortic valve is tricuspid. Aortic valve regurgitation is not  visualized. No aortic stenosis is present.   6. Aortic dilatation noted. There is borderline dilatation of the  ascending aorta, measuring 36 mm.   LHC 2022  No significant obstructive coronary disease in the major arteries.   LAD with segmental 50% mid stenosis.  Second diagonal with 70% ostial to proximal narrowing.  Vessel covers a very small territory and bifurcates proximally.   Codominant widely patent RCA   30% proximal ramus intermedius.   Codominant right coronary without significant obstructi Past Medical History:  Diagnosis Date   Acute on chronic diastolic  CHF (congestive heart failure), NYHA class 3 (HCC) 04/10/2021   Diabetes mellitus    Dyslipidemia    Hx of cardiac catheterization    a. normal cors by cath in 1998 and 2010   Hx of echocardiogram    Echocardiogram 06/10/12: Normal LV wall thickness, EF 50-55%, grade 1 diastolic dysfunction, mild LAE   Hypertension    Obesity    Ventricular  tachycardia (HCC)    and symptomatic PVCs - Rx with beta blocker    Current Outpatient Medications  Medication Sig Dispense Refill   furosemide (LASIX) 40 MG tablet Take 1 tablet by mouth on Mondays, Wednesdays, Fridays, take 1/2 tablet by mouth on Tuesdays, Thursdays; none on Sat and Sun     aspirin EC 81 MG tablet Take 1 tablet (81 mg total) by mouth daily. Swallow whole. (Patient not taking: Reported on 11/19/2022)     buPROPion (WELLBUTRIN XL) 300 MG 24 hr tablet Take 300 mg by mouth every morning. (Patient not taking: Reported on 11/19/2022)     glimepiride (AMARYL) 4 MG tablet Take 4 mg by mouth every morning. (Patient not taking: Reported on 11/19/2022)     metFORMIN (GLUCOPHAGE) 500 MG tablet Take 1,000 mg by mouth every morning. (Patient not taking: Reported on 11/19/2022)     metoprolol succinate (TOPROL-XL) 50 MG 24 hr tablet Take 1 tablet (50 mg total) by mouth daily. Take with or immediately following a meal. (Patient not taking: Reported on 11/19/2022) 30 tablet 1   montelukast (SINGULAIR) 10 MG tablet Take 10 mg by mouth every morning. (Patient not taking: Reported on 11/19/2022)     pantoprazole (PROTONIX) 40 MG tablet Take 40 mg by mouth every morning. (Patient not taking: Reported on 11/19/2022)     rosuvastatin (CRESTOR) 40 MG tablet Take 40 mg by mouth daily. (Patient not taking: Reported on 11/19/2022)     No current facility-administered medications for this encounter.    Allergies  Allergen Reactions   Jardiance [Empagliflozin] Nausea Only      Social History   Socioeconomic History   Marital status: Single    Spouse name: Not on file   Number of children: Not on file   Years of education: Not on file   Highest education level: Not on file  Occupational History   Not on file  Tobacco Use   Smoking status: Never   Smokeless tobacco: Never  Substance and Sexual Activity   Alcohol use: No   Drug use: Never   Sexual activity: Not on file  Other Topics Concern    Not on file  Social History Narrative   Not on file   Social Determinants of Health   Financial Resource Strain: Low Risk  (10/24/2021)   Received from [facility], [facility]   Overall Financial Resource Strain (CARDIA)    Difficulty of Paying Living Expenses: Not hard at all  Food Insecurity: No Food Insecurity (10/24/2022)   Hunger Vital Sign    Worried About Running Out of Food in the Last Year: Never true    Ran Out of Food in the Last Year: Never true  Transportation Needs: No Transportation Needs (10/24/2022)   PRAPARE - Administrator, Civil Service (Medical): No    Lack of Transportation (Non-Medical): No  Physical Activity: Unknown (10/24/2021)   Received from [facility]   Exercise Vital Sign    Days of Exercise per Week: 0 days    Minutes of Exercise per Session: Not on  file  Stress: No Stress Concern Present (10/24/2021)   Received from [facility] - Occupational Stress Questionnaire    Feeling of Stress : Not at all  Social Connections: Unknown (10/25/2022)   Received from [facility]   Social Network    Social Network: Not on file  Intimate Partner Violence: Unknown (10/25/2022)   Received from [facility]   HITS    Physically Hurt: Not on file    Insult or Talk Down To: Not on file    Threaten Physical Harm: Not on file    Scream or Curse: Not on file      Family History  Adopted: Yes  Problem Relation Age of Onset   Heart failure Mother 82       Died from heart failure   Lymphoma Mother    Diabetes Mother    Heart attack Father 73       Died at age 57 from heart attack   Diabetes Mellitus II Maternal Grandmother    Other Maternal Grandfather        head injury   Prostate cancer Paternal Grandfather    Depression Daughter    Rheum arthritis Daughter    Anxiety disorder Daughter     Vitals:   11/19/22 1515  BP: (!) 149/98  Pulse: 91  SpO2: 95%  Weight: 108  kg (238 lb)   Wt Readings from Last 3 Encounters:  11/19/22 108 kg (238 lb)  10/26/22 112.1 kg (247 lb 1.6 oz)  09/17/21 117.9 kg (260 lb)    PHYSICAL EXAM: General:  Walked in the clinic.  No respiratory difficulty HEENT: normal Neck: supple. JVP 7-8 . Carotids 2+ bilat; no bruits. No lymphadenopathy or thryomegaly appreciated. Cor: PMI nondisplaced. Regular rate & rhythm. No rubs, gallops or murmurs. Lungs: clear Abdomen: soft, nontender, nondistended. No hepatosplenomegaly. No bruits or masses. Good bowel sounds. Extremities: no cyanosis, clubbing, rash, R and LLE around his ankles 1+ edema Neuro: alert & oriented x 3, cranial nerves grossly intact. moves all 4 extremities w/o difficulty. Affect pleasant.  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 Chronic HFrEF , NICM Most recent Echo showed EF improved to EF 40-45% from 25-30% .  LHC - 2022 - no significant CAD:  LAD with segmental 50% mid stenosis.  Second diagonal with 70% ostial to proximal narrowing.  Vessel covers a very small territory and bifurcates proximally. RCA patent,  NYHA II. Reds Clip 37%. Volume status stable.  GDMT  Diuretic- Continue current diuretic regimen. He has been taking Lasix 40 mg daily which is a higher dose than ordered. Change to lasix 40 mg daily.   BB- Restart Toprol XL 25 mg mg daily  which is lower than previous dose.  Ace/ARB/ARNI- hold for now recent AKI MRA- hold for now recent AKI SGLT2i- Intolerant due to nausea.  Ideally would restart entresto but favor doing one thing at a time given he has only been taking lasix.  2. CKD Stage IIIa  Recent creatinine 1.4-1.6  Chck BMET   3. DMII  Hgb A1C 7.1   4. Nausea  Needs follow up with PCP   5. History of PVCs /NSVT He needs to start taking Toprol XL 25  mg daily. We discussed.   6. HTN  Elevated. Restart Toprol XL 25 mg daily.  Consider adding entresto down the road.   Discussed medication regimen at length.  Would  aim to get back on entresto down the road.   Referred to HFSW (PCP, Medications, Transportation, ETOH Abuse, Drug Abuse, Insurance, Financial ):  No Refer to Pharmacy: No Refer to Home Health:  No Refer to Advanced Heart Failure Clinic:  no  Refer to General Cardiology: Back to general cardiology with Dr Izora Ribas   Follow up as needed.   Hezekiah Veltre NP-C  3:49 PM

## 2022-11-19 NOTE — Patient Instructions (Addendum)
CHANGE Lasix to 40 mg daily.  RESTART Metoprol XL 25 mg daily.  Labs done today, your results will be available in MyChart, we will contact you for abnormal readings.  Thank you for allowing [place] to provider your heart failure care after your recent hospitalization. Please follow-up with Dr. Izora Ribas in the next few weeks.  If you have any questions, issues, or concerns before your next appointment please call our office at [phone], opt. [phone] and leave a message for the triage nurse.

## 2022-11-19 NOTE — Progress Notes (Signed)
ReDS Vest / Clip - 11/19/22 1500       ReDS Vest / Clip   Station Marker C    Ruler Value 28    ReDS Value Range Moderate volume overload    ReDS Actual Value 37

## 2022-11-26 IMAGING — CT CT FOREARM*L* W/O CM
2 series · 15 of 28 positions shown, 19 images · non-contrast
Comparison: None.

CLINICAL DATA: septic thrombophlebitis, rule out abscess

EXAM:
CT OF THE UPPER LEFT EXTREMITY WITHOUT CONTRAST
TECHNIQUE: Multidetector CT imaging of the upper left extremity was performed
according to the standard protocol.

[Series 4: upper ext 1.5 st · axial · 0.68mm/px · z∈[+724,+916]mm · 10 of 152 slices shown, 13 images]
[im 12/152  soft-tissue]
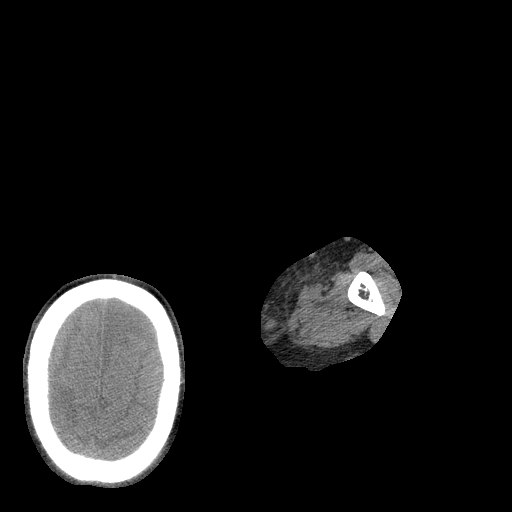
[im 12/152  bone]
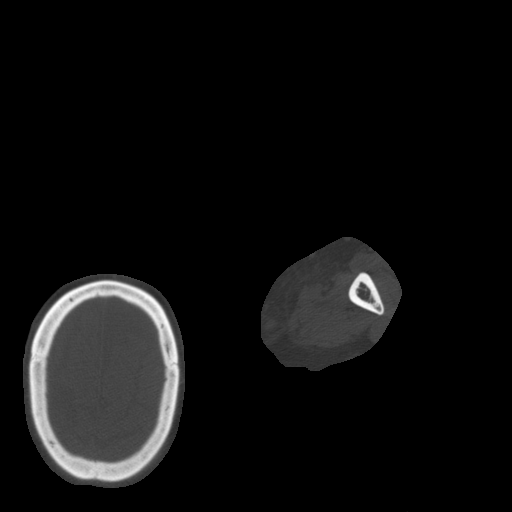
[im 24/152  bone]
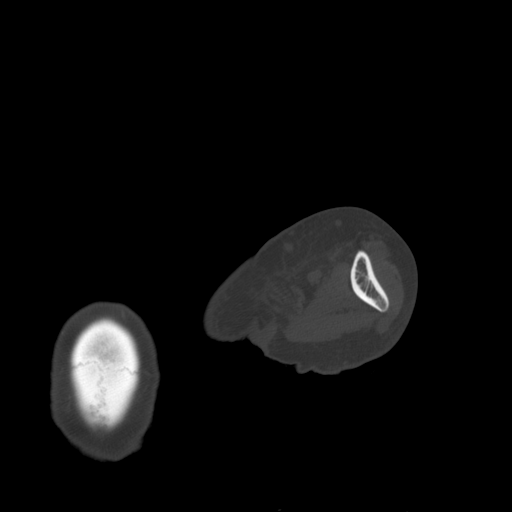
[im 47/152  bone]
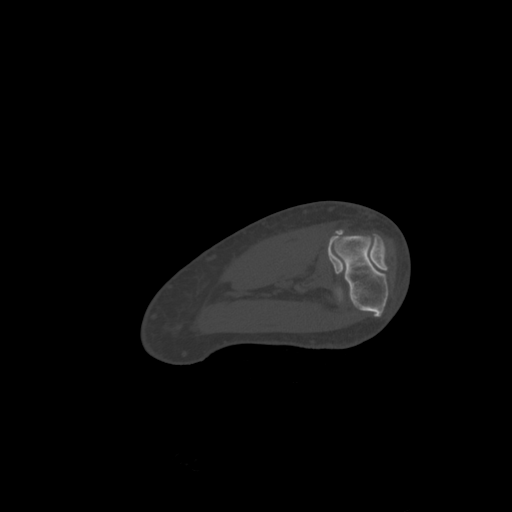
[im 59/152  bone]
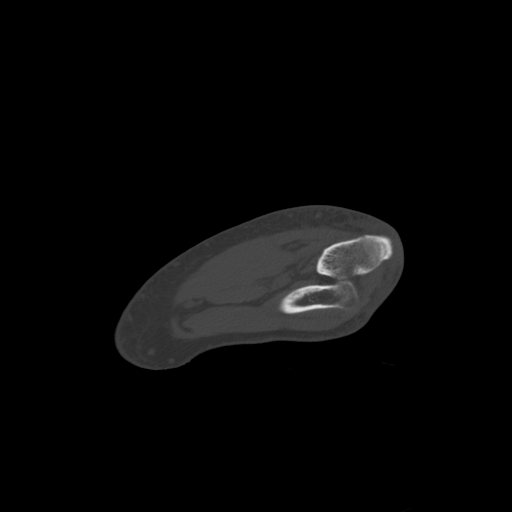
[im 70/152  soft-tissue]
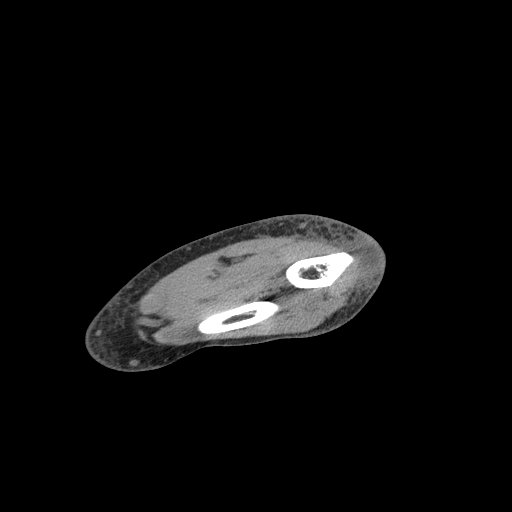
[im 70/152  bone]
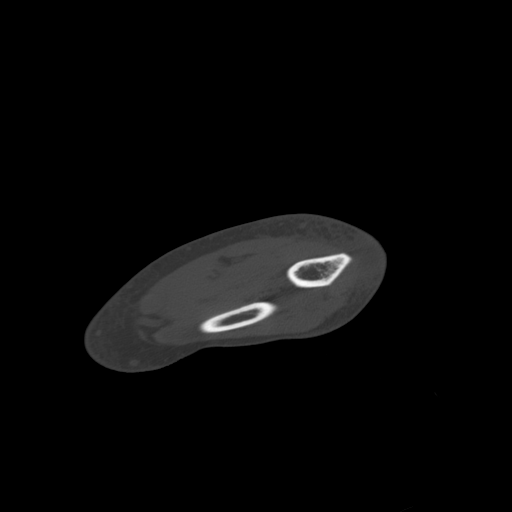
[im 82/152  bone]
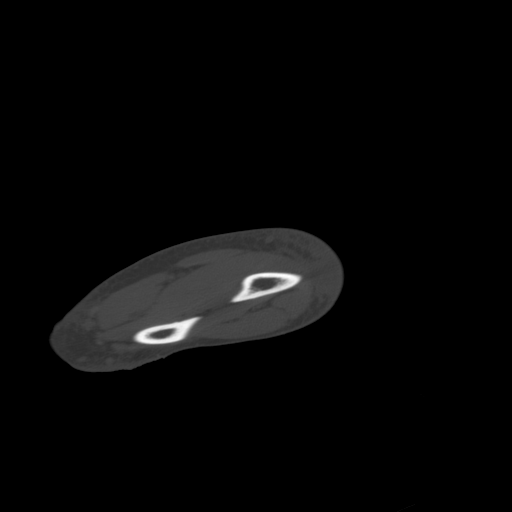
[im 93/152  bone]
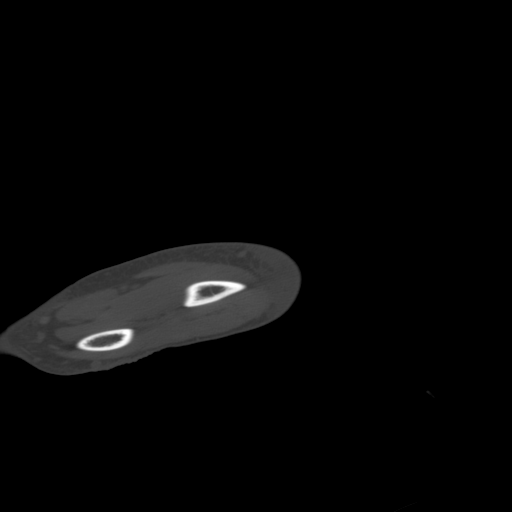
[im 117/152  bone]
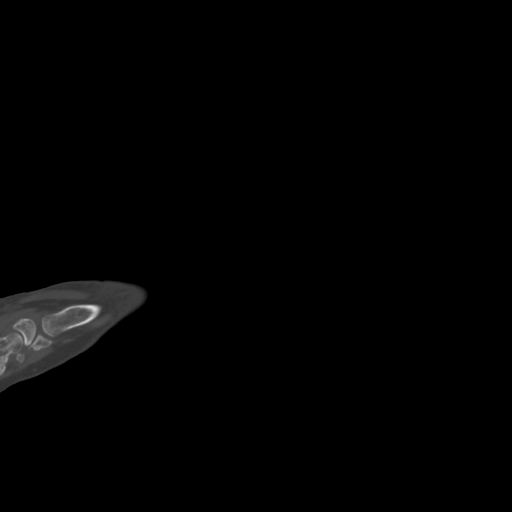
[im 128/152  soft-tissue]
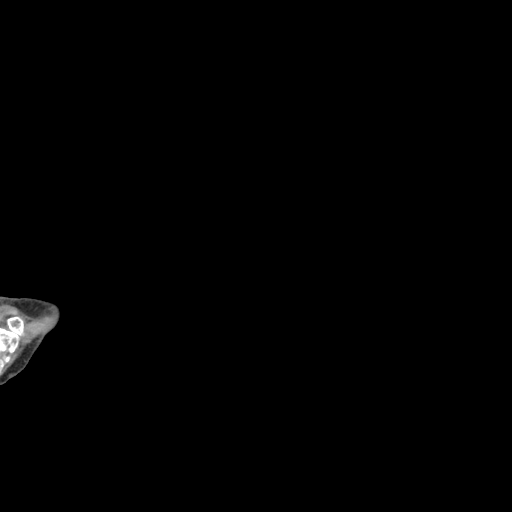
[im 128/152  bone]
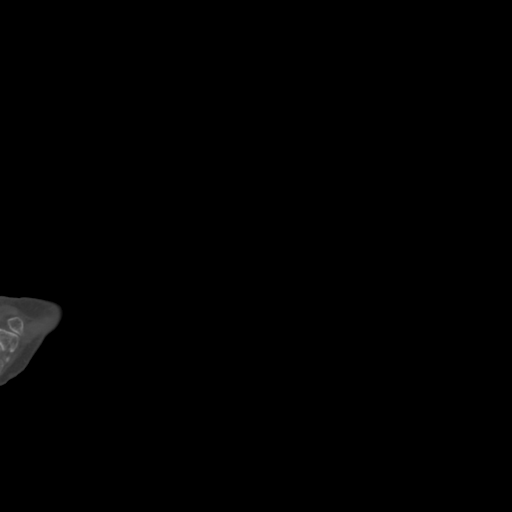
[im 140/152  bone]
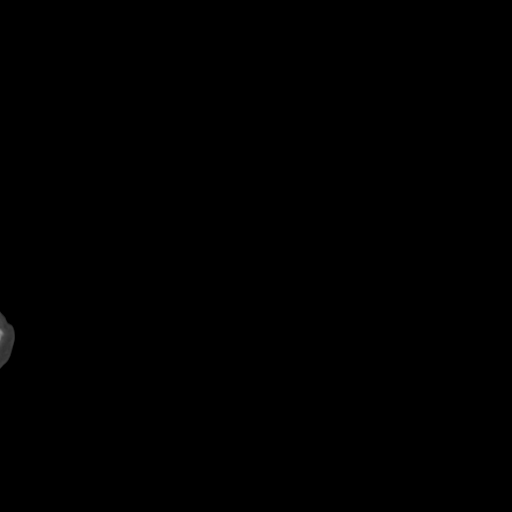

[Series 11: bone true ax · sagittal · 0.31mm/px · 5 of 218 slices shown, 6 images]
[im 95/218  bone]
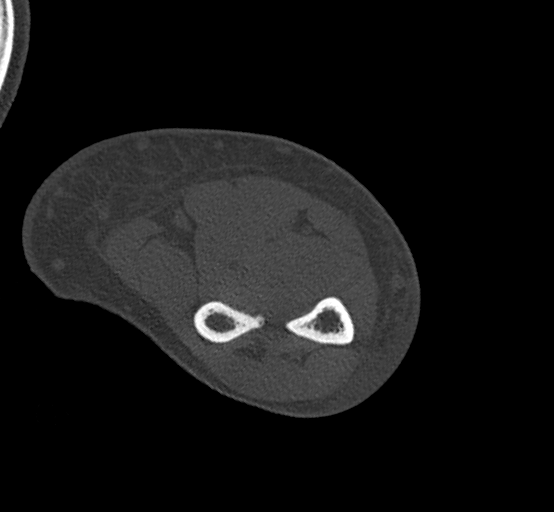
[im 102/218  bone]
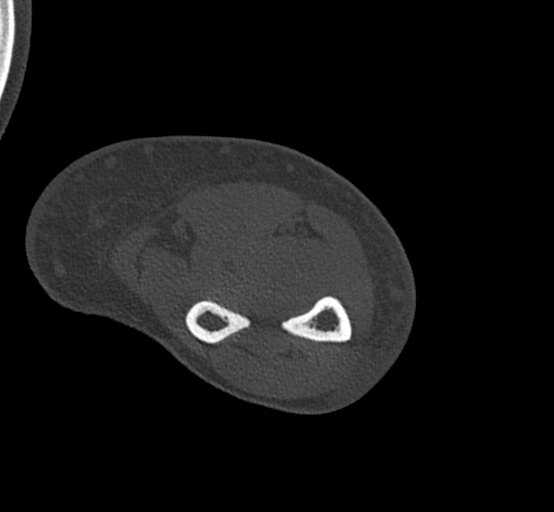
[im 109/218  soft-tissue]
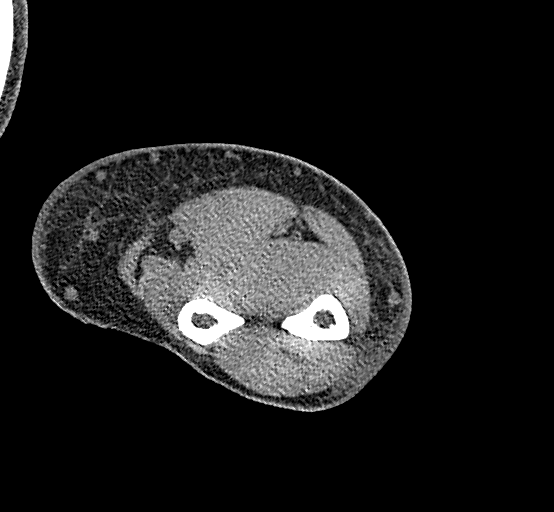
[im 109/218  bone]
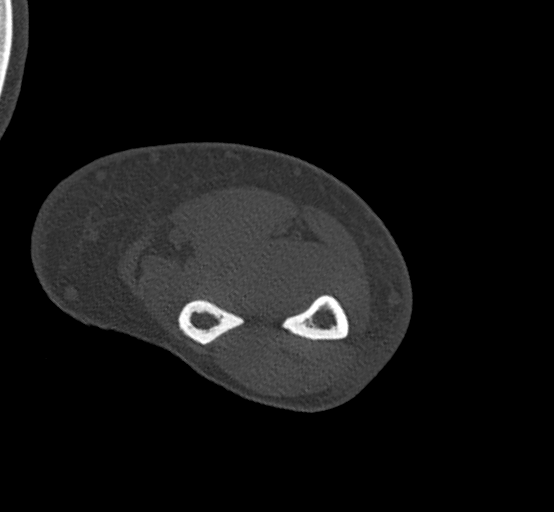
[im 116/218  bone]
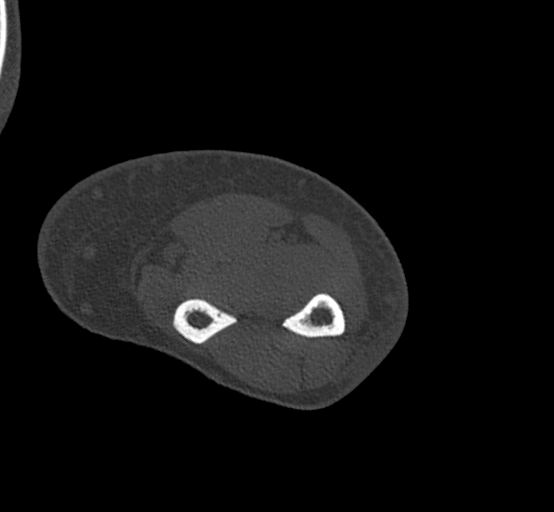
[im 123/218  bone]
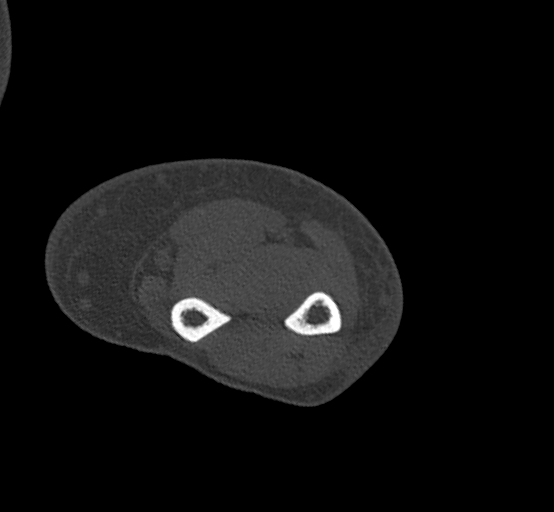

[15 of 28 positions shown; findings below may reference images not displayed]

FINDINGS: Bones/Joint/Cartilage

There is no evidence of acute fracture. There is mild glenohumeral,
radiocapitellar, and ulnotrochlear osteoarthritis. There is no
glenohumeral joint effusion. There is no elbow joint effusion. There
is widening of the scapholunate interval and moderate radiocarpal
osteoarthritis. There is severe first carpometacarpal joint
osteoarthritis.

Ligaments

Suboptimally assessed by CT.

Muscles and Tendons

There is no visible intramuscular fluid collection. There is no
muscle atrophy.

Soft tissues

There is skin thickening and subcutaneous soft tissue swelling along
the upper arm, the elbow predominantly posteriorly, and along the
forearm. There is no well-defined fluid collection on noncontrast
CT. There is no soft tissue gas.
IMPRESSION: Skin thickening and subcutaneous swelling along the left upper arm
and the forearm. No evidence of drainable fluid collection on
noncontrast CT. No acute osseous abnormality.

Widening of the scapholunate interval suggesting scapholunate
ligament insufficiency/tear, with moderate-severe radiocarpal
arthritis. Additional degenerative joint disease the shoulder,
elbow, and wrist as described above.

## 2022-11-26 IMAGING — CT CT HUMERUS*L* W/O CM
3 series · 10 of 35 positions shown, 11 images · non-contrast
Comparison: None.

CLINICAL DATA: septic thrombophlebitis, rule out abscess

EXAM:
CT OF THE UPPER LEFT EXTREMITY WITHOUT CONTRAST
TECHNIQUE: Multidetector CT imaging of the upper left extremity was performed
according to the standard protocol.

[Series 4: upper ext 1.5 st · axial · 0.44mm/px · z∈[+546,+711]mm · 2 of 238 slices shown, 3 images]
[im 73/238  soft-tissue]
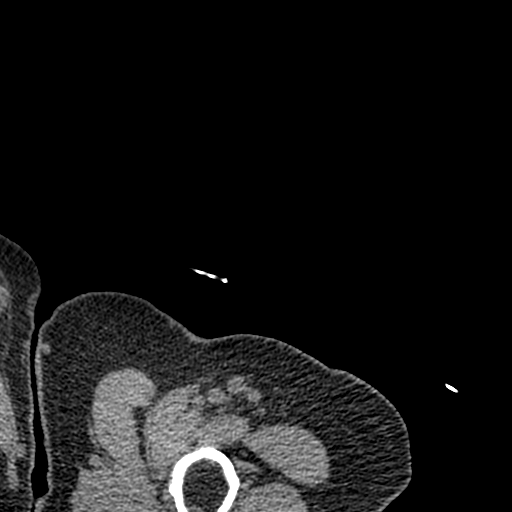
[im 73/238  bone]
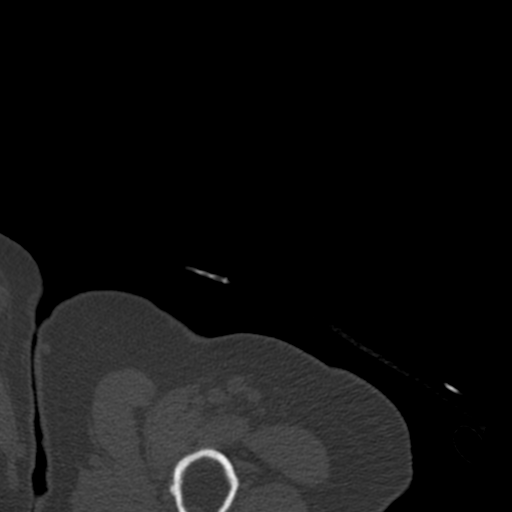
[im 183/238  bone]
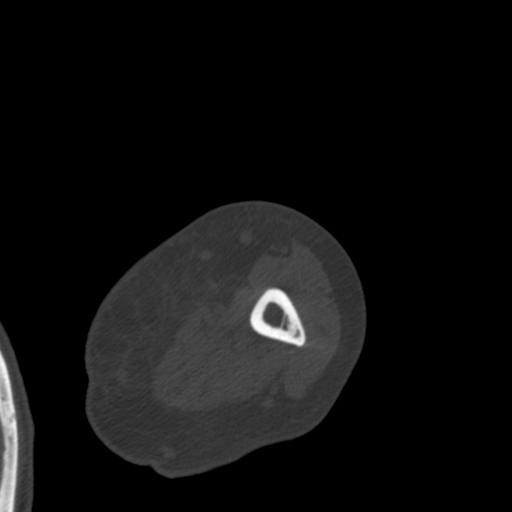

[Series 9: upper ext cor st · coronal · 0.38mm/px · 3 of 139 slices shown]
[im 38/139  bone]
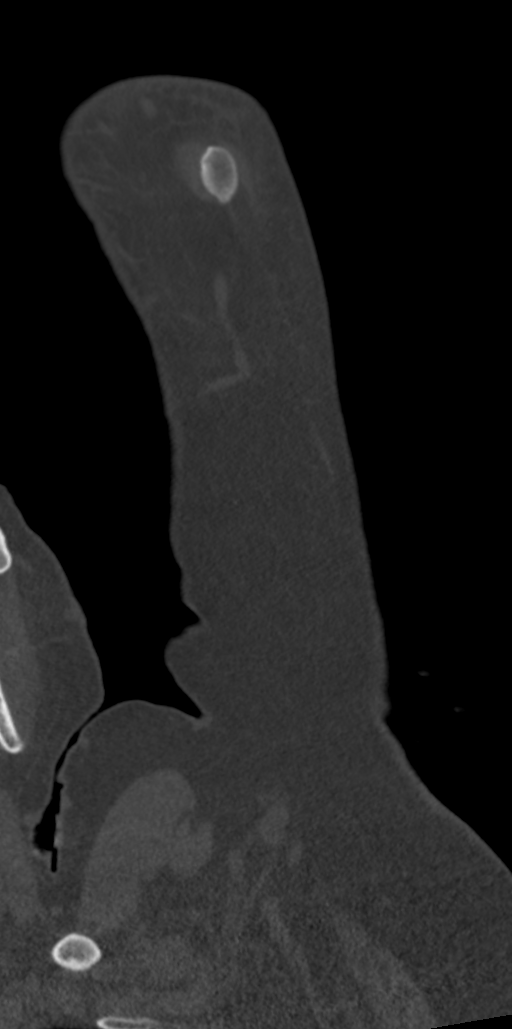
[im 59/139  bone]
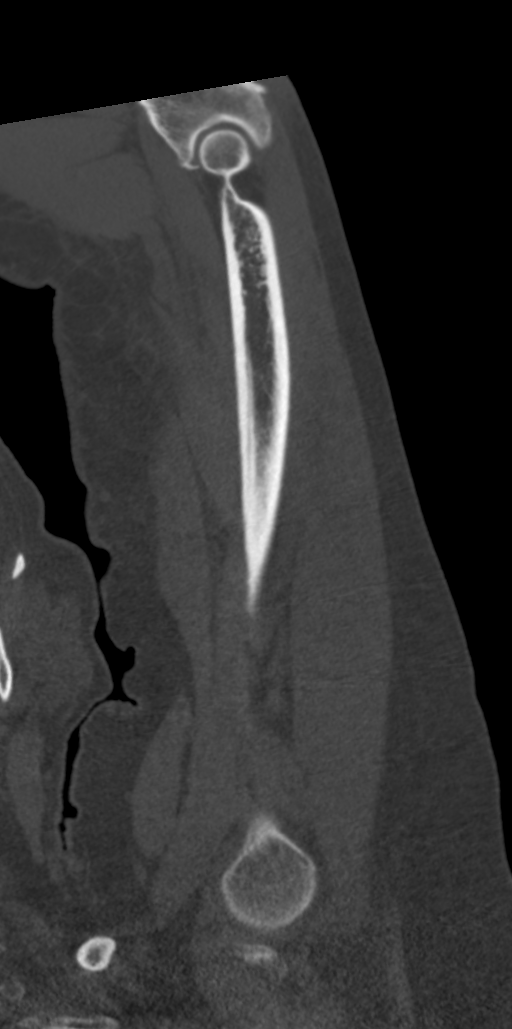
[im 80/139  bone]
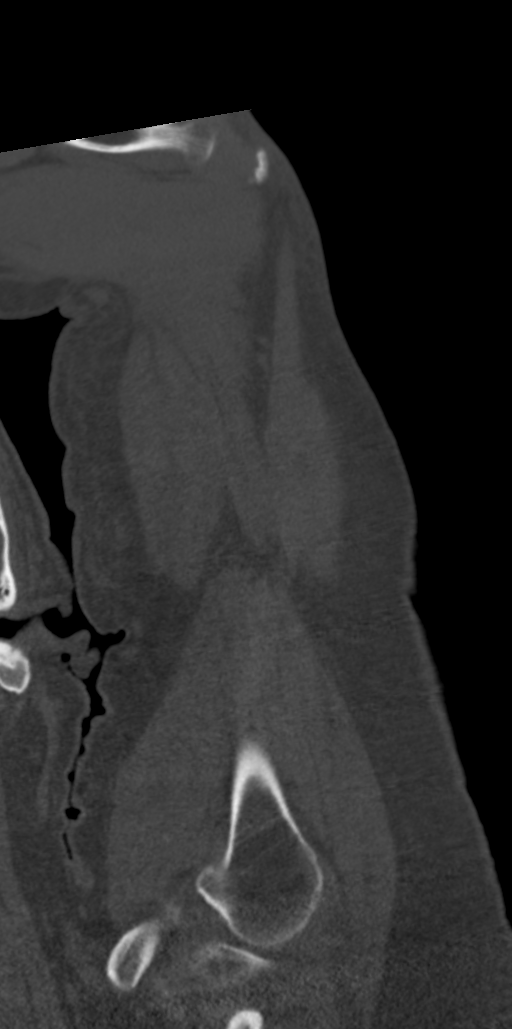

[Series 10: upper ext sag st · sagittal · 0.33mm/px · 5 of 150 slices shown]
[im 50/150  bone]
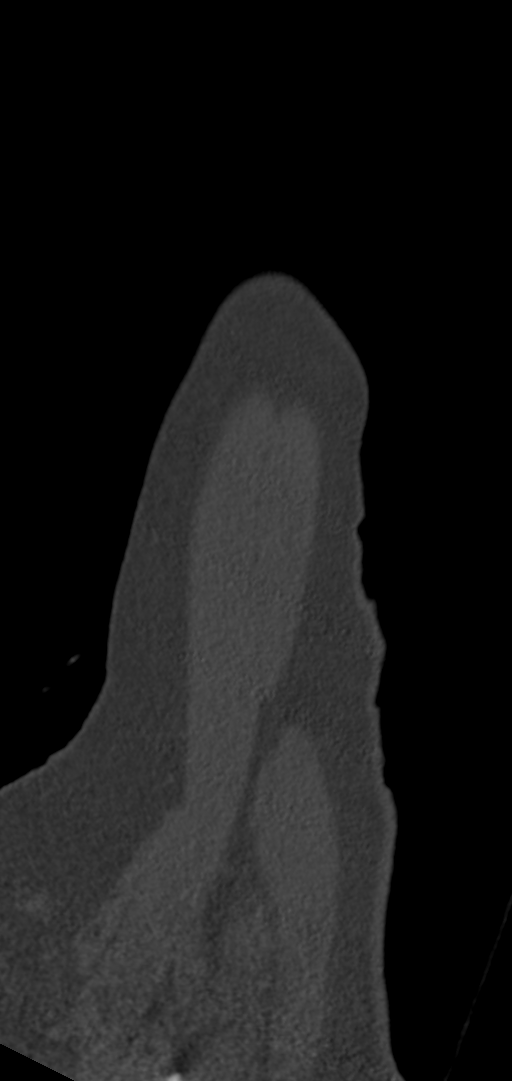
[im 63/150  bone]
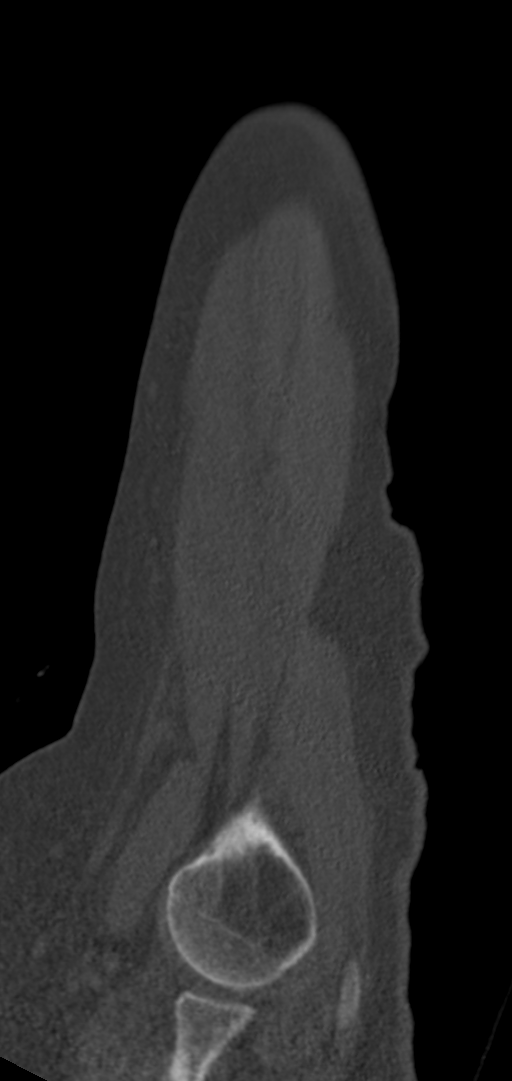
[im 75/150  bone]
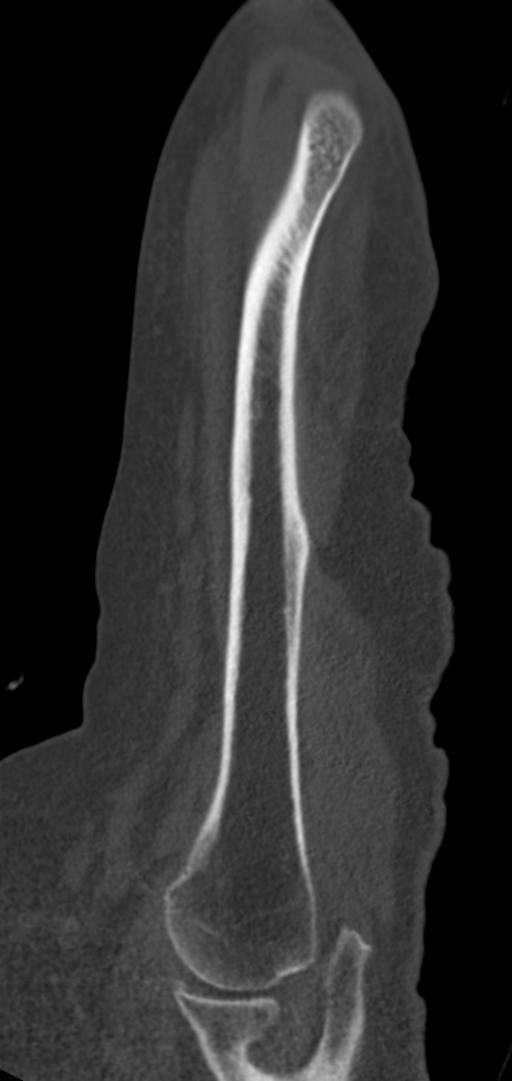
[im 87/150  bone]
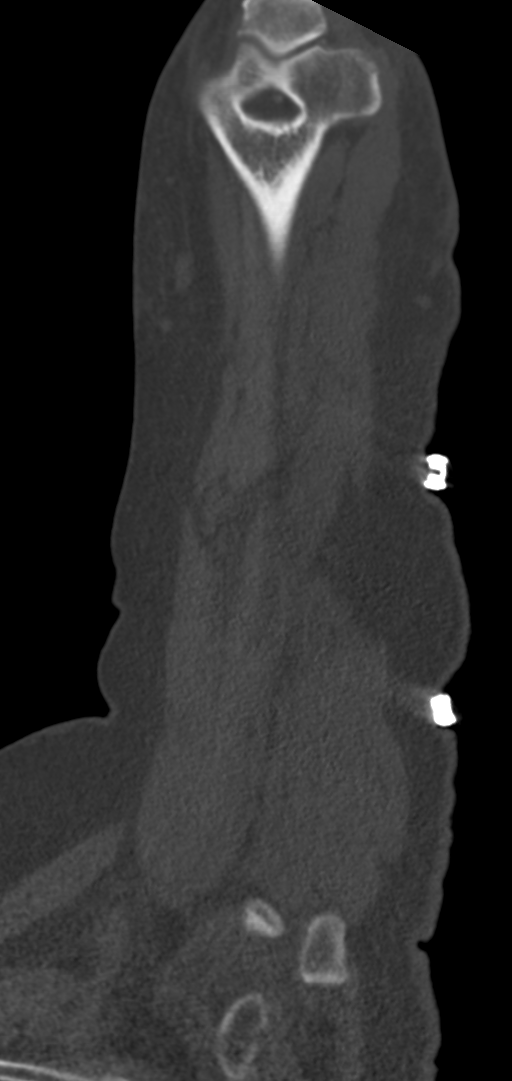
[im 100/150  bone]
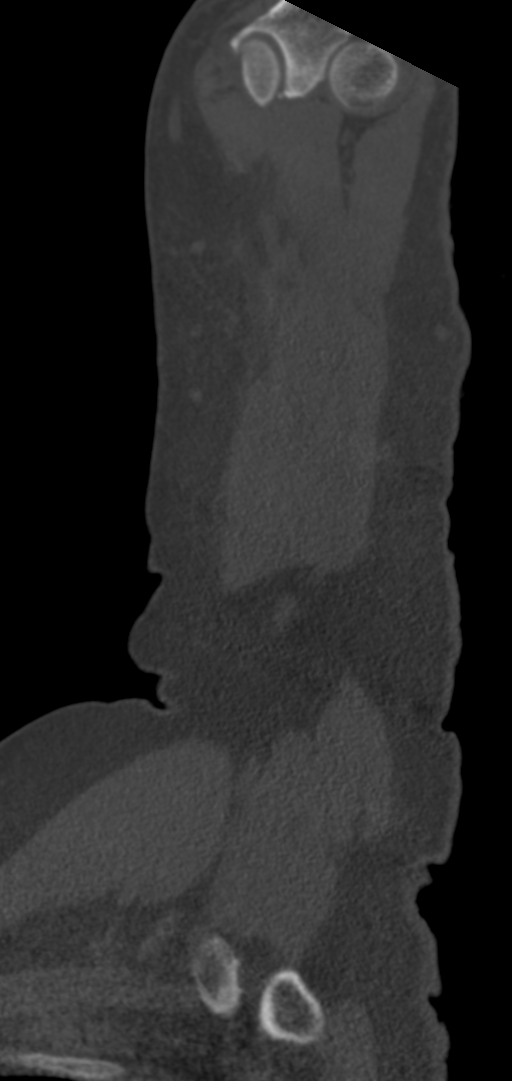

[10 of 35 positions shown; findings below may reference images not displayed]

FINDINGS: Bones/Joint/Cartilage

There is no evidence of acute fracture. There is mild glenohumeral,
radiocapitellar, and ulnotrochlear osteoarthritis. There is no
glenohumeral joint effusion. There is no elbow joint effusion. There
is widening of the scapholunate interval and moderate radiocarpal
osteoarthritis. There is severe first carpometacarpal joint
osteoarthritis.

Ligaments

Suboptimally assessed by CT.

Muscles and Tendons

There is no visible intramuscular fluid collection. There is no
muscle atrophy.

Soft tissues

There is skin thickening and subcutaneous soft tissue swelling along
the upper arm, the elbow predominantly posteriorly, and along the
forearm. There is no well-defined fluid collection on noncontrast
CT. There is no soft tissue gas.
IMPRESSION: Skin thickening and subcutaneous swelling along the left upper arm
and the forearm. No evidence of drainable fluid collection on
noncontrast CT. No acute osseous abnormality.

Widening of the scapholunate interval suggesting scapholunate
ligament insufficiency/tear, with moderate-severe radiocarpal
arthritis. Additional degenerative joint disease the shoulder,
elbow, and wrist as described above.

## 2022-12-11 ENCOUNTER — Other Ambulatory Visit (HOSPITAL_COMMUNITY): Payer: Self-pay | Admitting: Adult Health

## 2023-02-26 ENCOUNTER — Other Ambulatory Visit: Payer: Self-pay | Admitting: Student

## 2023-03-24 ENCOUNTER — Other Ambulatory Visit: Payer: Self-pay | Admitting: Internal Medicine

## 2023-05-02 ENCOUNTER — Other Ambulatory Visit: Payer: Self-pay | Admitting: Internal Medicine

## 2023-05-20 ENCOUNTER — Other Ambulatory Visit: Payer: Self-pay | Admitting: Internal Medicine

## 2023-05-24 IMAGING — CR DG CHEST 2V
2 series · 2 of 2 positions shown · non-contrast
Comparison: None.

CLINICAL DATA: SOB x 1 week with fatigue during exertion.

EXAM:
CHEST - 2 VIEW

[chest lat (1 of 2)]
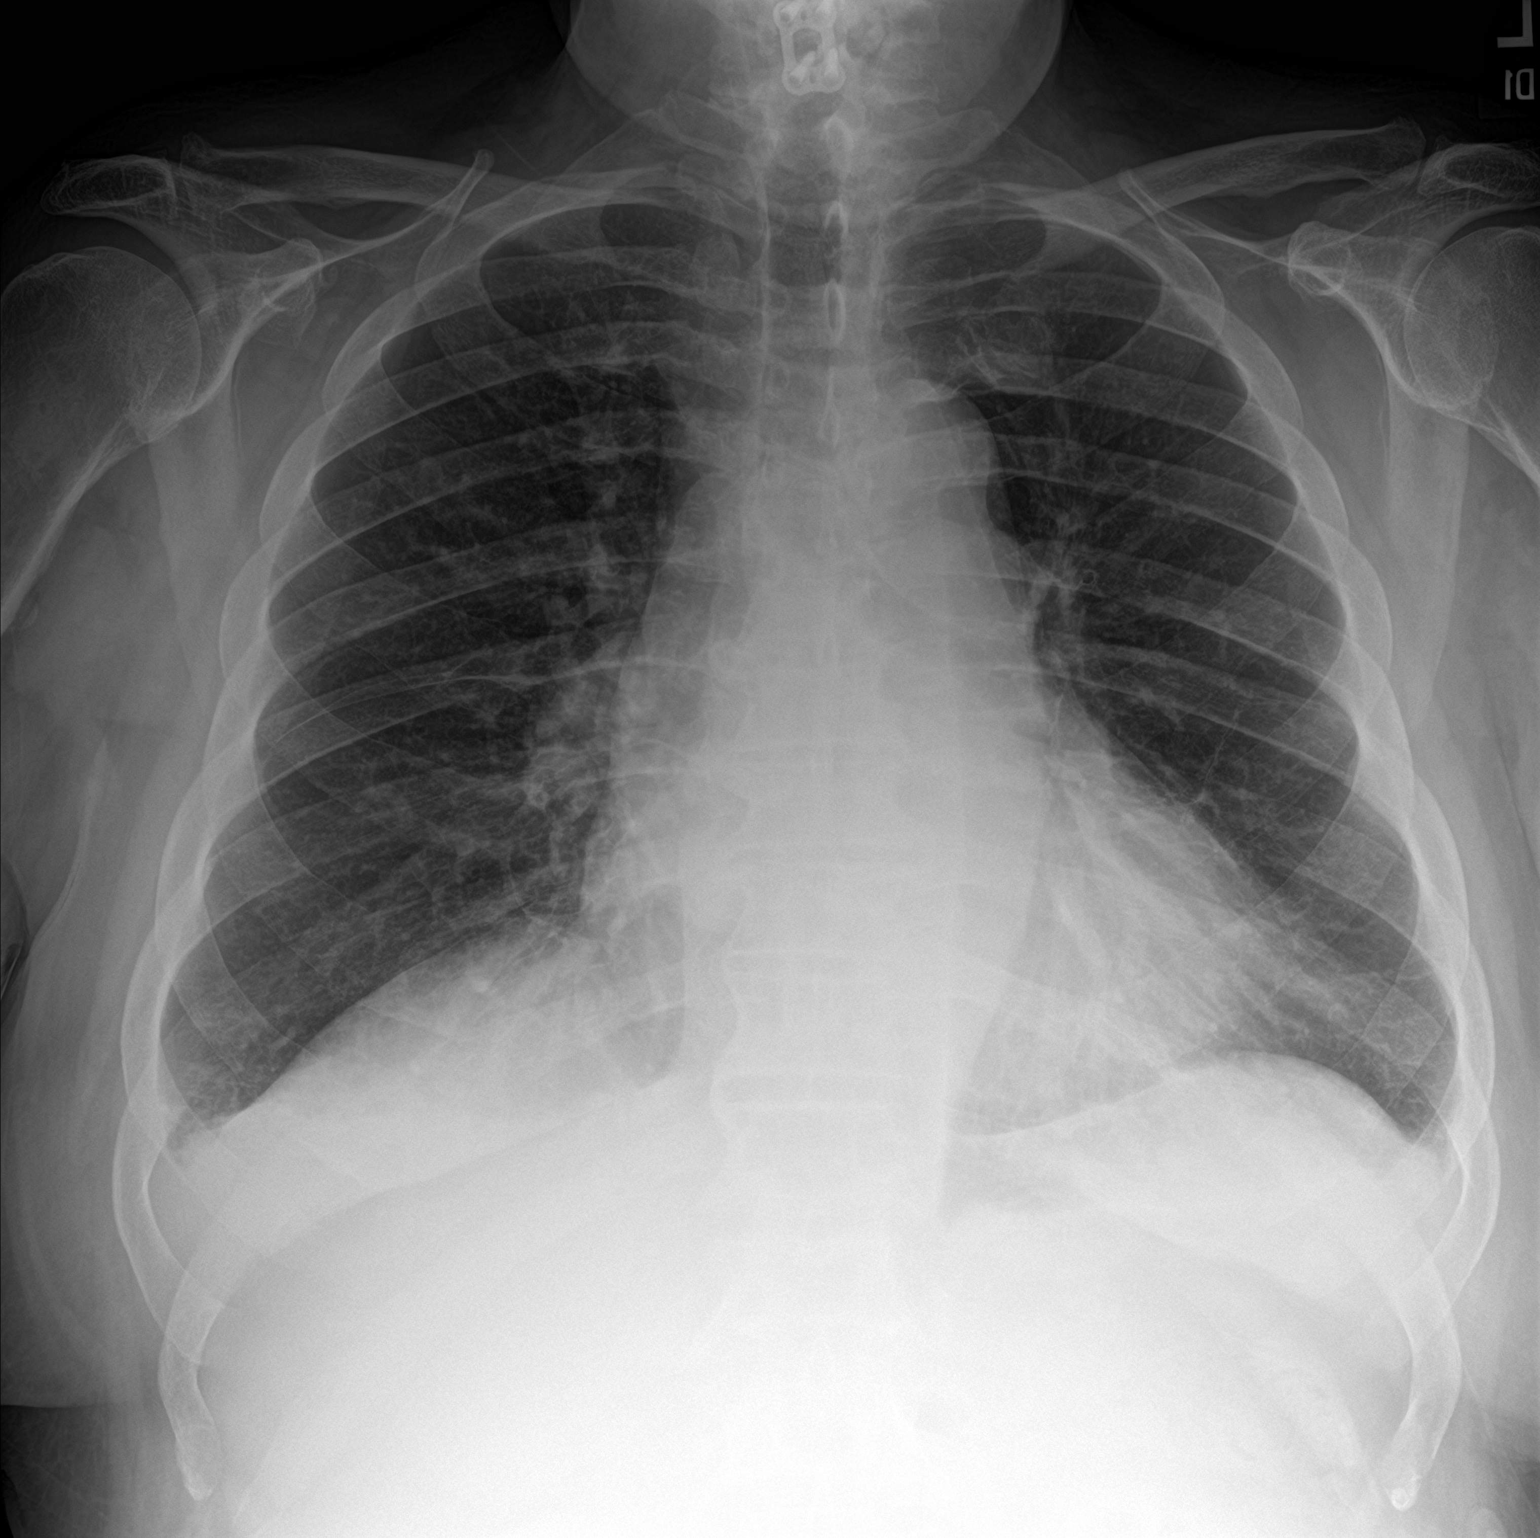

[chest lat (2 of 2)]
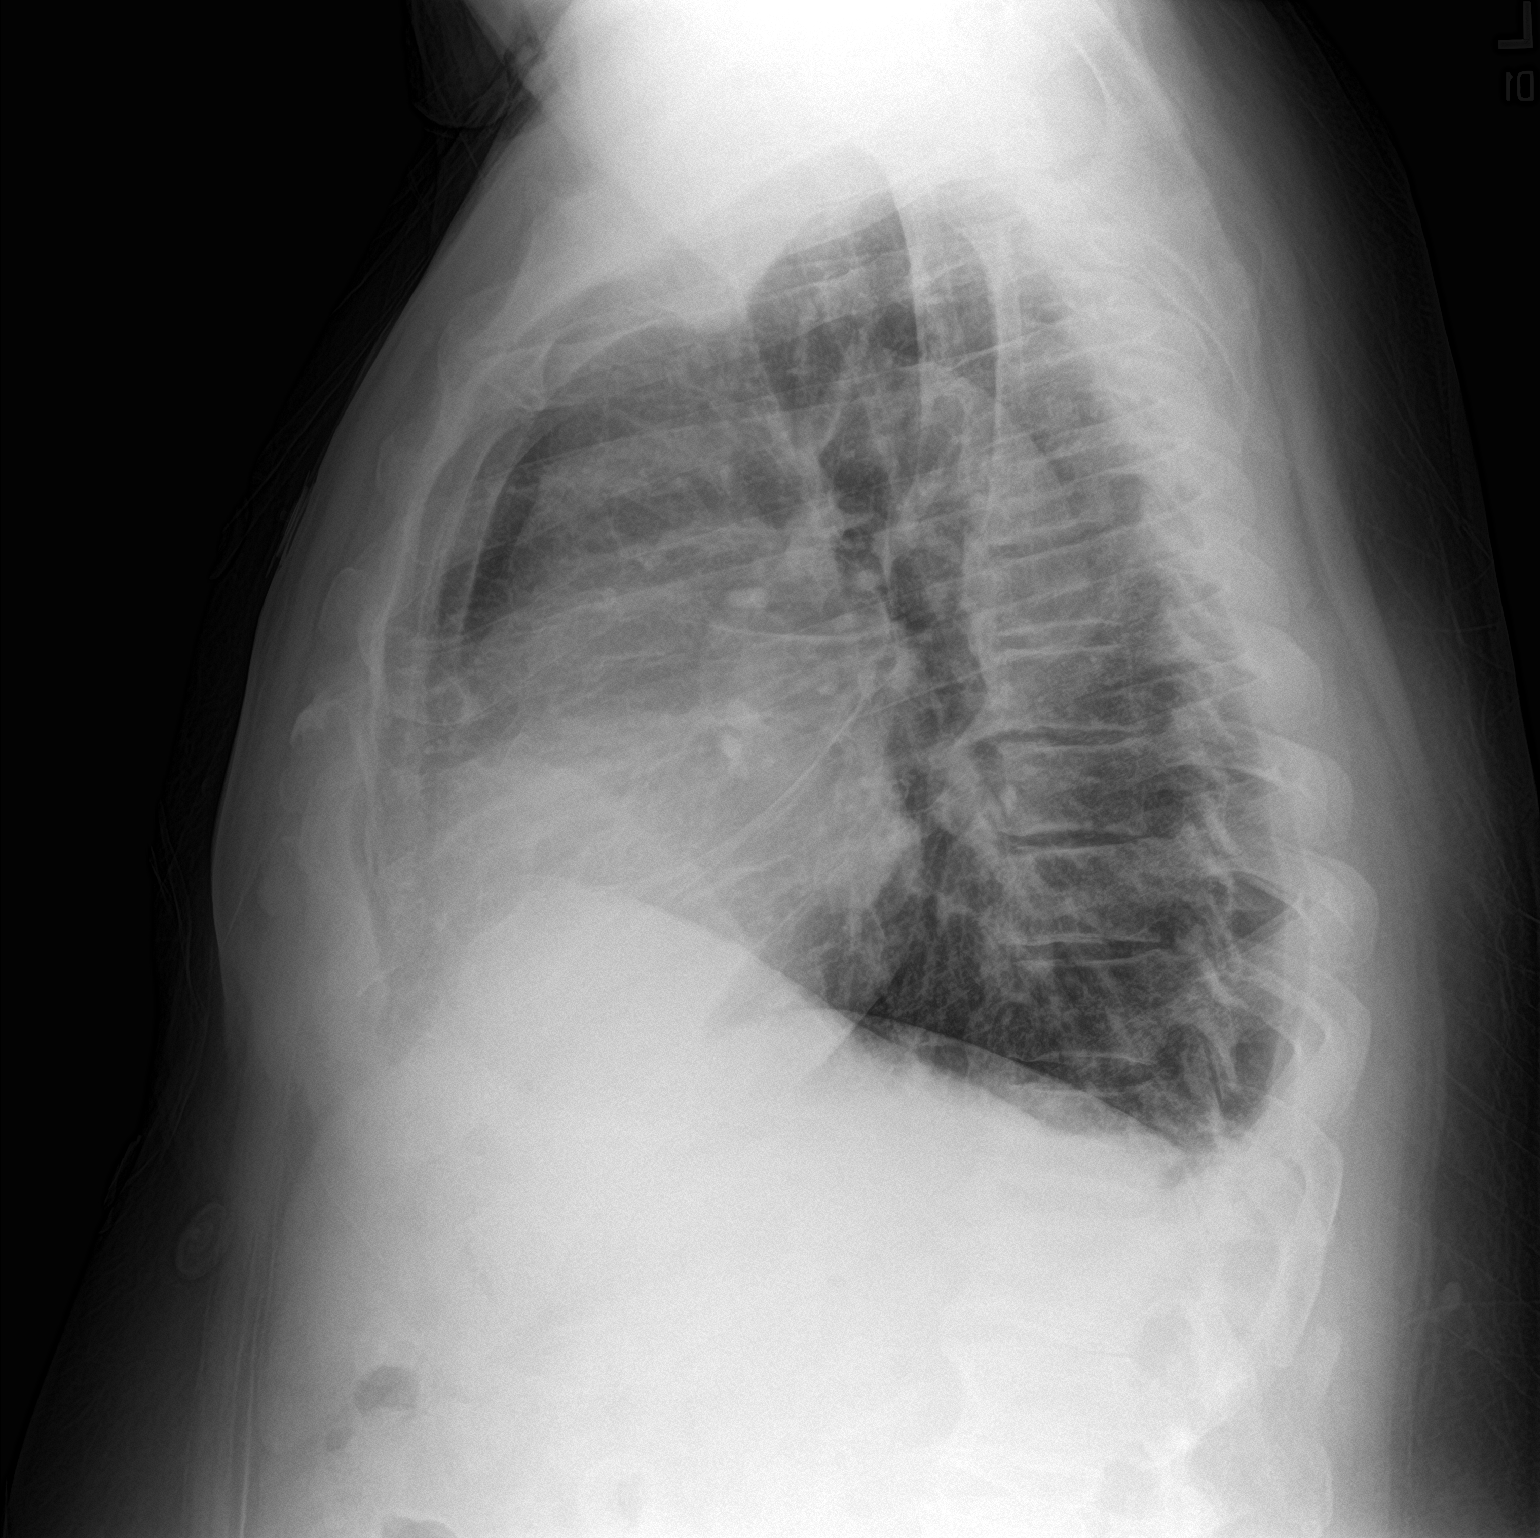

[2 of 2 positions shown; findings below may reference images not displayed]

FINDINGS: Mild bilateral interstitial thickening. Trace right pleural
effusion. No focal consolidation. No pleural effusion or
pneumothorax. Stable cardiomegaly.

No acute osseous abnormality.
IMPRESSION: 1. Cardiomegaly with mild pulmonary vascular congestion.

## 2023-06-02 ENCOUNTER — Other Ambulatory Visit: Payer: Self-pay | Admitting: Internal Medicine

## 2023-08-17 ENCOUNTER — Other Ambulatory Visit (HOSPITAL_BASED_OUTPATIENT_CLINIC_OR_DEPARTMENT_OTHER): Payer: Self-pay

## 2023-08-17 ENCOUNTER — Ambulatory Visit (INDEPENDENT_AMBULATORY_CARE_PROVIDER_SITE_OTHER): Admitting: Family Medicine

## 2023-08-17 ENCOUNTER — Encounter (HOSPITAL_BASED_OUTPATIENT_CLINIC_OR_DEPARTMENT_OTHER): Payer: Self-pay | Admitting: Family Medicine

## 2023-08-17 VITALS — BP 153/99 | HR 75 | Temp 97.9°F | Resp 16 | Ht 67.72 in | Wt 243.2 lb

## 2023-08-17 DIAGNOSIS — F39 Unspecified mood [affective] disorder: Secondary | ICD-10-CM | POA: Insufficient documentation

## 2023-08-17 DIAGNOSIS — Z1211 Encounter for screening for malignant neoplasm of colon: Secondary | ICD-10-CM | POA: Insufficient documentation

## 2023-08-17 DIAGNOSIS — N1832 Chronic kidney disease, stage 3b: Secondary | ICD-10-CM

## 2023-08-17 DIAGNOSIS — D509 Iron deficiency anemia, unspecified: Secondary | ICD-10-CM | POA: Insufficient documentation

## 2023-08-17 DIAGNOSIS — E785 Hyperlipidemia, unspecified: Secondary | ICD-10-CM

## 2023-08-17 DIAGNOSIS — I10 Essential (primary) hypertension: Secondary | ICD-10-CM

## 2023-08-17 DIAGNOSIS — K219 Gastro-esophageal reflux disease without esophagitis: Secondary | ICD-10-CM | POA: Insufficient documentation

## 2023-08-17 DIAGNOSIS — R159 Full incontinence of feces: Secondary | ICD-10-CM | POA: Insufficient documentation

## 2023-08-17 DIAGNOSIS — Z860100 Personal history of colon polyps, unspecified: Secondary | ICD-10-CM | POA: Insufficient documentation

## 2023-08-17 DIAGNOSIS — E7439 Other disorders of intestinal carbohydrate absorption: Secondary | ICD-10-CM

## 2023-08-17 DIAGNOSIS — K5900 Constipation, unspecified: Secondary | ICD-10-CM | POA: Insufficient documentation

## 2023-08-17 DIAGNOSIS — I5020 Unspecified systolic (congestive) heart failure: Secondary | ICD-10-CM | POA: Insufficient documentation

## 2023-08-17 DIAGNOSIS — K5730 Diverticulosis of large intestine without perforation or abscess without bleeding: Secondary | ICD-10-CM | POA: Insufficient documentation

## 2023-08-17 DIAGNOSIS — R5383 Other fatigue: Secondary | ICD-10-CM

## 2023-08-17 MED ORDER — METOPROLOL SUCCINATE ER 25 MG PO TB24
25.0000 mg | ORAL_TABLET | Freq: Every day | ORAL | 1 refills | Status: DC
  Filled 2023-08-17 (×2): qty 90, 90d supply, fill #0

## 2023-08-17 NOTE — Progress Notes (Signed)
 Established Patient Office Visit  Subjective   Patient ID: Karl Peters, male    DOB: [birth year]  Age: 83 y.o. MRN: [id number]  Chief Complaint  Patient presents with   Establish Care    Establish care    F/u as above.  New to my practice.  Generally doing well with few concerns.  Extended discussion about his history today.    Past Medical History:  Diagnosis Date   Acute on chronic diastolic CHF (congestive heart failure), NYHA class 3 (HCC) 04/10/2021   f/by [facility]   CKD (chronic kidney disease) stage 3, GFR 30-59 ml/min (HCC)    Diabetes mellitus    Dyslipidemia    GERD (gastroesophageal reflux disease)    Hypertension    Obesity    Osteoarthritis    PVCs (premature ventricular contractions)     Outpatient Encounter Medications as of 08/17/2023  Medication Sig   aspirin  EC 81 MG tablet Take 1 tablet (81 mg total) by mouth daily. Swallow whole.   buPROPion  (WELLBUTRIN  XL) 300 MG 24 hr tablet Take 300 mg by mouth every morning.   furosemide  (LASIX ) 40 MG tablet TAKE 1 TABLET BY MOUTH TWICE A DAY   glimepiride (AMARYL) 4 MG tablet Take 4 mg by mouth every morning. (Patient not taking: Reported on 08/17/2023)   metFORMIN (GLUCOPHAGE) 500 MG tablet Take 1,000 mg by mouth every morning. (Patient not taking: Reported on 11/19/2022)   metoprolol  succinate (TOPROL -XL) 25 MG 24 hr tablet Take 25 mg by mouth daily.   metoprolol  succinate (TOPROL -XL) 25 MG 24 hr tablet Take 1 tablet (25 mg total) by mouth daily. TAKE WITH OR IMMEDIATELY FOLLOWING A MEAL.   montelukast  (SINGULAIR ) 10 MG tablet Take 10 mg by mouth every morning. (Patient not taking: Reported on 11/19/2022)   pantoprazole  (PROTONIX ) 40 MG tablet Take 40 mg by mouth every morning. (Patient not taking: Reported on 11/19/2022)   rosuvastatin  (CRESTOR ) 40 MG tablet Take 40 mg by mouth daily. (Patient not taking: Reported on 11/19/2022)   [DISCONTINUED] metoprolol  succinate (TOPROL -XL) 25 MG 24 hr tablet Take 1 tablet (25  mg total) by mouth daily. TAKE WITH OR IMMEDIATELY FOLLOWING A MEAL.   No facility-administered encounter medications on file as of 08/17/2023.    Social History   Tobacco Use   Smoking status: Never   Smokeless tobacco: Never  Substance Use Topics   Alcohol use: No   Drug use: Never      Review of Systems  Constitutional:  Negative for diaphoresis, fever, malaise/fatigue and weight loss.  Respiratory:  Negative for cough, shortness of breath and wheezing.   Cardiovascular:  Negative for chest pain, palpitations, orthopnea, claudication, leg swelling and PND.      Objective:     BP (!) 153/99 (BP Location: Right Arm, Patient Position: Standing, Cuff Size: Large)   Pulse 75   Temp 97.9 F (36.6 C) (Oral)   Resp 16   Ht 5' 7.72 (1.72 m)   Wt 243 lb 3.2 oz (110.3 kg)   SpO2 97%   BMI 37.29 kg/m    Physical Exam Constitutional:      General: He is not in acute distress.    Appearance: Normal appearance. He is obese.  HENT:     Head: Normocephalic.  Neck:     Vascular: No carotid bruit.   Cardiovascular:     Rate and Rhythm: Normal rate and regular rhythm.     Pulses: Normal pulses.     Heart  sounds: Normal heart sounds.  Pulmonary:     Effort: Pulmonary effort is normal.     Breath sounds: Normal breath sounds.  Abdominal:     General: Bowel sounds are normal.     Palpations: Abdomen is soft.   Musculoskeletal:     Cervical back: Neck supple. No tenderness.     Right lower leg: No edema.     Left lower leg: No edema.   Neurological:     Mental Status: He is alert.      No results found for any visits on 08/17/23.    The ASCVD Risk score (Arnett DK, et al., 2019) failed to calculate for the following reasons:   The 2019 ASCVD risk score is only valid for ages 29 to 84    Assessment & Plan:  Chronic kidney disease (CKD) stage G3b/A1, moderately decreased glomerular filtration rate (GFR) between 30-44 mL/min/1.73 square meter and albuminuria  creatinine ratio less than 30 mg/g (HCC) Assessment & Plan: Stable.  Continue reviewing old records.  Await labs and f/u with him soon.  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 TAKE WITH OR IMMEDIATELY FOLLOWING A MEAL.  Dispense: 90 tablet; Refill: 1  Primary hypertension  Glucose intolerance -     Hemoglobin A1c  Dyslipidemia -     Lipid panel  Fatigue, unspecified type -     Vitamin B12    Return in about 4 weeks (around 09/14/2023) for chronic follow-up.    REDDING PONCE NORLEEN FALCON., MD

## 2023-08-17 NOTE — Assessment & Plan Note (Addendum)
 Stable.  Continue reviewing old records.  Await labs and f/u with him soon.  Work on some weight loss.  Fall precautions.

## 2023-08-18 ENCOUNTER — Ambulatory Visit (HOSPITAL_BASED_OUTPATIENT_CLINIC_OR_DEPARTMENT_OTHER): Payer: Self-pay | Admitting: Family Medicine

## 2023-08-18 LAB — COMPREHENSIVE METABOLIC PANEL WITH GFR
ALT: 6 IU/L (ref 0–44)
AST: 11 IU/L (ref 0–40)
Albumin: 4 g/dL (ref 3.7–4.7)
Alkaline Phosphatase: 112 IU/L (ref 44–121)
BUN/Creatinine Ratio: 15 (ref 10–24)
BUN: 26 mg/dL (ref 8–27)
Bilirubin Total: 0.5 mg/dL (ref 0.0–1.2)
CO2: 22 mmol/L (ref 20–29)
Calcium: 9.1 mg/dL (ref 8.6–10.2)
Chloride: 105 mmol/L (ref 96–106)
Creatinine, Ser: 1.73 mg/dL — ABNORMAL HIGH (ref 0.76–1.27)
Globulin, Total: 2.7 g/dL (ref 1.5–4.5)
Glucose: 94 mg/dL (ref 70–99)
Potassium: 5.3 mmol/L — ABNORMAL HIGH (ref 3.5–5.2)
Sodium: 143 mmol/L (ref 134–144)
Total Protein: 6.7 g/dL (ref 6.0–8.5)
eGFR: 39 mL/min/{1.73_m2} — ABNORMAL LOW (ref >59)

## 2023-08-18 LAB — CBC WITH DIFFERENTIAL/PLATELET
Basophils Absolute: 0.1 10*3/uL (ref 0.0–0.2)
Basos: 1 %
EOS (ABSOLUTE): 0.2 10*3/uL (ref 0.0–0.4)
Eos: 2 %
Hematocrit: 37.6 % (ref 37.5–51.0)
Hemoglobin: 12 g/dL — ABNORMAL LOW (ref 13.0–17.7)
Immature Grans (Abs): 0.1 10*3/uL (ref 0.0–0.1)
Immature Granulocytes: 1 %
Lymphocytes Absolute: 2.1 10*3/uL (ref 0.7–3.1)
Lymphs: 23 %
MCH: 29.8 pg (ref 26.6–33.0)
MCHC: 31.9 g/dL (ref 31.5–35.7)
MCV: 93 fL (ref 79–97)
Monocytes Absolute: 0.9 10*3/uL (ref 0.1–0.9)
Monocytes: 9 %
Neutrophils Absolute: 5.9 10*3/uL (ref 1.4–7.0)
Neutrophils: 64 %
Platelets: 265 10*3/uL (ref 150–450)
RBC: 4.03 x10E6/uL — ABNORMAL LOW (ref 4.14–5.80)
RDW: 13.3 % (ref 11.6–15.4)
WBC: 9.1 10*3/uL (ref 3.4–10.8)

## 2023-08-18 LAB — VITAMIN B12: Vitamin B-12: 189 pg/mL — ABNORMAL LOW (ref 232–1245)

## 2023-08-18 LAB — LIPID PANEL
Chol/HDL Ratio: 4.7 ratio (ref 0.0–5.0)
Cholesterol, Total: 166 mg/dL (ref 100–199)
HDL: 35 mg/dL — ABNORMAL LOW (ref >39)
LDL Chol Calc (NIH): 116 mg/dL — ABNORMAL HIGH (ref 0–99)
Triglycerides: 80 mg/dL (ref 0–149)
VLDL Cholesterol Cal: 15 mg/dL (ref 5–40)

## 2023-08-18 LAB — HEMOGLOBIN A1C
Est. average glucose Bld gHb Est-mCnc: 126 mg/dL
Hgb A1c MFr Bld: 6 % — ABNORMAL HIGH (ref 4.8–5.6)

## 2023-08-24 ENCOUNTER — Other Ambulatory Visit (HOSPITAL_BASED_OUTPATIENT_CLINIC_OR_DEPARTMENT_OTHER): Payer: Self-pay | Admitting: Family Medicine

## 2023-08-24 ENCOUNTER — Ambulatory Visit (INDEPENDENT_AMBULATORY_CARE_PROVIDER_SITE_OTHER)

## 2023-08-24 ENCOUNTER — Telehealth (HOSPITAL_BASED_OUTPATIENT_CLINIC_OR_DEPARTMENT_OTHER): Payer: Self-pay

## 2023-08-24 DIAGNOSIS — E875 Hyperkalemia: Secondary | ICD-10-CM

## 2023-08-24 DIAGNOSIS — D510 Vitamin B12 deficiency anemia due to intrinsic factor deficiency: Secondary | ICD-10-CM

## 2023-08-24 DIAGNOSIS — R5383 Other fatigue: Secondary | ICD-10-CM

## 2023-08-24 MED ORDER — CYANOCOBALAMIN 1000 MCG/ML IJ SOLN
1000.0000 ug | INTRAMUSCULAR | 3 refills | Status: DC
Start: 2023-08-24 — End: 2023-08-26

## 2023-08-24 MED ORDER — CYANOCOBALAMIN 1000 MCG/ML IJ SOLN
1000.0000 ug | Freq: Once | INTRAMUSCULAR | Status: AC
Start: 2023-08-24 — End: 2023-08-24
  Administered 2023-08-24: 1000 ug via INTRAMUSCULAR

## 2023-08-24 NOTE — Telephone Encounter (Signed)
 Saw pt today for b-12 and labs. Pt said he would rather do b-12 shots at home. His daughter is a CMA and he can also do them. He is asking for rx to be sent to [facility].

## 2023-08-24 NOTE — Progress Notes (Signed)
 Patient is in office today for a nurse visit for B12 Injection. Patient Injection was given in the  Left deltoid. Patient tolerated injection well.

## 2023-08-25 ENCOUNTER — Ambulatory Visit (HOSPITAL_BASED_OUTPATIENT_CLINIC_OR_DEPARTMENT_OTHER): Payer: Self-pay | Admitting: Family Medicine

## 2023-08-25 DIAGNOSIS — E1159 Type 2 diabetes mellitus with other circulatory complications: Secondary | ICD-10-CM

## 2023-08-25 LAB — BASIC METABOLIC PANEL WITH GFR
BUN/Creatinine Ratio: 16 (ref 10–24)
BUN: 24 mg/dL (ref 8–27)
CO2: 23 mmol/L (ref 20–29)
Calcium: 8.7 mg/dL (ref 8.6–10.2)
Chloride: 106 mmol/L (ref 96–106)
Creatinine, Ser: 1.52 mg/dL — ABNORMAL HIGH (ref 0.76–1.27)
Glucose: 104 mg/dL — ABNORMAL HIGH (ref 70–99)
Potassium: 4.3 mmol/L (ref 3.5–5.2)
Sodium: 141 mmol/L (ref 134–144)
eGFR: 46 mL/min/1.73 — ABNORMAL LOW (ref >59)

## 2023-08-26 ENCOUNTER — Other Ambulatory Visit (HOSPITAL_BASED_OUTPATIENT_CLINIC_OR_DEPARTMENT_OTHER): Payer: Self-pay | Admitting: Family Medicine

## 2023-08-26 ENCOUNTER — Other Ambulatory Visit (HOSPITAL_BASED_OUTPATIENT_CLINIC_OR_DEPARTMENT_OTHER): Payer: Self-pay

## 2023-08-26 DIAGNOSIS — D510 Vitamin B12 deficiency anemia due to intrinsic factor deficiency: Secondary | ICD-10-CM

## 2023-08-26 MED ORDER — CYANOCOBALAMIN 1000 MCG/ML IJ SOLN
INTRAMUSCULAR | 1 refills | Status: DC
  Filled 2023-08-26: qty 1, 30d supply, fill #0
  Filled 2023-08-26: qty 4, 28d supply, fill #0
  Filled 2023-10-20: qty 3, 84d supply, fill #1
  Filled 2024-02-01: qty 1, 30d supply, fill #2

## 2023-08-26 MED ORDER — OZEMPIC (0.25 OR 0.5 MG/DOSE) 2 MG/3ML ~~LOC~~ SOPN
0.5000 mg | PEN_INJECTOR | SUBCUTANEOUS | 0 refills | Status: DC
Start: 2023-08-26 — End: 2023-09-14
  Filled 2023-08-26: qty 3, 28d supply, fill #0

## 2023-08-31 ENCOUNTER — Encounter (HOSPITAL_BASED_OUTPATIENT_CLINIC_OR_DEPARTMENT_OTHER): Payer: Self-pay

## 2023-09-14 ENCOUNTER — Encounter (HOSPITAL_BASED_OUTPATIENT_CLINIC_OR_DEPARTMENT_OTHER): Payer: Self-pay | Admitting: Family Medicine

## 2023-09-14 ENCOUNTER — Ambulatory Visit (INDEPENDENT_AMBULATORY_CARE_PROVIDER_SITE_OTHER): Admitting: Family Medicine

## 2023-09-14 VITALS — BP 147/75 | HR 67 | Temp 98.8°F | Resp 16 | Ht 68.0 in | Wt 239.2 lb

## 2023-09-14 DIAGNOSIS — I10 Essential (primary) hypertension: Secondary | ICD-10-CM | POA: Diagnosis not present

## 2023-09-14 DIAGNOSIS — N1832 Chronic kidney disease, stage 3b: Secondary | ICD-10-CM

## 2023-09-14 DIAGNOSIS — E538 Deficiency of other specified B group vitamins: Secondary | ICD-10-CM | POA: Diagnosis not present

## 2023-09-14 NOTE — Assessment & Plan Note (Signed)
 Improved.  I will take another look at whether he needs to consider resuming a statin.

## 2023-09-14 NOTE — Progress Notes (Unsigned)
 Established Patient Office Visit  Subjective   Patient ID: Karl Peters, male    DOB: [birth year]  Age: 83 y.o. MRN: [id number]  Chief Complaint  Patient presents with   Follow-up    Follow-up visit    F/u as above.  Please see recent notes for details.  He has started his B12 shots and does feel its helped his energy level some.  He continues to struggle with weight loss, but generally feels well.  Minor left foot pain noted, but no foot lesions.  He reminds me that he no longer needs diabetic medications.    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 Tobie   Dyslipidemia    GERD (gastroesophageal reflux disease)    Glucose intolerance    Hypertension    Obesity    Osteoarthritis    Pernicious anemia    PVCs (premature ventricular contractions)     Outpatient Encounter Medications as of 09/14/2023  Medication Sig   aspirin  EC 81 MG tablet Take 1 tablet (81 mg total) by mouth daily. Swallow whole.   buPROPion  (WELLBUTRIN  XL) 300 MG 24 hr tablet Take 300 mg by mouth every morning.   cyanocobalamin  (VITAMIN B12) 1000 MCG/ML injection Inject 1 ml (1,000 mcg) into the muscle every week for 4 weeks, then Inject 1 mL (1,000 mcg total) into the muscle every 30 (thirty) days   furosemide  (LASIX ) 40 MG tablet TAKE 1 TABLET BY MOUTH TWICE A DAY   metoprolol  succinate (TOPROL -XL) 25 MG 24 hr tablet Take 25 mg by mouth daily.   metoprolol  succinate (TOPROL -XL) 25 MG 24 hr tablet Take 1 tablet (25 mg total) by mouth daily. TAKE WITH OR IMMEDIATELY FOLLOWING A MEAL.   [DISCONTINUED] Semaglutide ,0.25 or 0.5MG /DOS, (OZEMPIC , 0.25 OR 0.5 MG/DOSE,) 2 MG/3ML SOPN Inject 0.5 mg into the skin once a week.   glimepiride (AMARYL) 4 MG tablet Take 4 mg by mouth every  morning. (Patient not taking: Reported on 09/14/2023)   metFORMIN (GLUCOPHAGE) 500 MG tablet Take 1,000 mg by mouth every morning. (Patient not taking: Reported on 11/19/2022)   montelukast  (SINGULAIR ) 10 MG tablet Take 10 mg by mouth every morning. (Patient not taking: Reported on 11/19/2022)   pantoprazole  (PROTONIX ) 40 MG tablet Take 40 mg by mouth every morning. (Patient not taking: Reported on 11/19/2022)   rosuvastatin  (CRESTOR ) 40 MG tablet Take 40 mg by mouth daily. (Patient not taking: Reported on 11/19/2022)   No facility-administered encounter medications on file as of 09/14/2023.    Social History   Tobacco Use   Smoking status: Never   Smokeless tobacco: Never  Substance Use Topics   Alcohol use: No   Drug use: Never       Review of Systems  Constitutional:  Negative for diaphoresis, fever, malaise/fatigue and weight loss.  Respiratory:  Negative for cough, shortness of breath and wheezing.   Cardiovascular:  Negative for chest pain, palpitations, orthopnea, claudication, leg swelling and PND.      Objective:     BP (!) 147/75 (BP Location: Right Arm, Patient Position: Standing, Cuff Size: Large)   Pulse 67   Temp 98.8 F (37.1 C) (Oral)   Resp 16   Ht 5' 8 (1.727 m)  Wt 239 lb 3.2 oz (108.5 kg)   SpO2 96%   BMI 36.37 kg/m    Physical Exam Constitutional:      General: He is not in acute distress.    Appearance: Normal appearance. He is obese.  HENT:     Head: Normocephalic.  Neck:     Vascular: No carotid bruit.  Cardiovascular:     Rate and Rhythm: Normal rate and regular rhythm.     Pulses: Normal pulses.     Heart sounds: Normal heart sounds.  Pulmonary:     Effort: Pulmonary effort is normal.     Breath sounds: Normal breath sounds.  Abdominal:     General: Bowel sounds are normal.     Palpations: Abdomen is soft.  Musculoskeletal:     Cervical back: Neck supple. No tenderness.     Right lower leg: No edema.     Left lower leg: No edema.      Comments: Left foot with only minor tenderness.  Lots of dry skin and Onychomycosis.  He declines to see a foot doctor for nail trimming.  Neurological:     Mental Status: He is alert.      No results found for any visits on 09/14/23.    The ASCVD Risk score (Arnett DK, et al., 2019) failed to calculate for the following reasons:   The 2019 ASCVD risk score is only valid for ages 23 to 37    Assessment & Plan:  Primary hypertension Assessment & Plan: Improved.  I advised him to resume a statin and he agrees to consider it.  I suggest he schedule a CPE in 3 mos.   Vitamin B12 deficiency  Chronic kidney disease (CKD) stage G3b/A1, moderately decreased glomerular filtration rate (GFR) between 30-44 mL/min/1.73 square meter and albuminuria creatinine ratio less than 30 mg/g (HCC)  I personally spent a total of 35 minutes in the care of the patient today including performing a medically appropriate exam/evaluation, counseling and educating, and communicating results.   Return in about 3 months (around 12/15/2023) for chronic follow-up.    REDDING PONCE NORLEEN FALCON., MD

## 2023-09-14 NOTE — Assessment & Plan Note (Signed)
 Recent labs reviewed.  Diabetes a touch over treated.  Reduce his Glimepiride to 2mg  daily for now.  Continue remaining active in the cooler morning hours.

## 2023-09-15 ENCOUNTER — Encounter (HOSPITAL_BASED_OUTPATIENT_CLINIC_OR_DEPARTMENT_OTHER): Payer: Self-pay | Admitting: Family Medicine

## 2023-10-20 ENCOUNTER — Other Ambulatory Visit (HOSPITAL_BASED_OUTPATIENT_CLINIC_OR_DEPARTMENT_OTHER): Payer: Self-pay

## 2023-11-08 ENCOUNTER — Other Ambulatory Visit (HOSPITAL_COMMUNITY): Payer: Self-pay | Admitting: Adult Health

## 2023-11-16 ENCOUNTER — Other Ambulatory Visit (HOSPITAL_BASED_OUTPATIENT_CLINIC_OR_DEPARTMENT_OTHER): Payer: Self-pay

## 2023-11-16 MED ORDER — TORSEMIDE 20 MG PO TABS
40.0000 mg | ORAL_TABLET | Freq: Every day | ORAL | 12 refills | Status: AC
  Filled 2023-11-16: qty 60, 30d supply, fill #0

## 2023-11-25 ENCOUNTER — Other Ambulatory Visit (HOSPITAL_BASED_OUTPATIENT_CLINIC_OR_DEPARTMENT_OTHER): Payer: Self-pay

## 2023-11-29 ENCOUNTER — Other Ambulatory Visit (HOSPITAL_BASED_OUTPATIENT_CLINIC_OR_DEPARTMENT_OTHER): Payer: Self-pay

## 2023-12-07 ENCOUNTER — Other Ambulatory Visit (HOSPITAL_BASED_OUTPATIENT_CLINIC_OR_DEPARTMENT_OTHER): Payer: Self-pay

## 2023-12-07 ENCOUNTER — Other Ambulatory Visit (HOSPITAL_BASED_OUTPATIENT_CLINIC_OR_DEPARTMENT_OTHER): Payer: Self-pay | Admitting: Family Medicine

## 2023-12-07 NOTE — Telephone Encounter (Unsigned)
 Copied from CRM #[id number]. Topic: Clinical - Medication Refill >> Dec 07, 2023  9:33 AM Amy B wrote: Medication: buPROPion  (WELLBUTRIN  XL) 300 MG 24 hr tablet  Has the patient contacted their pharmacy? Yes (Agent: If no, request that the patient contact the pharmacy for the refill. If patient does not wish to contact the pharmacy document the reason why and proceed with request.) (Agent: If yes, when and what did the pharmacy advise?)  This is the patient's preferred pharmacy:  [facility] [address], [address] [place] [address] Phone: [phone] Fax: [phone]  Is this the correct pharmacy for this prescription? Yes If no, delete pharmacy and type the correct one.   Has the prescription been filled recently? No  Is the patient out of the medication? Yes  Has the patient been seen for an appointment in the last year OR does the patient have an upcoming appointment? Yes  Can we respond through MyChart? No patient prefers phone call  Agent: Please be advised that Rx refills may take up to 3 business days. We ask that you follow-up with your pharmacy.

## 2023-12-08 ENCOUNTER — Other Ambulatory Visit (HOSPITAL_BASED_OUTPATIENT_CLINIC_OR_DEPARTMENT_OTHER): Payer: Self-pay

## 2023-12-08 ENCOUNTER — Ambulatory Visit (HOSPITAL_BASED_OUTPATIENT_CLINIC_OR_DEPARTMENT_OTHER)

## 2023-12-08 MED ORDER — BUPROPION HCL ER (XL) 300 MG PO TB24
300.0000 mg | ORAL_TABLET | Freq: Every morning | ORAL | 0 refills | Status: DC
  Filled 2023-12-08: qty 2, 2d supply, fill #0
  Filled 2023-12-08: qty 28, 28d supply, fill #0

## 2023-12-16 ENCOUNTER — Other Ambulatory Visit (HOSPITAL_BASED_OUTPATIENT_CLINIC_OR_DEPARTMENT_OTHER): Payer: Self-pay

## 2023-12-16 ENCOUNTER — Ambulatory Visit (HOSPITAL_BASED_OUTPATIENT_CLINIC_OR_DEPARTMENT_OTHER): Admitting: Family Medicine

## 2023-12-16 ENCOUNTER — Encounter (HOSPITAL_BASED_OUTPATIENT_CLINIC_OR_DEPARTMENT_OTHER): Payer: Self-pay | Admitting: Family Medicine

## 2023-12-16 VITALS — BP 181/96 | HR 72 | Temp 97.9°F | Resp 18 | Ht 68.0 in | Wt 251.0 lb

## 2023-12-16 DIAGNOSIS — N1831 Chronic kidney disease, stage 3a: Secondary | ICD-10-CM

## 2023-12-16 DIAGNOSIS — E7439 Other disorders of intestinal carbohydrate absorption: Secondary | ICD-10-CM | POA: Diagnosis not present

## 2023-12-16 DIAGNOSIS — I10 Essential (primary) hypertension: Secondary | ICD-10-CM

## 2023-12-16 DIAGNOSIS — I5020 Unspecified systolic (congestive) heart failure: Secondary | ICD-10-CM | POA: Diagnosis not present

## 2023-12-16 MED ORDER — AMLODIPINE BESYLATE 5 MG PO TABS
5.0000 mg | ORAL_TABLET | Freq: Every day | ORAL | 1 refills | Status: AC
Start: 2023-12-16 — End: ?
  Filled 2023-12-16: qty 30, 30d supply, fill #0

## 2023-12-16 NOTE — Assessment & Plan Note (Signed)
 Uncontrolled.  Obviously continue his beta blocker.  Add Amlodipine for now, until Cardiology can adjust his meds per their preference.

## 2023-12-16 NOTE — Assessment & Plan Note (Signed)
 No clear decompensation in the office today, but clearly overdue to see Cardiology and likely needs a repeat Echo, etc.  Will alert his Cardiology team to our concerns and request they see him pretty soon.  DASH diet.

## 2023-12-16 NOTE — Progress Notes (Unsigned)
 Established Patient Office Visit  Subjective   Patient ID: Karl Peters, male    DOB: 02-16-1941  Age: 83 y.o. MRN: [id number]  Chief Complaint  Patient presents with   Medical Management of Chronic Issues    F/u as above.  He states he has become less able to walk.  Possibly due to his CHF and admits he hasn't seen Cardiology in quite a while.  Ongoing struggles with weight loss.  He clearly struggles with DOE and fatigue on exertion, but does well at rest.  Denies any CP or tightness.  Minor coughing only.  Arthritis pain is pretty mild and manageable.  Tries to minimize his salt intake.    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 Tobie   Dyslipidemia    GERD (gastroesophageal reflux disease)    Glucose intolerance    Hypertension    Obesity    Osteoarthritis    Pernicious anemia    PVCs (premature ventricular contractions)     Outpatient Encounter Medications as of 12/16/2023  Medication Sig   amLODipine (NORVASC) 5 MG tablet Take 1 tablet (5 mg total) by mouth daily.   aspirin  EC 81 MG tablet Take 1 tablet (81 mg total) by mouth daily. Swallow whole.   buPROPion  (WELLBUTRIN  XL) 300 MG 24 hr tablet Take 1 tablet (300 mg total) by mouth every morning.   Cholecalciferol (VITAMIN D-3 PO) Take by mouth.   cyanocobalamin  (VITAMIN B12) 1000 MCG/ML injection Inject 1 ml (1,000 mcg) into the muscle every week for 4 weeks, then Inject 1 mL (1,000 mcg total) into the muscle every 30 (thirty) days   metoprolol  succinate (TOPROL -XL) 25 MG 24 hr tablet Take 25 mg by mouth daily.   torsemide (DEMADEX) 20 MG tablet Take 2 tablets (40 mg total) by mouth daily. *stop furosemide *   furosemide  (LASIX ) 40 MG tablet TAKE 1 TABLET BY MOUTH TWICE A DAY    glimepiride (AMARYL) 4 MG tablet Take 4 mg by mouth every morning. (Patient not taking: Reported on 09/14/2023)   metFORMIN (GLUCOPHAGE) 500 MG tablet Take 1,000 mg by mouth every morning. (Patient not taking: Reported on 11/19/2022)   metoprolol  succinate (TOPROL -XL) 25 MG 24 hr tablet Take 1 tablet (25 mg total) by mouth daily. TAKE WITH OR IMMEDIATELY FOLLOWING A MEAL.   montelukast  (SINGULAIR ) 10 MG tablet Take 10 mg by mouth every morning. (Patient not taking: Reported on 11/19/2022)   pantoprazole  (PROTONIX ) 40 MG tablet Take 40 mg by mouth every morning. (Patient not taking: Reported on 11/19/2022)   rosuvastatin  (CRESTOR ) 40 MG tablet Take 40 mg by mouth daily. (Patient not taking: Reported on 11/19/2022)   No facility-administered encounter medications on file as of 12/16/2023.    Social History   Tobacco Use   Smoking status: Never   Smokeless tobacco: Never  Substance Use Topics   Alcohol use: No   Drug use: Never    {History (Optional):23778}  Review of Systems  Constitutional:  Positive for malaise/fatigue. Negative for diaphoresis, fever and weight loss.  Respiratory:  Positive for shortness of breath. Negative for cough and wheezing.   Cardiovascular:  Negative for chest pain, palpitations, orthopnea, claudication, leg swelling and PND.      Objective:  BP (!) 181/96   Pulse 72   Temp 97.9 F (36.6 C) (Oral)   Resp 18   Ht 5' 8 (1.727 m)   Wt 251 lb (113.9 kg)   SpO2 96%   BMI 38.16 kg/m  {Vitals History (Optional):23777}  Physical Exam Constitutional:      General: He is not in acute distress.    Appearance: Normal appearance. He is obese.     Comments: Comfortable, nondyspneic  HENT:     Head: Normocephalic.  Neck:     Vascular: No carotid bruit.  Cardiovascular:     Rate and Rhythm: Normal rate and regular rhythm.     Pulses: Normal pulses.     Heart sounds: Normal heart sounds.  Pulmonary:     Effort: Pulmonary effort is normal.     Breath  sounds: Normal breath sounds.  Abdominal:     General: Bowel sounds are normal.     Palpations: Abdomen is soft.  Musculoskeletal:     Cervical back: Neck supple. No tenderness.     Right lower leg: Edema present.     Left lower leg: Edema present.     Comments: Trace-1+ bilateral LE edema  Neurological:     Mental Status: He is alert.      No results found for any visits on 12/16/23.  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  Will alert his Cardiology team to our concerns and request they see him pretty soon.  DASH diet.   Morbid obesity (HCC) Assessment & Plan: Exercise capacity is rather limited.  Work harder on weight loss.   Primary hypertension Assessment & Plan: Uncontrolled.  Obviously continue his beta blocker.  Add Amlodipine for now, until Cardiology can adjust his meds per their preference.  Orders: -     amLODIPine Besylate; Take 1 tablet (5 mg total) by mouth daily.  Dispense: 30 tablet; Refill: 1  Glucose intolerance Assessment & Plan: Reevaluate this and other issues once Cardiac status is updated.   Stage 3a chronic kidney disease (HCC)    Return in about 4 weeks (around 01/13/2024) for chronic follow-up.    REDDING PONCE NORLEEN FALCON., MD

## 2023-12-16 NOTE — Assessment & Plan Note (Signed)
 Exercise capacity is rather limited.  Work harder on weight loss.

## 2023-12-16 NOTE — Assessment & Plan Note (Signed)
 Reevaluate this and other issues once Cardiac status is updated.

## 2023-12-21 NOTE — Progress Notes (Signed)
 Cardiology Office Note:  .   Date:  01/03/2024  ID:  Fairy KATHEE Novak, DOB Jul 09, 1940, MRN [id number] PCP: Dottie Norleen PHEBE PONCE, MD  [facility] Providers Cardiologist:  Stanly DELENA Leavens, MD Electrophysiologist:  Lynwood Rakers, MD (Inactive)    History of Present Illness: .   Karl Peters is a 83 y.o. male  with a history of HFmEF,  CAD, HLD, PVCs, and DMII. Of note he is intolerant jardiance.    Admitted 10/23/22 with A/C HFrEF. He had been off medications for 2 1/2 week due to nausea and vomiting. Diuresed with IV lasix  and transitioned to lasix  40 mg daily. Overall diuresed 12 pounds. Entresto  stopped due to hypotension and hyperkalemia. Placed on Toprol  XL for GDMT. Discharge weight 247 pounds.   Patient seen by PCP 12/16/23 with compensated systolic CHF but recommended f/u with Cards and echo because of DOE and fatigue with exertion. BP was up and amlodipine added. Patient says he does fine walking a slow pace on level ground but going up a hill he has to stop because of DOE. Has been going on since last year, but seems to get worse with age. Has had more swelling. Getting extra salt in his diet-canned soup, sausage biscuit...  ROS:    Studies Reviewed: SABRA         Prior CV Studies:    Echo 10/2022  Poor image quality EF based on images with definity . EF improved  compared to TTE done 04/11/21 Global hypokinesis abnormal septal motion .  Left ventricular ejection fraction, by estimation, is 40 to 45%. The left  ventricle has mildly decreased  function. The left ventricle demonstrates global hypokinesis. The left  ventricular internal cavity size was moderately dilated. Left ventricular  diastolic parameters are consistent with Grade II diastolic dysfunction  (pseudonormalization). Elevated left  ventricular end-diastolic pressure.   2. Right ventricular systolic function is normal. The right ventricular  size is normal.   3. Left atrial size was moderately dilated.   4.  The mitral valve is abnormal. No evidence of mitral valve  regurgitation. No evidence of mitral stenosis. Moderate mitral annular  calcification.   5. The aortic valve is tricuspid. Aortic valve regurgitation is not  visualized. No aortic stenosis is present.    Echo 2023  1. Global hypokinesis worse in the inferoseptal, inferior and  inferolateral myocarium. Left ventricular ejection fraction, by  estimation, is 25 to 30%. The left ventricle has severely decreased  function. The left ventricle demonstrates global  hypokinesis. There is mild concentric left ventricular hypertrophy.   2. Right ventricular systolic function is normal. The right ventricular  size is normal.   3. Left atrial size was moderately dilated.   4. The mitral valve is normal in structure. No evidence of mitral valve  regurgitation. No evidence of mitral stenosis. Moderate mitral annular  calcification.   5. The aortic valve is tricuspid. Aortic valve regurgitation is not  visualized. No aortic stenosis is present.   6. Aortic dilatation noted. There is borderline dilatation of the  ascending aorta, measuring 36 mm.    LHC 2022  No significant obstructive coronary disease in the major arteries.   LAD with segmental 50% mid stenosis.  Second diagonal with 70% ostial to proximal narrowing.  Vessel covers a very small territory and bifurcates proximally.   Codominant widely patent RCA   30% proximal ramus intermedius.   Codominant right coronary without significant obstructi  Risk Assessment/Calculations:  HYPERTENSION CONTROL Vitals:   01/03/24 1046 01/03/24 1130  BP: (!) 152/76 (!) 140/90    The patient's blood pressure is elevated above target today.  In order to address the patient's elevated BP: Blood pressure will be monitored at home to determine if medication changes need to be made.; A current anti-hypertensive medication was adjusted today.; Follow up with primary care provider for management.           Physical Exam:   VS:  BP (!) 140/90   Pulse 69   Ht 5' 8 (1.727 m)   Wt 244 lb (110.7 kg)   SpO2 97%   BMI 37.10 kg/m    Orhtostatics: No data found. Wt Readings from Last 3 Encounters:  01/03/24 244 lb (110.7 kg)  12/16/23 251 lb (113.9 kg)  09/14/23 239 lb 3.2 oz (108.5 kg)    GEN: Obese, in no acute distress NECK: No JVD; No carotid bruits CARDIAC:  RRR, no murmurs, rubs, gallops RESPIRATORY:  Clear to auscultation without rales, wheezing or rhonchi  ABDOMEN: Soft, non-tender, non-distended EXTREMITIES:  No edema; No deformity   ASSESSMENT AND PLAN: .    Chronic HFrEF/NICM  Echo 10/2022 showed EF improved to EF 40-45% from 25-30% .  LHC - 2022 - no significant CAD:  LAD with segmental 50% mid stenosis.  Second diagonal with 70% ostial to proximal narrowing.  Vessel covers a very small territory and bifurcates proximally. RCA patent,  GDMT  Demadex 40 mg daily.   BB- Increase Toprol  XL 50 mg mg daily    Ace/ARB/ARNI- hold for now ckd MRA- hold for now with ckd SGLT2i- Intolerant due to nausea.  LE edema/DOE-recheck echo -2 gm sodium diet      CKD Stage IIIa  Recent creatinine 1.52 For labs by PCP in Jan   DMII  Hgb A1C 7.1      History of PVCs /NSVT -torpol xl increased to 50 mg daily    HTN  Elevated.   Toprol  XL increased to 50mg  daily.  Amlodipine 5 mg added by PCP 2 gm sodium diet Check bp daily 2 hrs after he takes his meds and call us  with results after 2 weeks-he just got a new BP cuff  HLD- not on crestor  any longer. Restart and check FLP in Jan by PCP        Dispo: f/u in 3 months Dr. JAYSON  Signed, Olivia Pavy, PA-C

## 2024-01-03 ENCOUNTER — Encounter: Payer: Self-pay | Admitting: Physician Assistant

## 2024-01-03 ENCOUNTER — Ambulatory Visit: Attending: Physician Assistant | Admitting: Physician Assistant

## 2024-01-03 ENCOUNTER — Other Ambulatory Visit (HOSPITAL_BASED_OUTPATIENT_CLINIC_OR_DEPARTMENT_OTHER): Payer: Self-pay

## 2024-01-03 VITALS — BP 140/90 | HR 69 | Ht 68.0 in | Wt 244.0 lb

## 2024-01-03 DIAGNOSIS — E1159 Type 2 diabetes mellitus with other circulatory complications: Secondary | ICD-10-CM

## 2024-01-03 DIAGNOSIS — I4729 Other ventricular tachycardia: Secondary | ICD-10-CM

## 2024-01-03 DIAGNOSIS — I5020 Unspecified systolic (congestive) heart failure: Secondary | ICD-10-CM | POA: Diagnosis not present

## 2024-01-03 DIAGNOSIS — N1831 Chronic kidney disease, stage 3a: Secondary | ICD-10-CM | POA: Diagnosis not present

## 2024-01-03 DIAGNOSIS — I428 Other cardiomyopathies: Secondary | ICD-10-CM | POA: Diagnosis not present

## 2024-01-03 DIAGNOSIS — I10 Essential (primary) hypertension: Secondary | ICD-10-CM

## 2024-01-03 MED ORDER — METOPROLOL SUCCINATE ER 50 MG PO TB24
50.0000 mg | ORAL_TABLET | Freq: Every day | ORAL | 3 refills | Status: AC
  Filled 2024-01-03: qty 90, 90d supply, fill #0

## 2024-01-03 MED ORDER — ROSUVASTATIN CALCIUM 40 MG PO TABS
40.0000 mg | ORAL_TABLET | Freq: Every day | ORAL | 3 refills | Status: AC
  Filled 2024-01-03: qty 90, 90d supply, fill #0

## 2024-01-03 NOTE — Patient Instructions (Addendum)
 Medication Instructions:   START  TAKING : TOPROL  XL  50 MG ONCE  A  DAY     START  TAKING :  CRESTOR  40  MG ONCE  A DAY    *If you need a refill on your cardiac medications before your next appointment, please call your pharmacy*   Lab Work: . RETURN FOR FASTING LABS  LIPIDS IN JANUARY   WITH PRIMARY    If you have labs (blood work) drawn today and your tests are completely normal, you will receive your results only by: MyChart Message (if you have MyChart) OR A paper copy in the mail If you have any lab test that is abnormal or we need to change your treatment, we will call you to review the results.  Testing/Procedures Your physician has requested that you have an echocardiogram. Echocardiography is a painless test that uses sound waves to create images of your heart. It provides your doctor with information about the size and shape of your heart and how well your heart's chambers and valves are working. This procedure takes approximately one hour. There are no restrictions for this procedure. Please do NOT wear cologne, perfume, aftershave, or lotions (deodorant is allowed). Please arrive 15 minutes prior to your appointment time.  Please note: We ask at that you not bring children with you during ultrasound (echo/ vascular) testing. Due to room size and safety concerns, children are not allowed in the ultrasound rooms during exams. Our front office staff cannot provide observation of children in our lobby area while testing is being conducted. An adult accompanying a patient to their appointment will only be allowed in the ultrasound room at the discretion of the ultrasound technician under special circumstances. We apologize for any inconvenience.      Follow-Up: At [facility], you and your health needs are our priority.  As part of our continuing mission to provide you with exceptional heart care, our providers are all part of one team.  This team includes your primary  Cardiologist (physician) and Advanced Practice Providers or APPs (Physician Assistants and Nurse Practitioners) who all work together to provide you with the care you need, when you need it.  Your next appointment:   3 -4 month(s)   Provider:   Stanly DELENA Leavens, MD    We recommend signing up for the patient portal called MyChart.  Sign up information is provided on this After Visit Summary.  MyChart is used to connect with patients for Virtual Visits (Telemedicine).  Patients are able to view lab/test results, encounter notes, upcoming appointments, etc.  Non-urgent messages can be sent to your provider as well.   To learn more about what you can do with MyChart, go to [email or web address].   Other Instructions

## 2024-02-01 ENCOUNTER — Other Ambulatory Visit (HOSPITAL_BASED_OUTPATIENT_CLINIC_OR_DEPARTMENT_OTHER): Payer: Self-pay | Admitting: Family Medicine

## 2024-02-01 ENCOUNTER — Other Ambulatory Visit (HOSPITAL_BASED_OUTPATIENT_CLINIC_OR_DEPARTMENT_OTHER): Payer: Self-pay

## 2024-02-02 ENCOUNTER — Other Ambulatory Visit (HOSPITAL_BASED_OUTPATIENT_CLINIC_OR_DEPARTMENT_OTHER): Payer: Self-pay

## 2024-02-02 MED ORDER — CYANOCOBALAMIN 1000 MCG/ML IJ SOLN
INTRAMUSCULAR | 1 refills | Status: AC
  Filled 2024-03-08: qty 3, 90d supply, fill #0

## 2024-02-11 ENCOUNTER — Ambulatory Visit (HOSPITAL_COMMUNITY)

## 2024-03-08 ENCOUNTER — Other Ambulatory Visit (HOSPITAL_BASED_OUTPATIENT_CLINIC_OR_DEPARTMENT_OTHER): Payer: Self-pay | Admitting: Student

## 2024-03-08 ENCOUNTER — Other Ambulatory Visit (HOSPITAL_BASED_OUTPATIENT_CLINIC_OR_DEPARTMENT_OTHER): Payer: Self-pay

## 2024-03-09 ENCOUNTER — Other Ambulatory Visit (HOSPITAL_BASED_OUTPATIENT_CLINIC_OR_DEPARTMENT_OTHER): Payer: Self-pay | Admitting: *Deleted

## 2024-03-09 ENCOUNTER — Other Ambulatory Visit (HOSPITAL_BASED_OUTPATIENT_CLINIC_OR_DEPARTMENT_OTHER): Payer: Self-pay

## 2024-03-09 DIAGNOSIS — F39 Unspecified mood [affective] disorder: Secondary | ICD-10-CM

## 2024-03-09 MED ORDER — BUPROPION HCL ER (XL) 300 MG PO TB24
300.0000 mg | ORAL_TABLET | Freq: Every morning | ORAL | 0 refills | Status: AC
  Filled 2024-03-09: qty 30, 30d supply, fill #0

## 2024-03-09 MED ORDER — BUPROPION HCL ER (XL) 300 MG PO TB24
300.0000 mg | ORAL_TABLET | Freq: Every morning | ORAL | 0 refills | Status: DC
  Filled 2024-03-09: qty 30, 30d supply, fill #0

## 2024-03-15 LAB — LAB REPORT - SCANNED: EGFR: 46

## 2024-03-16 ENCOUNTER — Encounter: Payer: Self-pay | Admitting: Internal Medicine

## 2024-03-17 ENCOUNTER — Encounter (HOSPITAL_BASED_OUTPATIENT_CLINIC_OR_DEPARTMENT_OTHER): Payer: Self-pay | Admitting: Family Medicine

## 2024-03-17 ENCOUNTER — Other Ambulatory Visit (HOSPITAL_BASED_OUTPATIENT_CLINIC_OR_DEPARTMENT_OTHER): Payer: Self-pay

## 2024-03-17 ENCOUNTER — Ambulatory Visit (INDEPENDENT_AMBULATORY_CARE_PROVIDER_SITE_OTHER): Admitting: Family Medicine

## 2024-03-17 VITALS — BP 150/72 | HR 68 | Temp 97.5°F | Resp 16 | Wt 255.5 lb

## 2024-03-17 DIAGNOSIS — J209 Acute bronchitis, unspecified: Secondary | ICD-10-CM

## 2024-03-17 DIAGNOSIS — R5383 Other fatigue: Secondary | ICD-10-CM | POA: Diagnosis not present

## 2024-03-17 DIAGNOSIS — I10 Essential (primary) hypertension: Secondary | ICD-10-CM

## 2024-03-17 DIAGNOSIS — E7439 Other disorders of intestinal carbohydrate absorption: Secondary | ICD-10-CM

## 2024-03-17 MED ORDER — AZITHROMYCIN 250 MG PO TABS
ORAL_TABLET | ORAL | 0 refills | Status: AC
  Filled 2024-03-17: qty 6, 5d supply, fill #0

## 2024-03-17 NOTE — Assessment & Plan Note (Signed)
 Orders:    Hemoglobin A1c

## 2024-03-17 NOTE — Progress Notes (Unsigned)
 "  Established Patient Office Visit  Subjective   Patient ID: Karl Peters, male    DOB: 01-02-1941  Age: 84 y.o. MRN: [id number]  Chief Complaint  Patient presents with   Follow-up    Follow-up    Discussed the use of AI scribe software for clinical note transcription with the patient, who gave verbal consent to proceed.  History of Present Illness Karl Peters is an 84 year old male with a history of congestive heart failure who presents with a persistent cough.  He has experienced a persistent cough since the Tuesday before Christmas, approximately five weeks ago. The cough is more pronounced when lying down to sleep, necessitating the use of cough drops for management. Initially, he could only sleep by sitting up. During the day, the cough has improved but was previously affecting him about 50% of the time.  He denies any current heartburn or reflux symptoms, stating he has not experienced reflux in about 25 years. He is not producing any phlegm but has had green-yellow nasal discharge, which has decreased to a small amount.  He feels fatigued most of the time, attributing it to the illness that began in December. He recalls a similar illness last year around Christmas, which took about two months to resolve.  He receives regular B12 shots, which he recently renewed.    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 Tobie   Dyslipidemia    GERD (gastroesophageal reflux disease)    Glucose intolerance    Hypertension    Obesity    Osteoarthritis    Pernicious anemia    PVCs (premature ventricular contractions)     Outpatient Encounter Medications as of 03/17/2024  Medication Sig   amLODipine  (NORVASC ) 5 MG tablet Take 1 tablet (5 mg total) by  mouth daily.   aspirin  EC 81 MG tablet Take 1 tablet (81 mg total) by mouth daily. Swallow whole.   azithromycin  (ZITHROMAX ) 250 MG tablet Take 2 tablets (500 mg total) by mouth daily for 1 day, THEN 1 tablet (250 mg total) daily for 4 days.   buPROPion  (WELLBUTRIN  XL) 300 MG 24 hr tablet Take 1 tablet (300 mg total) by mouth every morning.   Cholecalciferol (VITAMIN D-3 PO) Take by mouth.   cyanocobalamin  (VITAMIN B12) 1000 MCG/ML injection Inject 1 ml (1,000 mcg) into the muscle every week for 4 weeks, then Inject 1 mL (1,000 mcg total) into the muscle every 30 (thirty) days   furosemide  (LASIX ) 40 MG tablet TAKE 1 TABLET BY MOUTH TWICE A DAY   metoprolol  succinate (TOPROL -XL) 50 MG 24 hr tablet Take 1 tablet (50 mg total) by mouth daily. TAKE WITH OR IMMEDIATELY FOLLOWING A MEAL.   rosuvastatin  (CRESTOR ) 40 MG tablet Take 1 tablet (40 mg total) by mouth daily.   torsemide  (DEMADEX ) 20 MG tablet Take 2 tablets (40 mg total) by mouth daily. *stop furosemide *   No facility-administered encounter medications on file as of 03/17/2024.    Social History   Tobacco Use   Smoking status: Never   Smokeless tobacco: Never  Substance Use Topics   Alcohol use: No   Drug use: Never      Review of Systems  Constitutional:  Negative for  diaphoresis, fever, malaise/fatigue and weight loss.  Respiratory:  Negative for cough, shortness of breath and wheezing.   Cardiovascular:  Negative for chest pain, palpitations, orthopnea, claudication, leg swelling and PND.      Objective:     BP (!) 150/72 (BP Location: Right Arm, Patient Position: Standing, Cuff Size: Normal)   Pulse 68   Temp (!) 97.5 F (36.4 C) (Oral)   Resp 16   Wt 255 lb 8 oz (115.9 kg)   SpO2 96%   BMI 38.85 kg/m    Physical Exam Constitutional:      General: He is not in acute distress.    Appearance: He is not ill-appearing, toxic-appearing or diaphoretic.  HENT:     Right Ear: Tympanic membrane normal.     Left  Ear: Tympanic membrane normal.     Nose: Congestion present. No rhinorrhea.     Right Sinus: No maxillary sinus tenderness or frontal sinus tenderness.     Left Sinus: No maxillary sinus tenderness or frontal sinus tenderness.     Mouth/Throat:     Pharynx: Posterior oropharyngeal erythema present. No oropharyngeal exudate.  Eyes:     Conjunctiva/sclera: Conjunctivae normal.  Cardiovascular:     Rate and Rhythm: Normal rate and regular rhythm.     Heart sounds: Normal heart sounds.  Pulmonary:     Breath sounds: Normal breath sounds.  Abdominal:     Palpations: Abdomen is soft.     Tenderness: There is no abdominal tenderness.  Musculoskeletal:     Cervical back: Normal range of motion and neck supple. No tenderness.  Lymphadenopathy:     Cervical: No cervical adenopathy.  Skin:    General: Skin is warm and dry.     Findings: No rash.  Neurological:     Mental Status: He is alert.      No results found for any visits on 03/17/24.    The ASCVD Risk score (Arnett DK, et al., 2019) failed to calculate for the following reasons:   The 2019 ASCVD risk score is only valid for ages 30 to 68   * - Cholesterol units were assumed    Assessment & Plan:   Assessment & Plan Subacute bronchitis Persistent cough for over a month, more pronounced at night, with recent improvement in daytime symptoms. No active reflux symptoms. Green-yellow nasal discharge has decreased. Likely bacterial etiology given duration and symptoms. - Prescribed azithromycin  for suspected bacterial infection. - Advised to report if cough does not improve substantially in the next week. Orders:   azithromycin  (ZITHROMAX ) 250 MG tablet; Take 2 tablets (500 mg total) by mouth daily for 1 day, THEN 1 tablet (250 mg total) daily for 4 days.  Fatigue, unspecified type Likely related to prolonged illness since December. Regular B12 injections may help improve energy levels. - Continue regular B12  injections.  Orders:   TSH  Glucose intolerance 1800 ADA diet. Orders:   Hemoglobin A1c  Essential hypertension Blood pressure slightly elevated today. Recent reading at nephrologist's office was 130/90 mmHg, which is acceptable. - Continue Toprol  and DASH diet.    Morbid obesity (HCC) Comorbidities include HTN, Lipids, etc. Discussed the importance of maintaining activity levels. - Encouraged regular physical activity as tolerated.        Return in about 6 months (around 09/14/2024) for physical.    DOTTIE PONCE NORLEEN FALCON., MD  "

## 2024-03-18 LAB — HEMOGLOBIN A1C
Est. average glucose Bld gHb Est-mCnc: 151 mg/dL
Hgb A1c MFr Bld: 6.9 % — ABNORMAL HIGH (ref 4.8–5.6)

## 2024-03-18 LAB — TSH: TSH: 4.07 u[IU]/mL (ref 0.450–4.500)

## 2024-03-18 NOTE — Assessment & Plan Note (Signed)
 Blood pressure slightly elevated today. Recent reading at nephrologist's office was 130/90 mmHg, which is acceptable. - Continue Toprol  and DASH diet.

## 2024-03-18 NOTE — Assessment & Plan Note (Signed)
 Comorbidities include HTN, Lipids, etc. Discussed the importance of maintaining activity levels. - Encouraged regular physical activity as tolerated.

## 2024-03-20 ENCOUNTER — Ambulatory Visit (HOSPITAL_BASED_OUTPATIENT_CLINIC_OR_DEPARTMENT_OTHER): Payer: Self-pay | Admitting: Family Medicine

## 2024-03-23 ENCOUNTER — Ambulatory Visit: Admitting: Internal Medicine

## 2024-09-14 ENCOUNTER — Encounter (HOSPITAL_BASED_OUTPATIENT_CLINIC_OR_DEPARTMENT_OTHER): Admitting: Family Medicine
